# Patient Record
Sex: Male | Born: 1960 | Race: White | Hispanic: No | Smoking: Current some day smoker
Health system: Southern US, Community
[De-identification: ages and names within clinical notes are randomized; demographics above are authoritative.]

## PROBLEM LIST (undated history)

## (undated) DIAGNOSIS — R109 Unspecified abdominal pain: Secondary | ICD-10-CM

## (undated) DIAGNOSIS — M7710 Lateral epicondylitis, unspecified elbow: Secondary | ICD-10-CM

## (undated) DIAGNOSIS — T4145XA Adverse effect of unspecified anesthetic, initial encounter: Secondary | ICD-10-CM

## (undated) DIAGNOSIS — K831 Obstruction of bile duct: Secondary | ICD-10-CM

## (undated) DIAGNOSIS — I219 Acute myocardial infarction, unspecified: Secondary | ICD-10-CM

## (undated) DIAGNOSIS — F419 Anxiety disorder, unspecified: Secondary | ICD-10-CM

## (undated) DIAGNOSIS — C3490 Malignant neoplasm of unspecified part of unspecified bronchus or lung: Secondary | ICD-10-CM

## (undated) DIAGNOSIS — T8859XA Other complications of anesthesia, initial encounter: Secondary | ICD-10-CM

## (undated) DIAGNOSIS — R079 Chest pain, unspecified: Secondary | ICD-10-CM

## (undated) DIAGNOSIS — F17200 Nicotine dependence, unspecified, uncomplicated: Secondary | ICD-10-CM

## (undated) DIAGNOSIS — D539 Nutritional anemia, unspecified: Principal | ICD-10-CM

## (undated) DIAGNOSIS — R0602 Shortness of breath: Secondary | ICD-10-CM

## (undated) DIAGNOSIS — C259 Malignant neoplasm of pancreas, unspecified: Secondary | ICD-10-CM

## (undated) HISTORY — DX: Chest pain, unspecified: R07.9

## (undated) HISTORY — DX: Unspecified abdominal pain: R10.9

## (undated) HISTORY — DX: Nutritional anemia, unspecified: D53.9

## (undated) HISTORY — DX: Obstruction of bile duct: K83.1

## (undated) HISTORY — DX: Malignant neoplasm of pancreas, unspecified: C25.9

## (undated) HISTORY — PX: KNEE ARTHROSCOPY: SUR90

## (undated) HISTORY — DX: Lateral epicondylitis, unspecified elbow: M77.10

## (undated) HISTORY — PX: CHOLECYSTECTOMY: SHX55

## (undated) HISTORY — DX: Nicotine dependence, unspecified, uncomplicated: F17.200

## (undated) HISTORY — DX: Malignant neoplasm of unspecified part of unspecified bronchus or lung: C34.90

## (undated) HISTORY — PX: OTHER SURGICAL HISTORY: SHX169

---

## 2003-07-15 ENCOUNTER — Encounter: Payer: Self-pay | Admitting: *Deleted

## 2003-07-15 ENCOUNTER — Observation Stay (HOSPITAL_COMMUNITY): Admission: EM | Admit: 2003-07-15 | Discharge: 2003-07-17 | Payer: Self-pay | Admitting: Emergency Medicine

## 2003-07-17 ENCOUNTER — Encounter: Payer: Self-pay | Admitting: Cardiology

## 2005-11-04 DIAGNOSIS — I219 Acute myocardial infarction, unspecified: Secondary | ICD-10-CM

## 2005-11-04 HISTORY — DX: Acute myocardial infarction, unspecified: I21.9

## 2005-11-04 HISTORY — PX: WHIPPLE PROCEDURE: SHX2667

## 2006-08-05 ENCOUNTER — Ambulatory Visit: Admission: RE | Admit: 2006-08-05 | Discharge: 2006-10-02 | Payer: Self-pay | Admitting: *Deleted

## 2006-09-18 ENCOUNTER — Ambulatory Visit: Payer: Self-pay | Admitting: Cardiology

## 2006-09-23 ENCOUNTER — Ambulatory Visit: Payer: Self-pay | Admitting: Cardiology

## 2006-10-21 ENCOUNTER — Ambulatory Visit: Payer: Self-pay | Admitting: Cardiology

## 2007-04-07 ENCOUNTER — Ambulatory Visit (HOSPITAL_COMMUNITY): Admission: RE | Admit: 2007-04-07 | Discharge: 2007-04-07 | Payer: Self-pay | Admitting: Internal Medicine

## 2007-09-18 ENCOUNTER — Ambulatory Visit (HOSPITAL_COMMUNITY): Admission: RE | Admit: 2007-09-18 | Discharge: 2007-09-18 | Payer: Self-pay | Admitting: Internal Medicine

## 2011-03-18 ENCOUNTER — Encounter: Payer: Self-pay | Admitting: Cardiology

## 2011-03-22 NOTE — Discharge Summary (Signed)
NAME:  Patrick Lee, Patrick Lee                          ACCOUNT NO.:  [id number]   MEDICAL RECORD NO.:  [id number]                   PATIENT TYPE:  INP   LOCATION:  4728                                 FACILITY:  [facility]   PHYSICIAN:  Learta Codding, M.D.                 DATE OF BIRTH:  November 06, 1960   DATE OF ADMISSION:  07/15/2003  DATE OF DISCHARGE:  07/17/2003                           DISCHARGE SUMMARY - REFERRING   HOSPITAL COURSE:  The patient is a 50 year old male patient with a known  history of coronary artery disease who complains of substernal chest pain  and pressure when just sitting.  He also noted accompanying dyspnea.  He  took a Advertising account executive and the pain stopped.  The pain lasted approximately 10  minutes.  Afterwards, he has noted chest aching overnight.  He was seen at  [facility] and was administered sublingual nitroglycerin, and  this relieved the pain.  He now complains of weakness, but does complain of  slight aching with a 2/10 chest pain.  He currently denies any shortness of  breath, PND, orthopnea, or palpitations.   ALLERGIES:  No known drug allergies.   MEDICATIONS:  None.   PAST MEDICAL HISTORY:  Illicit substance use with lateral epicondylitis.   SOCIAL HISTORY:  He lives in __________ with his wife.  He works as a  Pensions consultant.  He is married with four children.  He smokes one pack for day  for the past 20 years.  No regular exercise.  Rare alcohol use.  Regular  diet.  He does smoke marijuana, and his last use was two to three days ago.   FAMILY HISTORY:  Mom at age 43, alive and well.  Father at age 24 disabled  secondary to his shoulder.  He did have a grandfather with coronary artery  disease.  He has two brothers and one sister alive and well.   The patient was transferred to [facility] for further cardiac  workup including a rest-stress Cardiolite which revealed no evidence of  fixed or reversible defect.  EF was 56%.   LABORATORY DATA:  Labs done during the patient's hospital stay include white  count 7.3, hemoglobin 15.5, hematocrit 45.4, platelets 219.  D-Dimer less  than 0.22.  Sodium 140, potassium 4.0, BUN 14, creatinine 1.0.  Total  cholesterol 207.  Triglycerides  163.  HDL 37, LDL 137.   Chest x-ray revealed no active disease.   The patient was discharged to home on July 17, 2003, in stable  condition.   DISCHARGE INSTRUCTIONS:  1. He was advised to stop smoking.  2. He may utilize Tylenol one to two tablets every 4-6 hours as needed for     pain.  3. Activity as tolerated.  4. A low-fat diet. He does remain on a low-fat diet, and have his     cholesterol rechecked and followed by  Dr. Christell Constant.  5.     He is to call for any questions or concerns.  6. Make a followup appointment with Dr. Christell Constant.  7. I discussed smoking cessation with him.      Guy Franco, P.A. [facility]                      Learta Codding, M.D.    LB/MEDQ  D:  08/03/2003  T:  08/03/2003  Job:  [id number]   cc:   Ernestina Penna, M.D.  [address] [place]  [place] [address]  Fax: [phone]   Learta Codding, M.D.  [address]  [address]  [place]  [place] [address]

## 2011-03-22 NOTE — H&P (Signed)
NAME:  [name], [name]                          ACCOUNT NO.:  [id number]   MEDICAL RECORD NO.:  [id number]                   PATIENT TYPE:  EMS   LOCATION:  MAJO                                 FACILITY:  [facility]   PHYSICIAN:  Learta Codding, M.D.                 DATE OF BIRTH:  1961/03/08   DATE OF ADMISSION:  07/15/2003  DATE OF DISCHARGE:                                HISTORY & PHYSICAL   REFERRING PHYSICIANS:  1. Ernestina Penna, M.D.  2. Dr. Reola Calkins at [facility].   CARDIOLOGIST:  (New) Learta Codding, M.D.   CURRENT COMPLAINT:  Substernal chest pain.   HISTORY OF PRESENT ILLNESS:  [name] is a 50 year old white male with a  history of marijuana use.  The patient has no known coronary artery disease.  He reports new onset of substernal chest pain.  His pain occurred initially  yesterday when he suddenly developed a severe tightness for approximately 10-  15 minutes across the entire precordium.  The patient received __________  with resolution of his pain.  Throughout the remainder of the day, the  patient had no recurrent chest pain.  He did have what he quotes as achiness  overnight.  This morning, he presented himself to [facility] and was given sublingual nitroglycerin.  This apparently resolved  his achiness.  The patient has had no further chest pain throughout.  He  denies shortness of breath, orthopnea, PND, palpitations, or syncope.  He  did say that the pain yesterday almost made him feel like he was going to  pass out because it was so severe.  He also reports some weakness.  His  electrocardiogram was fairly unremarkable on admission.   ALLERGIES:  No known drug allergies.   MEDICATIONS:  None.   PAST MEDICAL HISTORY:  1. History of illicit substance abuse.  2. History of lateral epicondylitis.   SOCIAL HISTORY:  The patient lives in [place], [place], with his  wife.  He is an Personnel officer.  He has  married and has four children.  He uses  marijuana on a daily basis.  He denies cocaine use.  The does not exercise.   FAMILY HISTORY:  Notable for mother alive and well at 28, as well as his  father at age 74.   REVIEW OF SYSTEMS:  No fever or chills.  No orthopnea or PND.  Positive for  chest pain as outlined above.  No frequency or dysuria.  No myalgias or  arthralgias.  No nausea or vomiting.   PHYSICAL EXAMINATION:  VITAL SIGNS:  The blood pressure is 111/70, the heart  rate is 68 beats per minute, the temperature is 97.6 degrees, and saturation  is 100% on room air.  GENERAL APPEARANCE:  A white male in no apparent distress.  HEENT:  PERRLA.  EOMI.  NECK:  Supple.  Normal carotid upstroke.  No carotid bruits.  LUNGS:  Diminished breath sounds.  HEART:  Regular rate and rhythm.  Normal sinus rhythm.  No rubs or gallops.  ABDOMEN:  Soft and nontender.  No rebound or guarding.  Good bowel sounds.  GENITOURINARY:  Deferred.  RECTAL:  Deferred.  EXTREMITIES:  No cyanosis, clubbing, or edema.  NEUROLOGIC:  The patient is alert and oriented and grossly nonfocal.   LABORATORY DATA:  Chest x-ray pending.  EKG:  Sinus rhythm, heart rate 64  beats per minute, nonspecific changes.  Labs pending.   IMPRESSION:  1. Rule out acute coronary syndrome.  The patient does have risk factors for     coronary artery disease, however, there are some typical and atypical     features to his chest pain syndrome.  The patient will be started on     aspirin and heparin.  However, if his enzymes remain negative, I would be     first inclined to proceed with exercise Cardiolite study.  If this study     is abnormal, the patient should proceed with a cardiac catheterization.  2. Substance abuse as outlined above.   DISPOSITION:  The patient will be admitted and ruled out for myocardial  infarction.  Consider cardiac catheterization if recurrent pain or positive  enzymes.                                                 Learta Codding, M.D.    GED/MEDQ  D:  07/15/2003  T:  07/15/2003  Job:  [id number]

## 2011-03-22 NOTE — Assessment & Plan Note (Signed)
[facility]                          [facility] NOTE   Patrick Lee, Patrick Lee                       MRN:          [id number]  DATE:10/21/2006                            DOB:          03/11/1961    HISTORY OF PRESENT ILLNESS:  The patient is a 50 year old male with a  history of pancreatic cancer, status post Whipple procedure.  The  patient was seen in the office November 18, 2005 with a specific  questions regarding concerns of a perioperative myocardial infarction.  The patient in the interim has had no substernal chest pain.  He also is  status post radiation therapy.  My recommendation was to obtain an  echocardiograph study but if this demonstrated normal LV function, not  to proceed with stress testing in the absence of symptoms.   The patient's echocardiographic study demonstrated indeed a normal LV  function.  There was some evidence of hypokinesis of the inferior wall  and anterior septum.  The patient however has remained asymptomatic and  from a cardiac prospective has been doing well.   MEDICATIONS:  1. Prevacid 30 mg daily.  2. Aspirin 325 daily.  3. Toprol 50 b.i.d.  4. Previous chemotherapy.   PHYSICAL EXAMINATION:  VITAL SIGNS:  Blood pressure is 98/66.  Heart  rate is 74.  Weight is 175 pounds.  NECK:  no JVD, nl carotid upstroke  LUNGS:  Clear.  HEART:  Is regular rhythm.  ABDOMEN:  Soft.  EXTREMITIES:  No edema.   PROBLEM:  1. Pancreatic cancer status post Whipple procedure.  2. Questionable perioperative myocardial infarction but normal left      ventricular function on follow up echo.  3. Asymptomatic from prospective of angina.  4. Cardiac risk factors.   PLAN:  1. The patient has remained asymptomatic from a cardiovascular      prospective.  He has no LV function on echocardiography.  2. At this point, in the absence of symptoms, I do not think the      patient needs to proceed with      ischemia testing.   He can follow up on a routine basis.  I will      notify the patient by e-mail and he can ask further questions on      line.     Learta Codding, MD,FACC  Electronically Signed    GED/MedQ  DD: 10/21/2006  DT: 10/21/2006  Job #: [id number]

## 2011-03-22 NOTE — Assessment & Plan Note (Signed)
[facility]                            [facility] C[facility] OFFICE NOTE   ENDI, Patrick Lee                       MRN:          [id number]  DATE:09/18/2006                            DOB:          July 30, 1961    REFERRING PHYSICIAN:  Ernestina Penna, M.D.   REASON FOR CONSULTATION:  Evaluation of a 50 year old male with pancreatic  cancer, complicated by perioperative myocardial infarction.   HISTORY OF PRESENT ILLNESS:  The patient is a 50 year old male status post  pancreatic cancer, requiring Whipple procedure at [facility] several weeks ago.  The patient reportedly was told  that he had a small perioperative myocardial infarction.  No further risk  stratification or stress testing was performed due to the patient's acute  illness.  However, he was told that, from a cardiac perspective, his  prognosis was good.  He was referred to his primary care doctors, who have  now referred the patient to our office to further evaluate the patient for  his recent perioperative myocardial infarction.   The patient states that things have been rough.  He is currently  undergoing chemotherapy and is finishing up radiation therapy for his  pancreatic cancer treatment.  He reports no substernal chest pain, however,  or shortness of breath at rest.  He feels at times nausea.  Has had no frank  vomiting.  Has no fever or chills.  Has no palpitations or syncope.  There  is no prior history of exertional angina.   ALLERGIES:  No known drug allergies.   MEDICATIONS:  1. Prevacid 30 mg a day.  2. Aspirin 325 daily.  3. Metoprolol 50 b.i.d.  4. Pancrease tablets 2 tablets t.i.d.  5. Dexamethasone 4 mg 1 tablet p.o. b.i.d. on day 23 post chemo.  6. Chemotherapy once a week and radiation therapy.   FAMILY HISTORY/SOCIAL HISTORY:  Reviewed.  The patient lives in [place].  He  still smokes on occasion.   REVIEW OF SYSTEMS:  As per  HPI.  No fever or chills.  No palpitations.  No  cough, orthopnea, PND.  Positive for heartburn symptoms.  No dysuria or  frequency.  No pruritus.  No problems for weakness and no melena or  hematochezia.  Remainder of review of systems negative.   PHYSICAL EXAMINATION:  VITAL SIGNS:  Blood pressure 102/68, heart rate 72  beats per minute.  NECK:  Normal carotid upstroke and no carotid bruits.  LUNGS:  Clear bilaterally.  HEART:  Regular rate and rhythm with normal S1, S2, and no murmurs, rubs, or  gallops.  ABDOMEN:  Soft and nontender with no rebound or guarding.  Good bowel  sounds.  EXTREMITIES:  No cyanosis, clubbing, or edema.  NEURO:  The patient is alert and oriented, and grossly nonfocal.   PROBLEM LIST:  1. Pancreatic cancer status post Whipple procedure.  2. Possible perioperative myocardial infarction.  3. No symptoms of angina.  4. Cardiac risk factors.   PLAN:  1. The patient will be referred for an echocardiographic study to assess  LV function.  2. In the absence of substernal chest pain or symptomatic cardiovascular      disease, and if the echocardiogram demonstrates normal LV function, I      do not think further stress testing is indicated at this point in time,      in light of the patient's poor prognosis secondary to his underlying      malignancy.  3. I discussed with the patient he needs to continue aspirin and      metoprolol.  4. I will see the patient back in 1 month to discuss test results.     Learta Codding, MD,FACC  Electronically Signed    GED/MedQ  DD: 09/18/2006  DT: 09/18/2006  Job #: [id number]   cc:   Ernestina Penna, M.D.  Lindaann Pascal, [place]

## 2011-03-26 ENCOUNTER — Encounter: Payer: Self-pay | Admitting: Cardiology

## 2011-03-26 ENCOUNTER — Encounter: Payer: Self-pay | Admitting: *Deleted

## 2011-03-26 ENCOUNTER — Ambulatory Visit (INDEPENDENT_AMBULATORY_CARE_PROVIDER_SITE_OTHER): Payer: Medicare Other | Admitting: Cardiology

## 2011-03-26 DIAGNOSIS — R0600 Dyspnea, unspecified: Secondary | ICD-10-CM

## 2011-03-26 DIAGNOSIS — R079 Chest pain, unspecified: Secondary | ICD-10-CM | POA: Insufficient documentation

## 2011-03-26 DIAGNOSIS — R0989 Other specified symptoms and signs involving the circulatory and respiratory systems: Secondary | ICD-10-CM

## 2011-03-26 DIAGNOSIS — R0602 Shortness of breath: Secondary | ICD-10-CM

## 2011-03-26 DIAGNOSIS — F17200 Nicotine dependence, unspecified, uncomplicated: Secondary | ICD-10-CM | POA: Insufficient documentation

## 2011-03-26 NOTE — Assessment & Plan Note (Signed)
I suspect this may be related to tobacco. However, I will exclude obstructive coronary disease with perfusion imaging. He would not be a little walk on a treadmill so he will need pharmacologic testing.  I will also check a BNP level.

## 2011-03-26 NOTE — Assessment & Plan Note (Signed)
This will be evaluated as above. 

## 2011-03-26 NOTE — Progress Notes (Signed)
HPI The patient presents for evaluation of shortness of breath and chest discomfort. He was seen here several years ago after possibly having a non-Q wave myocardial infarction following Whipple surgery. I do see an echocardiogram which demonstrated a preserved ejection fraction with some possible inferior wall and anterior septal hypokinesis. He was to have a stress test but because of all he was going through apparently he didn't have this.  Since his surgery he continued symptoms shortness of breath with minimal activity. He thinks that this has been slightly worse over time. He can walk level ground at about 50 yards to climb half a flight of stairs and then he has to stop to catch his breath. He does have some tightness in his chest when this happens. He will rest and this will go away. He doesn't describe neck or arm discomfort. He doesn't describe PND or orthopnea. He has had no palpitations, presyncope or syncope. He has significant fatigue limiting his activities.  No Known Allergies  Current Outpatient Prescriptions  Medication Sig Dispense Refill  . aspirin 325 MG tablet Take 325 mg by mouth daily.        Marland Kitchen escitalopram (LEXAPRO) 10 MG tablet Take 10 mg by mouth daily.        Marland Kitchen HYDROcodone-acetaminophen (VICODIN) 5-500 MG per tablet Take 1 tablet by mouth 2 (two) times daily.        . nitroGLYCERIN (NITROSTAT) 0.4 MG SL tablet Place 0.4 mg under the tongue every 5 (five) minutes as needed.          Past Medical History  Diagnosis Date  . Lateral epicondylitis   . Chest pain, unspecified   . Abdominal  pain, other specified site   . Obstruction of bile duct   . Tobacco use disorder   . Pancreatic cancer      status post Whipple procedure.    Past Surgical History  Procedure Date  . Whipple procedure   . Percutaneous extraction of gallstones     Family History  Problem Relation Age of Onset  . Stroke Father 10    Deceased    History   Social History  . Marital  Status: Married    Spouse Name: N/A    Number of Children: 4  . Years of Education: N/A   Occupational History  . UNEMPLOYED    Social History Main Topics  . Smoking status: Current Everyday Smoker -- 1.0 packs/day for 28 years    Types: Cigarettes  . Smokeless tobacco: Never Used  . Alcohol Use: Yes      Rare alcohol use  . Drug Use: Not on file     History of illicit substance abuse  . Sexually Active: Not on file   Other Topics Concern  . Not on file   Social History Narrative  . No narrative on file    ROS:  Positive for a 30 pound weight loss, tinnitus, hearing loss, depression, anxiety, weakness, dizziness, stomach aches, joint pains. Otherwise as stated in the history of present illness negative for all other systems.  PHYSICAL EXAM BP 126/83  Pulse 58  Ht 6' (1.829 m)  Wt 151 lb (68.493 kg)  BMI 20.48 kg/m2  SpO2 100% GENERAL:  Very thin HEENT:  Pupils equal round and reactive, fundi not visualized, oral mucosa unremarkable, dentures NECK:  No jugular venous distention, waveform within normal limits, carotid upstroke brisk and symmetric, no bruits, no thyromegaly LYMPHATICS:  No cervical, inguinal adenopathy LUNGS:  Clear to  auscultation bilaterally, decreased breath sounds BACK:  No CVA tenderness CHEST:  Unremarkable HEART:  PMI not displaced or sustained,S1 and S2 within normal limits, no S3, no S4, no clicks, no rubs, no murmurs ABD:  Flat, positive bowel sounds normal in frequency in pitch, no bruits, no rebound, no guarding, no midline pulsatile mass, no hepatomegaly, no splenomegaly, surgical scars. EXT:  2 plus pulses throughout, no edema, no cyanosis no clubbing SKIN:  No rashes no nodules NEURO:  Cranial nerves II through XII grossly intact, motor grossly intact throughout PSYCH:  Cognitively intact, oriented to person place and time   EKG:  Sinus rhythm, rate 59, axis within normal limits, intervals within normal limits, no acute ST-T wave  changes.  ASSESSMENT AND PLAN

## 2011-03-26 NOTE — Patient Instructions (Addendum)
   Lexiscan stress test  If the results of your test are normal or stable, you will receive a letter.  If they are abnormal, the nurse will contact you by phone. Your physician recommends that you also have lab for BNP.  Can do this on same day as test above. Follow up:  Wednesday, June 13 at 3:00 with Dr. Antoine Poche in [place] office.

## 2011-03-26 NOTE — Assessment & Plan Note (Signed)
We discussed this at length. He does have a history of depression so I will defer adding Chantix. However, I asked him to discuss this with his primary providers.

## 2011-03-27 ENCOUNTER — Other Ambulatory Visit: Payer: Self-pay | Admitting: *Deleted

## 2011-03-27 DIAGNOSIS — R0602 Shortness of breath: Secondary | ICD-10-CM

## 2011-04-10 DIAGNOSIS — R072 Precordial pain: Secondary | ICD-10-CM

## 2011-04-17 ENCOUNTER — Ambulatory Visit (INDEPENDENT_AMBULATORY_CARE_PROVIDER_SITE_OTHER): Payer: Medicare Other | Admitting: Cardiology

## 2011-04-17 ENCOUNTER — Encounter: Payer: Self-pay | Admitting: Cardiology

## 2011-04-17 DIAGNOSIS — F17200 Nicotine dependence, unspecified, uncomplicated: Secondary | ICD-10-CM

## 2011-04-17 DIAGNOSIS — R0600 Dyspnea, unspecified: Secondary | ICD-10-CM

## 2011-04-17 DIAGNOSIS — R0609 Other forms of dyspnea: Secondary | ICD-10-CM

## 2011-04-17 DIAGNOSIS — R079 Chest pain, unspecified: Secondary | ICD-10-CM

## 2011-04-17 DIAGNOSIS — I429 Cardiomyopathy, unspecified: Secondary | ICD-10-CM | POA: Insufficient documentation

## 2011-04-17 DIAGNOSIS — R0602 Shortness of breath: Secondary | ICD-10-CM

## 2011-04-17 DIAGNOSIS — I428 Other cardiomyopathies: Secondary | ICD-10-CM

## 2011-04-17 DIAGNOSIS — Z01810 Encounter for preprocedural cardiovascular examination: Secondary | ICD-10-CM

## 2011-04-17 NOTE — Assessment & Plan Note (Signed)
No further ischemia workup is needed. He needs primary risk reduction.

## 2011-04-17 NOTE — Assessment & Plan Note (Signed)
The patient will be at acceptable cardiovascular risk for the planned surgery.  I will caution his surgeons to consider that he probably has lung disease with his ongoing tobacco abuse.

## 2011-04-17 NOTE — Assessment & Plan Note (Signed)
He does understand the need to keep working on smoking cessation and I have counseled him on this.

## 2011-04-17 NOTE — Assessment & Plan Note (Signed)
His ejection fraction is said to be slightly low by nuclear study. A BNP level is pending. I will check an echocardiogram to further evaluate.

## 2011-04-17 NOTE — Progress Notes (Signed)
HPI The patient presents for evaluation of shortness of breath and chest discomfort.   He has been short of breath walking 50 feet on level ground.  I sent him for a stress perfusion study at the last visit and a BNP level. He just had a BNP drawn today. He did have a slightly low ejection fraction of 46% but no evidence of ischemia or infarct.  Since that time he has had no new cardiovascular symptoms. He is trying to cut back on his cigarettes. He will occasionally have some low blood pressures and lightheadedness. He's had no new PND or orthopnea. He said no new chest pressure, neck or arm discomfort. He is to have spine surgery.  No Known Allergies  Current Outpatient Prescriptions  Medication Sig Dispense Refill  . aspirin 325 MG tablet Take 325 mg by mouth daily.        Marland Kitchen escitalopram (LEXAPRO) 10 MG tablet Take 10 mg by mouth daily.        . nitroGLYCERIN (NITROSTAT) 0.4 MG SL tablet Place 0.4 mg under the tongue every 5 (five) minutes as needed.        Marland Kitchen DISCONTD: HYDROcodone-acetaminophen (VICODIN) 5-500 MG per tablet Take 1 tablet by mouth 2 (two) times daily.          Past Medical History  Diagnosis Date  . Lateral epicondylitis   . Chest pain, unspecified   . Abdominal  pain, other specified site   . Obstruction of bile duct   . Tobacco use disorder   . Pancreatic cancer      status post Whipple procedure.    Past Surgical History  Procedure Date  . Whipple procedure   . Percutaneous extraction of gallstones     Family History  Problem Relation Age of Onset  . Stroke Father 17    Deceased    History   Social History  . Marital Status: Married    Spouse Name: N/A    Number of Children: 4  . Years of Education: N/A   Occupational History  . UNEMPLOYED    Social History Main Topics  . Smoking status: Current Everyday Smoker -- 1.0 packs/day for 28 years    Types: Cigarettes  . Smokeless tobacco: Never Used  . Alcohol Use: Yes      Rare alcohol use  . Drug  Use: Not on file     History of illicit substance abuse  . Sexually Active: Not on file   Other Topics Concern  . Not on file   Social History Narrative  . No narrative on file    ROS:  Positive for a 30 pound weight loss, tinnitus, hearing loss, depression, anxiety, weakness, dizziness, stomach aches, joint pains. Otherwise as stated in the history of present illness negative for all other systems.  Unchanged from previous note.  PHYSICAL EXAM BP 102/70  Pulse 78  Resp 16  Ht 6' (1.829 m)  Wt 153 lb (69.4 kg)  BMI 20.75 kg/m2 GENERAL:  Very thin HEENT:  Pupils equal round and reactive, fundi not visualized, oral mucosa unremarkable, dentures NECK:  No jugular venous distention, waveform within normal limits, carotid upstroke brisk and symmetric, no bruits, no thyromegaly LYMPHATICS:  No cervical, inguinal adenopathy LUNGS:  Clear to auscultation bilaterally, decreased breath sounds BACK:  No CVA tenderness CHEST:  Unremarkable HEART:  PMI not displaced or sustained,S1 and S2 within normal limits, no S3, no S4, no clicks, no rubs, no murmurs ABD:  Flat, positive bowel  sounds normal in frequency in pitch, no bruits, no rebound, no guarding, no midline pulsatile mass, no hepatomegaly, no splenomegaly, surgical scars. EXT:  2 plus pulses throughout, no edema, no cyanosis no clubbing SKIN:  No rashes no nodules NEURO:  Cranial nerves II through XII grossly intact, motor grossly intact throughout PSYCH:  Cognitively intact, oriented to person place and time   EKG:  Sinus rhythm, rate 59, axis within normal limits, intervals within normal limits, no acute ST-T wave changes.  ASSESSMENT AND PLAN

## 2011-04-17 NOTE — Patient Instructions (Addendum)
You are being scheduled for a 2 D Echo which is an ultrasound to look at your heart function and valves.  Your appointment is scheduled for Friday June 15 at 11am.  Please arrive at 10:30 am at the outpatient admitting area at [facility].  You will be called with the results. Please continue your current medications. Follow up will be based on the results of the testing.

## 2011-04-17 NOTE — Assessment & Plan Note (Signed)
I suspect this is related to tobacco but I will followup BNP and echo. If these are unremarkable I will encourage followup with a pulmonologist.

## 2011-04-18 ENCOUNTER — Other Ambulatory Visit: Payer: Self-pay | Admitting: *Deleted

## 2011-04-18 ENCOUNTER — Other Ambulatory Visit (HOSPITAL_COMMUNITY): Payer: Medicare Other | Admitting: Radiology

## 2011-04-18 DIAGNOSIS — R0602 Shortness of breath: Secondary | ICD-10-CM

## 2011-04-19 DIAGNOSIS — R0602 Shortness of breath: Secondary | ICD-10-CM

## 2011-04-22 ENCOUNTER — Encounter: Payer: Self-pay | Admitting: Cardiology

## 2011-04-30 ENCOUNTER — Telehealth: Payer: Self-pay | Admitting: *Deleted

## 2011-04-30 DIAGNOSIS — R079 Chest pain, unspecified: Secondary | ICD-10-CM

## 2011-04-30 DIAGNOSIS — R0602 Shortness of breath: Secondary | ICD-10-CM

## 2011-04-30 NOTE — Telephone Encounter (Signed)
Pt notified of results of echo. He states he is still having SOB and chest pain with exertion. He wants to know if he needs to see lung MD as d/w Dr. Antoine Poche and if he needs to f/u with Dr. Antoine Poche.

## 2011-04-30 NOTE — Telephone Encounter (Signed)
Message copied by Arlyss Gandy on Tue Apr 30, 2011  3:09 PM ------      Message from: Arlyss Gandy      Created: Tue Apr 30, 2011  3:07 PM       Pt notified of results and verbalized understanding.

## 2011-05-01 NOTE — Telephone Encounter (Signed)
Pt agreeable to see Dr. Orson Aloe.  Spoke with Dr. Donnamarie Poag staff. They do not have his Aug schedule yet. They will contact pt to set up appt.

## 2011-05-01 NOTE — Telephone Encounter (Signed)
OK to set up an appt with pulmonary.  I see no overt cardiovascular cause for dyspnea.

## 2012-01-14 ENCOUNTER — Other Ambulatory Visit (HOSPITAL_COMMUNITY): Payer: Self-pay | Admitting: Optometry

## 2012-01-14 DIAGNOSIS — J984 Other disorders of lung: Secondary | ICD-10-CM

## 2012-01-23 ENCOUNTER — Ambulatory Visit (HOSPITAL_COMMUNITY)
Admission: RE | Admit: 2012-01-23 | Discharge: 2012-01-23 | Disposition: A | Discharge status: Home / Self Care | Payer: Medicare Other | Source: Ambulatory Visit | Attending: Optometry | Admitting: Optometry

## 2012-01-23 ENCOUNTER — Encounter (HOSPITAL_COMMUNITY): Payer: Self-pay

## 2012-01-23 DIAGNOSIS — R222 Localized swelling, mass and lump, trunk: Secondary | ICD-10-CM | POA: Insufficient documentation

## 2012-01-23 DIAGNOSIS — J984 Other disorders of lung: Secondary | ICD-10-CM | POA: Insufficient documentation

## 2012-01-23 LAB — GLUCOSE, CAPILLARY: Glucose-Capillary: 104 mg/dL — ABNORMAL HIGH (ref 70–99)

## 2012-01-23 MED ORDER — FLUDEOXYGLUCOSE F - 18 (FDG) INJECTION
19.5000 | Freq: Once | INTRAVENOUS | Status: AC | PRN
  Administered 2012-01-23: 19.5 via INTRAVENOUS

## 2012-03-25 ENCOUNTER — Institutional Professional Consult (permissible substitution) (INDEPENDENT_AMBULATORY_CARE_PROVIDER_SITE_OTHER): Payer: Medicare Other | Admitting: Surgery

## 2012-03-25 ENCOUNTER — Encounter: Payer: Self-pay | Admitting: Surgery

## 2012-03-25 VITALS — BP 113/76 | HR 90 | Temp 98.0°F | Resp 16 | Ht 72.0 in | Wt 151.0 lb

## 2012-03-25 DIAGNOSIS — D381 Neoplasm of uncertain behavior of trachea, bronchus and lung: Secondary | ICD-10-CM

## 2012-03-25 NOTE — Progress Notes (Signed)
                  [address]            [place] [address]          [phone]       PCP is MOORE, DONALD, MD, Referring Provider: William Henderson, MD    Chief Complaint   Patient presents with   .  Lung Lesion       referred by Dr. William Henderson...CT [facility] 03/23/12...Pet 01/23/12        HPI:   The patient is a 51-year-old who smoked until just recently who has a history of pancreatic cancer status post Whipple procedure at [facility] about 7 years ago. He said that he had positive lymph nodes and was treated with chemotherapy postoperatively. He has continued to do fairly well without evidence of recurrent cancer although he has had some bile duct problems. He said that he had a CT scan the chest last summer at [facility] that showed a cavitary lesion in the right lower lobe measuring 1.2 x 0.8 cm. He said he underwent a needle biopsy which was negative. He had a followup CT scan in October 2012 which continued to show this lesion which was new from previous scans in 2008. In December 2012 he underwent bronchoscopy with bronchial washings which showed acute inflammatory cells but no tumor identified. He underwent a PET scan on 01/23/2012 which showed mild FDG uptake within the cavitary superior segment lesion in the right lower lobe with a maximum SUV of 2.2 compared to the background of 1.4. There were no other abnormal areas of FDG uptake. A followup CT scan the chest was done on 03/23/2012 which continued to show the spiculated cavitary lesion in the right lower lobe which had not increased in size compared to the CT scan on 12/25/2011. However, it had increased in size compared to the CT scan from 08/22/2011. The lesion is now 1.5 x 1.2 cm.    Past Medical History   Diagnosis  Date   .  Lateral epicondylitis     .  Chest pain, unspecified     .  Abdominal  pain, other specified site     .  Obstruction of bile duct     .  Current  smoker     .  Pancreatic cancer          status post Whipple procedure.         Past Surgical History   Procedure  Date   .  Whipple procedure     .  Percutaneous extraction of gallstones           Family History   Problem  Relation  Age of Onset   .  Stroke  Father  62       Deceased        Social History History   Substance Use Topics   .  Smoking status:  Current Everyday Smoker -- 1.0 packs/day for 28 years       Types:  Cigarettes   .  Smokeless tobacco:  Never Used   .  Alcohol Use:  Yes          Rare alcohol use         Current Outpatient Prescriptions   Medication  Sig  Dispense  Refill   .  aspirin 325 MG tablet  Take 325 mg by mouth daily.           .    escitalopram (LEXAPRO) 10 MG tablet  Take 10 mg by mouth daily.           .  nitroGLYCERIN (NITROSTAT) 0.4 MG SL tablet  Place 0.4 mg under the tongue every 5 (five) minutes as needed.                No Known Allergies   Review of Systems  Constitutional: Negative for fever, chills, diaphoresis, activity change, appetite change, fatigue and unexpected weight change.  HENT: Negative.   Eyes: Negative.   Respiratory: Positive for chest tightness and shortness of breath. Negative for cough and wheezing.   Cardiovascular: Negative for chest pain, palpitations and leg swelling.  Gastrointestinal: Positive for abdominal pain and diarrhea. Negative for nausea, vomiting, constipation, blood in stool and anal bleeding.  Genitourinary: Negative.   Musculoskeletal: Positive for arthralgias.  Skin: Negative.   Neurological: Negative.   Hematological: Negative.   Psychiatric/Behavioral: The patient is nervous/anxious.       BP 113/76  Pulse 90  Temp(Src) 98 F (36.7 C) (Oral)  Resp 16  Ht 6' (1.829 m)  Wt 151 lb (68.493 kg)  BMI 20.48 kg/m2  SpO2 98% Physical Exam  Constitutional: He is oriented to person, place, and time. He appears well-developed and well-nourished. No distress.  HENT:     Head: Normocephalic and atraumatic.   Mouth/Throat: Oropharynx is clear and moist.  Eyes: Conjunctivae and EOM are normal. No scleral icterus.  Neck: Normal range of motion. Neck supple. No JVD present. No tracheal deviation present. No thyromegaly present.  Cardiovascular: Normal rate, regular rhythm, normal heart sounds and intact distal pulses.  Exam reveals no gallop and no friction rub.    No murmur heard. Pulmonary/Chest: Effort normal and breath sounds normal. No respiratory distress. He has no wheezes. He has no rales. He exhibits no tenderness.  Abdominal: Soft. Bowel sounds are normal. He exhibits no distension and no mass. There is no tenderness.       Well-healed laparotomy scar.  Musculoskeletal: Normal range of motion. He exhibits no edema and no tenderness.  Lymphadenopathy:    He has no cervical adenopathy.  Neurological: He is alert and oriented to person, place, and time. He has normal strength. No cranial nerve deficit or sensory deficit.  Skin: Skin is warm and dry.  Psychiatric: He has a normal mood and affect.            *RADIOLOGY REPORT*   Clinical Data:  Initial treatment strategy for right lower lobe pulmonary nodule.  Remote history of Whipple procedure for pancreatic cancer.   NUCLEAR MEDICINE PET CT INITIAL (PI) SKULL BASE TO THIGH   Technique:  19.5 mCi F-18 FDG was injected intravenously via the right antecubital IV site.  Full-ring PET imaging was performed from the skull base through the mid-thighs 60  minutes after injection.  CT data was obtained and used for attenuation correction and anatomic localization only.  (This was not acquired as a diagnostic CT examination.)   Fasting Blood Glucose:  104   Patient Weight:  152 pounds.   Comparison:  PET CT 09/18/2007, chest CT 12/25/2011   Findings: Left greater than right F D G uptake within the paraspinal muscles at the level of C3 is most likely due to motion or volume averaging with  adjacent vascular uptake.  No measurable corresponding mass is seen on CT images.  There is minimal FDG uptake within the cavitary superior segment right lower lobe pulmonary nodule, S U V maximum   2.2 (background F D G uptake measures S U V maximum 1.4).   No abnormal F D G uptake is noted elsewhere in the chest, abdomen, or pelvis.  Physiologic uptake is noted within the base of brain, myocardium, bowel, and nondilated renal collecting systems.   Specifically, there is no abnormal F D G uptake within the pancreatic bed, with evidence of prior Whipple procedure.   Review of CT images obtained for attenuation correction purposes redemonstrates the previously seen superior segment right lower lobe cavitary nodule.  No new abnormality is noted in the chest, abdomen, or pelvis.   IMPRESSION: Minimal FDG uptake within the right lower lobe cavitary pulmonary nodule.  This may be seen with benign etiologies such as infection (including fungal), pulmonary emboli, or autoimmune  diseases, however low grade malignancy/adenocarcinoma could have a similar appearance.  Continued close follow-up is recommended.  Chest CT could be performed in 3-6 months.   Original Report Authenticated By: GRETCHEN E. GREEN, M.D.     Impression:   He has a 1.5 x 1.2 cm spiculated cavitary nodule in the superior segment of the right lower lobe that is suspicious for lung cancer. He has mild hypermetabolic activity on PET scan in March 2013. It has increased in size from October 2012 to March 2013. It has not increased in size from March until now but it has only been 2 months. I am concerned this is a slow growing adenocarcinoma in this gentleman with a heavy smoking history. I think it would be best to proceed with thoracotomy for wedge resection of this superior segmental lesion. If frozen section shows cancer then I would plan to proceed with right lower lobectomy and mediastinal lymph node dissection. I  discussed this recommendation with the patient and his wife including the alternative of continuing to follow this with periodic CT scanning. I think the lesion is too small for a reliable CT-guided needle biopsy. His pulmonary function testing from September 2012 show mild airway obstruction with an FEV1 of 3.13 and a diffusion capacity of 60% predicted. I discussed the operative procedure with the patient and his wife including benefits and risks including but not limited to bleeding, infection, prolonged airleak, bronchial stump complications, respiratory insufficiency, and the possibility that this may not be a cancer. He understands and agrees to proceed.   Plan:   We will schedule flexible fiberoptic bronchoscopy, right thoracotomy for wedge resection of the right lower lobe lesion and possible right lower lobectomy on Wednesday, April 08, 2012.      

## 2012-03-26 ENCOUNTER — Other Ambulatory Visit: Payer: Self-pay

## 2012-03-26 DIAGNOSIS — D381 Neoplasm of uncertain behavior of trachea, bronchus and lung: Secondary | ICD-10-CM

## 2012-03-27 ENCOUNTER — Encounter (HOSPITAL_COMMUNITY): Payer: Self-pay | Admitting: Pharmacy Technician

## 2012-03-31 ENCOUNTER — Encounter (HOSPITAL_COMMUNITY): Payer: Self-pay

## 2012-03-31 ENCOUNTER — Inpatient Hospital Stay (HOSPITAL_COMMUNITY): Admission: RE | Admit: 2012-03-31 | Discharge: 2012-03-31 | Payer: Medicare Other | Source: Ambulatory Visit

## 2012-03-31 HISTORY — DX: Anxiety disorder, unspecified: F41.9

## 2012-03-31 HISTORY — DX: Adverse effect of unspecified anesthetic, initial encounter: T41.45XA

## 2012-03-31 HISTORY — DX: Acute myocardial infarction, unspecified: I21.9

## 2012-03-31 HISTORY — DX: Other complications of anesthesia, initial encounter: T88.59XA

## 2012-03-31 HISTORY — DX: Shortness of breath: R06.02

## 2012-03-31 NOTE — Pre-Procedure Instructions (Signed)
20 DONTAI PEMBER  03/31/2012   Your procedure is scheduled on:  04/08/2012  Report to [facility] at 6:30 AM.  Call this number if you have problems the morning of surgery: [phone]   Remember:   Do not eat food:After Midnight.  May have clear liquids: up to 4 Hours before arrival.  Clear liquids include soda, tea, black coffee, apple or grape juice, broth.  Take these medicines the morning of surgery with A SIP OF WATER: clonazepam as needed   Do not wear jewelry, make-up or nail polish.  Do not wear lotions, powders, or perfumes. You may wear deodorant.  Do not shave 48 hours prior to surgery. Men may shave face and neck.  Do not bring valuables to the hospital.  Contacts, dentures or bridgework may not be worn into surgery.  Leave suitcase in the car. After surgery it may be brought to your room.  For patients admitted to the hospital, checkout time is 11:00 AM the day of discharge.   Patients discharged the day of surgery will not be allowed to drive home.  Name and phone number of your driver: with family  Special Instructions: CHG Shower Use Special Wash: 1/2 bottle night before surgery and 1/2 bottle morning of surgery.   Please read over the following fact sheets that you were given: Pain Booklet, Coughing and Deep Breathing, Blood Transfusion Information, MRSA Information and Surgical Site Infection Prevention

## 2012-03-31 NOTE — Progress Notes (Signed)
Spoke with Jaynie Collins, PAC, regarding pt. Report of MI during Whipple Procedure at [facility] in 2007.   Requested by fax fr [facility] the D/C summary of that hosp. & anesth. record.

## 2012-04-02 NOTE — Consult Note (Addendum)
Anesthesia Chart Review:  Patient is a 51 year old male scheduled for bronchoscopy, right thoracotomy, RLL wedge resection versus lobectomy on 04/08/12 by Dr. Laneta Simmers.  PCP is Dr. Rudi Heap.  Pulmonologist is Dr. Cherie Ouch in [place].    History includes pancreatic cancer s/p Whipple procedure at [facility] on 06/25/06 with positive lymph nodes and was treated with chemotherapy.  He reported being told that he had a small perioperative MI.  [facility] did not send the discharge summary, but Anesthesia records indicate he had tachycardia and T wave inversion in lead II and V5 that resolved after treatment with esmolol and phenylephrine.)  After that procedure he was referred to Cardiologist Dr. Peyton Bottoms in November 2007.  Echo was done then and showed normal LV function and some evidence of hypokinesis of the inferior wall and anterior septum.  No further testing was recommended.  Since then he was re-evaluated by Cardiologist Dr. Rollene Rotunda in May and June of 2012 for chest discomfort and dyspnea.  He was referred for a nuclear stress test that was done on 04/10/11 [facility]) and showed EF 46%, but no evidence of ischemia or infarct.  An echo was repeated on 04/19/11 [facility]) and showed LV chamber size is normal, no LVH, global hypokinesis of the LV with minor regional variation, LV systolic function at the lower limits of normal, EF 50-55%, normal LV diastolic filling, mild to moderate MR, no evidence of AR, mean gradient of the AV is 3 mmHG, normal RV systolic function, mild TR.  After these tests were completed Dr. Antoine Poche did not recommend any further ischemic work-up and cleared him for spine surgery (see 04/17/11 notes in Epic), which I don't believe he ended up having.  However, he did ultimately see Pulmonologist Dr. Orson Aloe for further evaluation of his dyspnea and was found to have a RLL lung lesion with bronchoscopy showing acute inflammatory cells but no tumor.  However,  there was abnormal uptake on PET scan and follow-up CT scan showed an increase in size, and he was referred to Dr. Laneta Simmers for further evaluation and definitive diagnosis.    His PAT visit is on 04/03/12.  I will be out of town that day.  His PAT RN can alert one of our Anesthesiologists of any immediate concerns during his visit, otherwise, I'll follow-up on his results on 04/06/12.    Shonna Chock, PA-C 04/02/12      Addendum: 04/06/12 1600  04/03/12 labs noted.  EKG from 04/03/12 showed NSR, possible LAE.  It was not felt significantly changed from his EKG on 07/15/03.  He is for a CXR on the day of surgery.

## 2012-04-03 ENCOUNTER — Encounter (HOSPITAL_COMMUNITY)
Admission: RE | Admit: 2012-04-03 | Discharge: 2012-04-03 | Disposition: A | Discharge status: Home / Self Care | Payer: Medicare Other | Source: Ambulatory Visit | Attending: Surgery | Admitting: Surgery

## 2012-04-03 VITALS — BP 116/71 | HR 81 | Temp 97.6°F | Resp 18 | Ht 72.0 in | Wt 158.1 lb

## 2012-04-03 DIAGNOSIS — D381 Neoplasm of uncertain behavior of trachea, bronchus and lung: Secondary | ICD-10-CM

## 2012-04-03 LAB — URINALYSIS, ROUTINE W REFLEX MICROSCOPIC
Bilirubin Urine: NEGATIVE
Hgb urine dipstick: NEGATIVE
Ketones, ur: NEGATIVE mg/dL
Nitrite: NEGATIVE
pH: 5 (ref 5.0–8.0)

## 2012-04-03 LAB — COMPREHENSIVE METABOLIC PANEL
Albumin: 3.2 g/dL — ABNORMAL LOW (ref 3.5–5.2)
BUN: 7 mg/dL (ref 6–23)
Creatinine, Ser: 0.85 mg/dL (ref 0.50–1.35)
GFR calc Af Amer: >90 mL/min (ref >90)
Total Bilirubin: 0.2 mg/dL — ABNORMAL LOW (ref 0.3–1.2)
Total Protein: 5.5 g/dL — ABNORMAL LOW (ref 6.0–8.3)

## 2012-04-03 LAB — CBC
HCT: 37.3 % — ABNORMAL LOW (ref 39.0–52.0)
MCHC: 34.3 g/dL (ref 30.0–36.0)
MCV: 99.5 fL (ref 78.0–100.0)
RDW: 12.8 % (ref 11.5–15.5)

## 2012-04-03 LAB — PROTIME-INR
INR: 1.1 (ref 0.00–1.49)
Prothrombin Time: 14.4 seconds (ref 11.6–15.2)

## 2012-04-03 LAB — BLOOD GAS, ARTERIAL
Acid-base deficit: 1 mmol/L (ref 0.0–2.0)
O2 Saturation: 98 %
Patient temperature: 98.6
TCO2: 24 mmol/L (ref 0–100)

## 2012-04-03 LAB — TYPE AND SCREEN

## 2012-04-03 LAB — ABO/RH: ABO/RH(D): A POS

## 2012-04-03 NOTE — Progress Notes (Signed)
@   lab appointment pt denies any new medical concerns and denies any chest discomfort or shortness of breath

## 2012-04-06 ENCOUNTER — Inpatient Hospital Stay (HOSPITAL_COMMUNITY): Admission: RE | Admit: 2012-04-06 | Payer: Medicare Other | Source: Ambulatory Visit

## 2012-04-06 ENCOUNTER — Inpatient Hospital Stay (HOSPITAL_COMMUNITY): Admission: RE | Admit: 2012-04-06 | Discharge: 2012-04-06 | Payer: Medicare Other | Source: Ambulatory Visit

## 2012-04-07 MED ORDER — DEXTROSE 5 % IV SOLN
1.5000 g | INTRAVENOUS | Status: AC
  Administered 2012-04-08: 1.5 g via INTRAVENOUS
  Filled 2012-04-07: qty 1.5

## 2012-04-07 NOTE — H&P (Signed)
[address]            [name] [address]          [phone]       PCP is Rudi Heap, MD, Referring Provider: Cherie Ouch, MD    Chief Complaint   Patient presents with   .  Lung Lesion       referred by Dr. Cherie Ouch...CT [facility] 03/23/12.Marland KitchenMarland KitchenPet 01/23/12        HPI:   The patient is a 51 year old who smoked until just recently who has a history of pancreatic cancer status post Whipple procedure at [facility] about 7 years ago. He said that he had positive lymph nodes and was treated with chemotherapy postoperatively. He has continued to do fairly well without evidence of recurrent cancer although he has had some bile duct problems. He said that he had a CT scan the chest last summer at [facility] that showed a cavitary lesion in the right lower lobe measuring 1.2 x 0.8 cm. He said he underwent a needle biopsy which was negative. He had a followup CT scan in October 2012 which continued to show this lesion which was new from previous scans in 2008. In December 2012 he underwent bronchoscopy with bronchial washings which showed acute inflammatory cells but no tumor identified. He underwent a PET scan on 01/23/2012 which showed mild FDG uptake within the cavitary superior segment lesion in the right lower lobe with a maximum SUV of 2.2 compared to the background of 1.4. There were no other abnormal areas of FDG uptake. A followup CT scan the chest was done on 03/23/2012 which continued to show the spiculated cavitary lesion in the right lower lobe which had not increased in size compared to the CT scan on 12/25/2011. However, it had increased in size compared to the CT scan from 08/22/2011. The lesion is now 1.5 x 1.2 cm.    Past Medical History   Diagnosis  Date   .  Lateral epicondylitis     .  Chest pain, unspecified     .  Abdominal  pain, other specified site     .  Obstruction of bile duct     .  Current  smoker     .  Pancreatic cancer          status post Whipple procedure.         Past Surgical History   Procedure  Date   .  Whipple procedure     .  Percutaneous extraction of gallstones           Family History   Problem  Relation  Age of Onset   .  Stroke  Father  49       Deceased        Social History History   Substance Use Topics   .  Smoking status:  Current Everyday Smoker -- 1.0 packs/day for 28 years       Types:  Cigarettes   .  Smokeless tobacco:  Never Used   .  Alcohol Use:  Yes          Rare alcohol use         Current Outpatient Prescriptions   Medication  Sig  Dispense  Refill   .  aspirin 325 MG tablet  Take 325 mg by mouth daily.           Marland Kitchen  escitalopram (LEXAPRO) 10 MG tablet  Take 10 mg by mouth daily.           .  nitroGLYCERIN (NITROSTAT) 0.4 MG SL tablet  Place 0.4 mg under the tongue every 5 (five) minutes as needed.                No Known Allergies   Review of Systems  Constitutional: Negative for fever, chills, diaphoresis, activity change, appetite change, fatigue and unexpected weight change.  HENT: Negative.   Eyes: Negative.   Respiratory: Positive for chest tightness and shortness of breath. Negative for cough and wheezing.   Cardiovascular: Negative for chest pain, palpitations and leg swelling.  Gastrointestinal: Positive for abdominal pain and diarrhea. Negative for nausea, vomiting, constipation, blood in stool and anal bleeding.  Genitourinary: Negative.   Musculoskeletal: Positive for arthralgias.  Skin: Negative.   Neurological: Negative.   Hematological: Negative.   Psychiatric/Behavioral: The patient is nervous/anxious.       BP 113/76  Pulse 90  Temp(Src) 98 F (36.7 C) (Oral)  Resp 16  Ht 6' (1.829 m)  Wt 151 lb (68.493 kg)  BMI 20.48 kg/m2  SpO2 98% Physical Exam  Constitutional: He is oriented to person, place, and time. He appears well-developed and well-nourished. No distress.  HENT:     Head: Normocephalic and atraumatic.   Mouth/Throat: Oropharynx is clear and moist.  Eyes: Conjunctivae and EOM are normal. No scleral icterus.  Neck: Normal range of motion. Neck supple. No JVD present. No tracheal deviation present. No thyromegaly present.  Cardiovascular: Normal rate, regular rhythm, normal heart sounds and intact distal pulses.  Exam reveals no gallop and no friction rub.    No murmur heard. Pulmonary/Chest: Effort normal and breath sounds normal. No respiratory distress. He has no wheezes. He has no rales. He exhibits no tenderness.  Abdominal: Soft. Bowel sounds are normal. He exhibits no distension and no mass. There is no tenderness.       Well-healed laparotomy scar.  Musculoskeletal: Normal range of motion. He exhibits no edema and no tenderness.  Lymphadenopathy:    He has no cervical adenopathy.  Neurological: He is alert and oriented to person, place, and time. He has normal strength. No cranial nerve deficit or sensory deficit.  Skin: Skin is warm and dry.  Psychiatric: He has a normal mood and affect.            *RADIOLOGY REPORT*   Clinical Data:  Initial treatment strategy for right lower lobe pulmonary nodule.  Remote history of Whipple procedure for pancreatic cancer.   NUCLEAR MEDICINE PET CT INITIAL (PI) SKULL BASE TO THIGH   Technique:  19.5 mCi F-18 FDG was injected intravenously via the right antecubital IV site.  Full-ring PET imaging was performed from the skull base through the mid-thighs 60  minutes after injection.  CT data was obtained and used for attenuation correction and anatomic localization only.  (This was not acquired as a diagnostic CT examination.)   Fasting Blood Glucose:  104   Patient Weight:  152 pounds.   Comparison:  PET CT 09/18/2007, chest CT 12/25/2011   Findings: Left greater than right F D G uptake within the paraspinal muscles at the level of C3 is most likely due to motion or volume averaging with  adjacent vascular uptake.  No measurable corresponding mass is seen on CT images.  There is minimal FDG uptake within the cavitary superior segment right lower lobe pulmonary nodule, S U V maximum  2.2 (background F D G uptake measures S U V maximum 1.4).   No abnormal F D G uptake is noted elsewhere in the chest, abdomen, or pelvis.  Physiologic uptake is noted within the base of brain, myocardium, bowel, and nondilated renal collecting systems.   Specifically, there is no abnormal F D G uptake within the pancreatic bed, with evidence of prior Whipple procedure.   Review of CT images obtained for attenuation correction purposes redemonstrates the previously seen superior segment right lower lobe cavitary nodule.  No new abnormality is noted in the chest, abdomen, or pelvis.   IMPRESSION: Minimal FDG uptake within the right lower lobe cavitary pulmonary nodule.  This may be seen with benign etiologies such as infection (including fungal), pulmonary emboli, or autoimmune  diseases, however low grade malignancy/adenocarcinoma could have a similar appearance.  Continued close follow-up is recommended.  Chest CT could be performed in 3-6 months.   Original Report Authenticated By: Harrel Lemon, M.D.     Impression:   He has a 1.5 x 1.2 cm spiculated cavitary nodule in the superior segment of the right lower lobe that is suspicious for lung cancer. He has mild hypermetabolic activity on PET scan in March 2013. It has increased in size from October 2012 to March 2013. It has not increased in size from March until now but it has only been 2 months. I am concerned this is a slow growing adenocarcinoma in this gentleman with a heavy smoking history. I think it would be best to proceed with thoracotomy for wedge resection of this superior segmental lesion. If frozen section shows cancer then I would plan to proceed with right lower lobectomy and mediastinal lymph node dissection. I  discussed this recommendation with the patient and his wife including the alternative of continuing to follow this with periodic CT scanning. I think the lesion is too small for a reliable CT-guided needle biopsy. His pulmonary function testing from September 2012 show mild airway obstruction with an FEV1 of 3.13 and a diffusion capacity of 60% predicted. I discussed the operative procedure with the patient and his wife including benefits and risks including but not limited to bleeding, infection, prolonged airleak, bronchial stump complications, respiratory insufficiency, and the possibility that this may not be a cancer. He understands and agrees to proceed.   Plan:   We will schedule flexible fiberoptic bronchoscopy, right thoracotomy for wedge resection of the right lower lobe lesion and possible right lower lobectomy on Wednesday, April 08, 2012.

## 2012-04-08 ENCOUNTER — Inpatient Hospital Stay (HOSPITAL_COMMUNITY)
Admission: RE | Admit: 2012-04-08 | Discharge: 2012-04-13 | DRG: 164 | Disposition: A | Discharge status: Home / Self Care | Payer: Medicare Other | Source: Ambulatory Visit | Attending: Surgery | Admitting: Surgery

## 2012-04-08 ENCOUNTER — Encounter (HOSPITAL_COMMUNITY): Payer: Self-pay | Admitting: Vascular Surgery

## 2012-04-08 ENCOUNTER — Encounter (HOSPITAL_COMMUNITY): Payer: Self-pay | Admitting: *Deleted

## 2012-04-08 ENCOUNTER — Ambulatory Visit (HOSPITAL_COMMUNITY): Payer: Medicare Other

## 2012-04-08 ENCOUNTER — Inpatient Hospital Stay (HOSPITAL_COMMUNITY): Payer: Medicare Other

## 2012-04-08 ENCOUNTER — Ambulatory Visit (HOSPITAL_COMMUNITY): Payer: Medicare Other | Admitting: Vascular Surgery

## 2012-04-08 ENCOUNTER — Encounter (HOSPITAL_COMMUNITY)
Admission: RE | Disposition: A | Discharge status: Home / Self Care | Payer: Self-pay | Source: Ambulatory Visit | Attending: Surgery

## 2012-04-08 DIAGNOSIS — Z7982 Long term (current) use of aspirin: Secondary | ICD-10-CM

## 2012-04-08 DIAGNOSIS — J90 Pleural effusion, not elsewhere classified: Secondary | ICD-10-CM | POA: Diagnosis not present

## 2012-04-08 DIAGNOSIS — C3430 Malignant neoplasm of lower lobe, unspecified bronchus or lung: Principal | ICD-10-CM | POA: Diagnosis present

## 2012-04-08 DIAGNOSIS — Z01812 Encounter for preprocedural laboratory examination: Secondary | ICD-10-CM

## 2012-04-08 DIAGNOSIS — Z823 Family history of stroke: Secondary | ICD-10-CM

## 2012-04-08 DIAGNOSIS — Z87891 Personal history of nicotine dependence: Secondary | ICD-10-CM

## 2012-04-08 DIAGNOSIS — J9819 Other pulmonary collapse: Secondary | ICD-10-CM | POA: Diagnosis not present

## 2012-04-08 DIAGNOSIS — Z79899 Other long term (current) drug therapy: Secondary | ICD-10-CM

## 2012-04-08 DIAGNOSIS — Z9221 Personal history of antineoplastic chemotherapy: Secondary | ICD-10-CM

## 2012-04-08 DIAGNOSIS — Z8509 Personal history of malignant neoplasm of other digestive organs: Secondary | ICD-10-CM

## 2012-04-08 DIAGNOSIS — D381 Neoplasm of uncertain behavior of trachea, bronchus and lung: Secondary | ICD-10-CM

## 2012-04-08 HISTORY — PX: VIDEO BRONCHOSCOPY: SHX5072

## 2012-04-08 LAB — POCT I-STAT 7, (LYTES, BLD GAS, ICA,H+H)
Acid-base deficit: 1 mmol/L (ref 0.0–2.0)
Bicarbonate: 23.3 mEq/L (ref 20.0–24.0)
HCT: 42 % (ref 39.0–52.0)
O2 Saturation: 99 %
Sodium: 139 mEq/L (ref 135–145)
pO2, Arterial: 125 mmHg — ABNORMAL HIGH (ref 80.0–100.0)

## 2012-04-08 SURGERY — LOBECTOMY, LUNG, OPEN
Anesthesia: General | Site: Chest | Laterality: Right | Wound class: Clean Contaminated

## 2012-04-08 MED ORDER — NEOSTIGMINE METHYLSULFATE 1 MG/ML IJ SOLN
INTRAMUSCULAR | Status: DC | PRN
  Administered 2012-04-08 (×2): 2.5 mg via INTRAVENOUS

## 2012-04-08 MED ORDER — SENNOSIDES-DOCUSATE SODIUM 8.6-50 MG PO TABS
1.0000 | ORAL_TABLET | Freq: Every evening | ORAL | Status: DC | PRN
  Filled 2012-04-08: qty 1

## 2012-04-08 MED ORDER — SUFENTANIL CITRATE 50 MCG/ML IV SOLN
INTRAVENOUS | Status: DC | PRN
  Administered 2012-04-08: 10 ug via INTRAVENOUS
  Administered 2012-04-08: 15 ug via INTRAVENOUS
  Administered 2012-04-08: 5 ug via INTRAVENOUS
  Administered 2012-04-08: 15 ug via INTRAVENOUS
  Administered 2012-04-08: 5 ug via INTRAVENOUS

## 2012-04-08 MED ORDER — HYDROMORPHONE HCL PF 1 MG/ML IJ SOLN
0.2500 mg | INTRAMUSCULAR | Status: DC | PRN
  Administered 2012-04-08 (×3): 0.5 mg via INTRAVENOUS

## 2012-04-08 MED ORDER — BUPIVACAINE 0.5 % ON-Q PUMP SINGLE CATH 400 ML
INJECTION | Status: DC | PRN
  Administered 2012-04-08: 400 mL

## 2012-04-08 MED ORDER — 0.9 % SODIUM CHLORIDE (POUR BTL) OPTIME
TOPICAL | Status: DC | PRN
  Administered 2012-04-08: 1000 mL

## 2012-04-08 MED ORDER — ROCURONIUM BROMIDE 100 MG/10ML IV SOLN
INTRAVENOUS | Status: DC | PRN
  Administered 2012-04-08: 70 mg via INTRAVENOUS
  Administered 2012-04-08 (×4): 10 mg via INTRAVENOUS

## 2012-04-08 MED ORDER — SODIUM CHLORIDE 0.9 % IJ SOLN: 9.0000 mL | INTRAMUSCULAR | Status: DC | PRN

## 2012-04-08 MED ORDER — FENTANYL 10 MCG/ML IV SOLN
INTRAVENOUS | Status: DC
  Administered 2012-04-08: 300 ug via INTRAVENOUS
  Administered 2012-04-08: 165 ug via INTRAVENOUS
  Administered 2012-04-08: 16:00:00 via INTRAVENOUS
  Administered 2012-04-09: 60 ug via INTRAVENOUS
  Administered 2012-04-09: 20:00:00 via INTRAVENOUS
  Administered 2012-04-09: 135 ug via INTRAVENOUS
  Administered 2012-04-09: 30 ug via INTRAVENOUS
  Administered 2012-04-09: 225 ug via INTRAVENOUS
  Administered 2012-04-09: 60 ug via INTRAVENOUS
  Administered 2012-04-10: 315 ug via INTRAVENOUS
  Administered 2012-04-10: 09:00:00 via INTRAVENOUS
  Administered 2012-04-10 (×2): 105 ug via INTRAVENOUS
  Administered 2012-04-10: 135 ug via INTRAVENOUS
  Administered 2012-04-10: 225 ug via INTRAVENOUS
  Administered 2012-04-11: 75 ug via INTRAVENOUS
  Administered 2012-04-11: 105 ug via INTRAVENOUS
  Administered 2012-04-11: 120 ug via INTRAVENOUS
  Administered 2012-04-11: 60 ug via INTRAVENOUS
  Administered 2012-04-11: 19:00:00 via INTRAVENOUS
  Administered 2012-04-11 (×2): 120 ug via INTRAVENOUS
  Administered 2012-04-12: 60 ug via INTRAVENOUS
  Administered 2012-04-12: 75 ug via INTRAVENOUS
  Administered 2012-04-12: 12:00:00 via INTRAVENOUS
  Administered 2012-04-12: 135 ug via INTRAVENOUS
  Administered 2012-04-12: 165 ug via INTRAVENOUS
  Administered 2012-04-13: 40 ug via INTRAVENOUS
  Administered 2012-04-13: 75 ug via INTRAVENOUS
  Administered 2012-04-13: 105 ug via INTRAVENOUS
  Filled 2012-04-08 (×7): qty 50

## 2012-04-08 MED ORDER — PROPOFOL 10 MG/ML IV EMUL
INTRAVENOUS | Status: DC | PRN
  Administered 2012-04-08: 200 mg via INTRAVENOUS

## 2012-04-08 MED ORDER — HYDROMORPHONE 0.3 MG/ML IV SOLN
INTRAVENOUS | Status: AC
  Filled 2012-04-08: qty 25

## 2012-04-08 MED ORDER — BUPIVACAINE ON-Q PAIN PUMP (FOR ORDER SET NO CHG)
INJECTION | Status: DC
  Filled 2012-04-08: qty 1

## 2012-04-08 MED ORDER — DEXTROSE 5 % IV SOLN
1.5000 g | Freq: Two times a day (BID) | INTRAVENOUS | Status: AC
  Administered 2012-04-09 (×2): 1.5 g via INTRAVENOUS
  Filled 2012-04-08 (×2): qty 1.5

## 2012-04-08 MED ORDER — ONDANSETRON HCL 4 MG/2ML IJ SOLN
INTRAMUSCULAR | Status: DC | PRN
  Administered 2012-04-08: 4 mg via INTRAVENOUS

## 2012-04-08 MED ORDER — NALOXONE HCL 0.4 MG/ML IJ SOLN: 0.4000 mg | INTRAMUSCULAR | Status: DC | PRN

## 2012-04-08 MED ORDER — GLYCOPYRROLATE 0.2 MG/ML IJ SOLN
INTRAMUSCULAR | Status: DC | PRN
  Administered 2012-04-08: 0.3 mg via INTRAVENOUS
  Administered 2012-04-08: 0.4 mg via INTRAVENOUS

## 2012-04-08 MED ORDER — ONDANSETRON HCL 4 MG/2ML IJ SOLN: 4.0000 mg | Freq: Four times a day (QID) | INTRAMUSCULAR | Status: DC | PRN

## 2012-04-08 MED ORDER — TRAMADOL HCL 50 MG PO TABS
50.0000 mg | ORAL_TABLET | Freq: Four times a day (QID) | ORAL | Status: DC | PRN
  Administered 2012-04-08: 50 mg via ORAL
  Administered 2012-04-09: 100 mg via ORAL
  Filled 2012-04-08: qty 1
  Filled 2012-04-08: qty 2

## 2012-04-08 MED ORDER — ASPIRIN 325 MG PO TABS
325.0000 mg | ORAL_TABLET | Freq: Every day | ORAL | Status: DC
  Administered 2012-04-08 – 2012-04-13 (×6): 325 mg via ORAL
  Filled 2012-04-08 (×6): qty 1

## 2012-04-08 MED ORDER — ACETAMINOPHEN 10 MG/ML IV SOLN
1000.0000 mg | Freq: Four times a day (QID) | INTRAVENOUS | Status: AC
  Administered 2012-04-08 – 2012-04-09 (×4): 1000 mg via INTRAVENOUS
  Filled 2012-04-08 (×5): qty 100

## 2012-04-08 MED ORDER — ONDANSETRON HCL 4 MG/2ML IJ SOLN
4.0000 mg | Freq: Four times a day (QID) | INTRAMUSCULAR | Status: DC | PRN
  Administered 2012-04-11: 4 mg via INTRAVENOUS
  Filled 2012-04-08: qty 2

## 2012-04-08 MED ORDER — DIPHENHYDRAMINE HCL 50 MG/ML IJ SOLN: 12.5000 mg | Freq: Four times a day (QID) | INTRAMUSCULAR | Status: DC | PRN

## 2012-04-08 MED ORDER — MIDAZOLAM HCL 5 MG/5ML IJ SOLN
INTRAMUSCULAR | Status: DC | PRN
  Administered 2012-04-08: 2 mg via INTRAVENOUS

## 2012-04-08 MED ORDER — HYDROMORPHONE HCL PF 1 MG/ML IJ SOLN: 0.2500 mg | INTRAMUSCULAR | Status: DC | PRN

## 2012-04-08 MED ORDER — HYDROMORPHONE HCL PF 1 MG/ML IJ SOLN
INTRAMUSCULAR | Status: AC
  Administered 2012-04-08: 0.5 mg
  Filled 2012-04-08: qty 1

## 2012-04-08 MED ORDER — LACTATED RINGERS IV SOLN
INTRAVENOUS | Status: DC | PRN
  Administered 2012-04-08: 12:00:00 via INTRAVENOUS

## 2012-04-08 MED ORDER — FENTANYL CITRATE 0.05 MG/ML IJ SOLN
INTRAMUSCULAR | Status: DC | PRN
  Administered 2012-04-08: 25 ug via INTRAVENOUS
  Administered 2012-04-08: 50 ug via INTRAVENOUS

## 2012-04-08 MED ORDER — KCL IN DEXTROSE-NACL 20-5-0.45 MEQ/L-%-% IV SOLN
INTRAVENOUS | Status: DC
  Administered 2012-04-08 – 2012-04-12 (×4): via INTRAVENOUS
  Filled 2012-04-08 (×11): qty 1000

## 2012-04-08 MED ORDER — HEMOSTATIC AGENTS (NO CHARGE) OPTIME
TOPICAL | Status: DC | PRN
  Administered 2012-04-08: 1 via TOPICAL

## 2012-04-08 MED ORDER — POTASSIUM CHLORIDE 10 MEQ/50ML IV SOLN: 10.0000 meq | Freq: Every day | INTRAVENOUS | Status: DC | PRN

## 2012-04-08 MED ORDER — LACTATED RINGERS IV SOLN
INTRAVENOUS | Status: DC | PRN
  Administered 2012-04-08: 11:00:00 via INTRAVENOUS

## 2012-04-08 MED ORDER — ALBUTEROL SULFATE HFA 108 (90 BASE) MCG/ACT IN AERS
4.0000 | INHALATION_SPRAY | Freq: Four times a day (QID) | RESPIRATORY_TRACT | Status: AC
  Administered 2012-04-08 – 2012-04-10 (×6): 4 via RESPIRATORY_TRACT
  Filled 2012-04-08: qty 6.7

## 2012-04-08 MED ORDER — BISACODYL 5 MG PO TBEC
10.0000 mg | DELAYED_RELEASE_TABLET | Freq: Every day | ORAL | Status: DC
  Administered 2012-04-08 – 2012-04-10 (×3): 10 mg via ORAL
  Filled 2012-04-08 (×3): qty 2

## 2012-04-08 MED ORDER — CLONAZEPAM 0.5 MG PO TABS: 1.0000 mg | ORAL_TABLET | Freq: Two times a day (BID) | ORAL | Status: DC | PRN

## 2012-04-08 MED ORDER — DIPHENHYDRAMINE HCL 12.5 MG/5ML PO ELIX
12.5000 mg | ORAL_SOLUTION | Freq: Four times a day (QID) | ORAL | Status: DC | PRN
  Filled 2012-04-08: qty 5

## 2012-04-08 MED ORDER — PHENYLEPHRINE HCL 10 MG/ML IJ SOLN
10.0000 mg | INTRAVENOUS | Status: DC | PRN
  Administered 2012-04-08: 100 ug/min via INTRAVENOUS

## 2012-04-08 MED ORDER — HYDROMORPHONE HCL PF 1 MG/ML IJ SOLN
INTRAMUSCULAR | Status: AC
  Filled 2012-04-08: qty 1

## 2012-04-08 MED ORDER — MIRTAZAPINE 45 MG PO TABS
45.0000 mg | ORAL_TABLET | Freq: Every day | ORAL | Status: DC
  Administered 2012-04-08 – 2012-04-12 (×4): 45 mg via ORAL
  Filled 2012-04-08 (×6): qty 1

## 2012-04-08 MED ORDER — OXYCODONE-ACETAMINOPHEN 5-325 MG PO TABS
1.0000 | ORAL_TABLET | ORAL | Status: DC | PRN
  Administered 2012-04-10 – 2012-04-12 (×13): 2 via ORAL
  Administered 2012-04-13: 1 via ORAL
  Administered 2012-04-13: 2 via ORAL
  Filled 2012-04-08 (×16): qty 2

## 2012-04-08 MED ORDER — KCL IN DEXTROSE-NACL 20-5-0.45 MEQ/L-%-% IV SOLN
INTRAVENOUS | Status: AC
  Filled 2012-04-08: qty 1000

## 2012-04-08 MED ORDER — OXYCODONE HCL 5 MG PO TABS
5.0000 mg | ORAL_TABLET | ORAL | Status: AC | PRN
  Administered 2012-04-09 (×2): 10 mg via ORAL
  Filled 2012-04-08 (×2): qty 2

## 2012-04-08 MED ORDER — BUPIVACAINE 0.5 % ON-Q PUMP SINGLE CATH 400 ML
400.0000 mL | INJECTION | Status: DC
  Filled 2012-04-08: qty 400

## 2012-04-08 MED ORDER — DROPERIDOL 2.5 MG/ML IJ SOLN: 0.6250 mg | INTRAMUSCULAR | Status: DC | PRN

## 2012-04-08 SURGICAL SUPPLY — 96 items
ADH SKN CLS APL DERMABOND .7 (GAUZE/BANDAGES/DRESSINGS) ×2
BALL CTTN LRG ABS STRL LF (GAUZE/BANDAGES/DRESSINGS)
BLADE SURG 11 STRL SS (BLADE) IMPLANT
BRUSH CYTOL CELLEBRITY 1.5X140 (MISCELLANEOUS) IMPLANT
CANISTER SUCTION 2500CC (MISCELLANEOUS) ×8 IMPLANT
CATH KIT ON Q 5IN SLV (PAIN MANAGEMENT) ×1 IMPLANT
CATH THORACIC 28FR (CATHETERS) ×1 IMPLANT
CATH THORACIC 36FR (CATHETERS) IMPLANT
CATH THORACIC 36FR RT ANG (CATHETERS) IMPLANT
CLIP TI MEDIUM 24 (CLIP) ×3 IMPLANT
CLOTH BEACON ORANGE TIMEOUT ST (SAFETY) ×6 IMPLANT
CONN ST 1/4X3/8  BEN (MISCELLANEOUS) ×1
CONN ST 1/4X3/8 BEN (MISCELLANEOUS) IMPLANT
CONT SPEC 4OZ CLIKSEAL STRL BL (MISCELLANEOUS) ×12 IMPLANT
COTTONBALL LRG STERILE PKG (GAUZE/BANDAGES/DRESSINGS) IMPLANT
COVER SURGICAL LIGHT HANDLE (MISCELLANEOUS) ×12 IMPLANT
COVER TABLE BACK 60X90 (DRAPES) ×3 IMPLANT
DERMABOND ADVANCED (GAUZE/BANDAGES/DRESSINGS) ×1
DERMABOND ADVANCED .7 DNX12 (GAUZE/BANDAGES/DRESSINGS) IMPLANT
DRAIN CHANNEL 32F RND 10.7 FF (WOUND CARE) ×1 IMPLANT
DRAPE CAMERA CLOSED 9X96 (DRAPES) IMPLANT
DRAPE LAPAROSCOPIC ABDOMINAL (DRAPES) ×3 IMPLANT
DRSG TEGADERM 4X4.75 (GAUZE/BANDAGES/DRESSINGS) ×1 IMPLANT
ELECT REM PT RETURN 9FT ADLT (ELECTROSURGICAL) ×3
ELECTRODE REM PT RTRN 9FT ADLT (ELECTROSURGICAL) ×2 IMPLANT
FORCEPS BIOP RJ4 1.8 (CUTTING FORCEPS) IMPLANT
GLOVE BIO SURGEON STRL SZ 6 (GLOVE) ×1 IMPLANT
GLOVE BIOGEL PI IND STRL 6.5 (GLOVE) IMPLANT
GLOVE BIOGEL PI IND STRL 7.0 (GLOVE) IMPLANT
GLOVE BIOGEL PI INDICATOR 6.5 (GLOVE) ×1
GLOVE BIOGEL PI INDICATOR 7.0 (GLOVE) ×2
GLOVE EUDERMIC 7 POWDERFREE (GLOVE) ×9 IMPLANT
GLOVE SS BIOGEL STRL SZ 6 (GLOVE) IMPLANT
GLOVE SUPERSENSE BIOGEL SZ 6 (GLOVE) ×1
GOWN PREVENTION PLUS XLARGE (GOWN DISPOSABLE) ×3 IMPLANT
GOWN STRL NON-REIN LRG LVL3 (GOWN DISPOSABLE) ×7 IMPLANT
HANDLE STAPLE ENDO GIA SHORT (STAPLE) ×1
KIT BASIN OR (CUSTOM PROCEDURE TRAY) ×3 IMPLANT
KIT ROOM TURNOVER OR (KITS) ×6 IMPLANT
MARKER SKIN DUAL TIP RULER LAB (MISCELLANEOUS) ×3 IMPLANT
NDL BIOPSY TRANSBRONCH 21G (NEEDLE) IMPLANT
NEEDLE 22X1 1/2 (OR ONLY) (NEEDLE) IMPLANT
NEEDLE BIOPSY TRANSBRONCH 21G (NEEDLE) IMPLANT
NS IRRIG 1000ML POUR BTL (IV SOLUTION) ×9 IMPLANT
OIL SILICONE PENTAX (PARTS (SERVICE/REPAIRS)) ×3 IMPLANT
PACK CHEST (CUSTOM PROCEDURE TRAY) ×3 IMPLANT
PAD ARMBOARD 7.5X6 YLW CONV (MISCELLANEOUS) ×12 IMPLANT
RELOAD EGIA 45 MED/THCK PURPLE (STAPLE) ×4 IMPLANT
RELOAD EGIA 60 MED/THCK PURPLE (STAPLE) ×9 IMPLANT
RELOAD EGIA TRIS TAN 45 CVD (STAPLE) ×3 IMPLANT
RELOAD STAPLE 45 TAN MED CVD (STAPLE) IMPLANT
RELOAD STAPLE 60 MED/THCK ART (STAPLE) IMPLANT
SEALANT PROGEL (MISCELLANEOUS) ×1 IMPLANT
SEALANT SURG COSEAL 4ML (VASCULAR PRODUCTS) IMPLANT
SEALANT SURG COSEAL 8ML (VASCULAR PRODUCTS) IMPLANT
SOLUTION ANTI FOG 6CC (MISCELLANEOUS) ×2 IMPLANT
SPECIMEN JAR MEDIUM (MISCELLANEOUS) ×2 IMPLANT
SPONGE GAUZE 4X4 12PLY (GAUZE/BANDAGES/DRESSINGS) ×6 IMPLANT
STAPLER ENDO GIA 12 SHRT THIN (STAPLE) IMPLANT
STAPLER ENDO GIA 12MM SHORT (STAPLE) ×2 IMPLANT
STAPLER ENDO TA30 3.5 (STAPLE) ×1 IMPLANT
STAPLER TA30 4.8 NON-ABS (STAPLE) ×1 IMPLANT
SUT PROLENE 3 0 SH DA (SUTURE) IMPLANT
SUT PROLENE 4 0 RB 1 (SUTURE)
SUT PROLENE 4-0 RB1 .5 CRCL 36 (SUTURE) IMPLANT
SUT SILK  1 MH (SUTURE) ×2
SUT SILK 1 MH (SUTURE) ×4 IMPLANT
SUT SILK 2 0 SH (SUTURE) ×2 IMPLANT
SUT SILK 2 0SH CR/8 30 (SUTURE) IMPLANT
SUT SILK 3 0 SH CR/8 (SUTURE) ×1 IMPLANT
SUT VIC AB 1 CTX 36 (SUTURE) ×6
SUT VIC AB 1 CTX36XBRD ANBCTR (SUTURE) ×4 IMPLANT
SUT VIC AB 2 TP1 27 (SUTURE) ×4 IMPLANT
SUT VIC AB 2-0 CT1 27 (SUTURE)
SUT VIC AB 2-0 CT1 TAPERPNT 27 (SUTURE) IMPLANT
SUT VIC AB 2-0 CTX 36 (SUTURE) ×6 IMPLANT
SUT VIC AB 3-0 SH 27 (SUTURE)
SUT VIC AB 3-0 SH 27X BRD (SUTURE) IMPLANT
SUT VIC AB 3-0 X1 27 (SUTURE) ×6 IMPLANT
SYR 20ML ECCENTRIC (SYRINGE) ×3 IMPLANT
SYR 5ML LUER SLIP (SYRINGE) ×3 IMPLANT
SYR CONTROL 10ML LL (SYRINGE) IMPLANT
SYSTEM SAHARA CHEST DRAIN ATS (WOUND CARE) ×2 IMPLANT
TAPE CLOTH SURG 4X10 WHT LF (GAUZE/BANDAGES/DRESSINGS) ×2 IMPLANT
TAPE UMBILICAL 1/8 X36 TWILL (MISCELLANEOUS) ×1 IMPLANT
TIP APPLICATOR SPRAY EXTEND 16 (VASCULAR PRODUCTS) IMPLANT
TOWEL OR 17X24 6PK STRL BLUE (TOWEL DISPOSABLE) ×7 IMPLANT
TOWEL OR 17X26 10 PK STRL BLUE (TOWEL DISPOSABLE) ×3 IMPLANT
TRAP SPECIMEN MUCOUS 40CC (MISCELLANEOUS) ×3 IMPLANT
TRAY FOLEY CATH 14FRSI W/METER (CATHETERS) ×3 IMPLANT
TUBE CONNECTING 12X1/4 (SUCTIONS) ×3 IMPLANT
TUNNELER SHEATH ON-Q 11GX8 (MISCELLANEOUS) ×1 IMPLANT
VALVE BIOPSY  SINGLE USE (MISCELLANEOUS)
VALVE BIOPSY SINGLE USE (MISCELLANEOUS) IMPLANT
VALVE SUCTION BRONCHIO DISP (MISCELLANEOUS) IMPLANT
WATER STERILE IRR 1000ML POUR (IV SOLUTION) ×9 IMPLANT

## 2012-04-08 NOTE — Progress Notes (Signed)
Patient ID: Patrick Lee, male   DOB: Feb 29, 1961, 51 y.o.   MRN: [id number]   Filed Vitals:   04/08/12 1815 04/08/12 1830 04/08/12 1845 04/08/12 1900  BP: 140/75   115/70  Pulse: 110 110 108 103  Temp:      TempSrc:      Resp: 18 14 12 10   SpO2: 98% 97% 99% 97%   Awake and alert, sore Urine output good. CT output low Postop CXR ok Doing well postop

## 2012-04-08 NOTE — Anesthesia Postprocedure Evaluation (Signed)
Anesthesia Post Note  Patient: Patrick Lee  Procedure(s) Performed: Procedure(s) (LRB): THORACOTOMY/LOBECTOMY (Right) VIDEO BRONCHOSCOPY (N/A)  Anesthesia type: general  Patient location: PACU  Post pain: Pain level controlled  Post assessment: Patient's Cardiovascular Status Stable  Last Vitals:  Filed Vitals:   04/08/12 1601  BP: 125/72  Pulse:   Temp:   Resp: 16    Post vital signs: Reviewed and stable  Level of consciousness: sedated  Complications: No apparent anesthesia complications

## 2012-04-08 NOTE — Brief Op Note (Addendum)
04/08/2012  3:13 PM  PATIENT:  Patrick Lee  51 y.o. male  PRE-OPERATIVE DIAGNOSIS: right lower lobe lung mass  POST-OPERATIVE DIAGNOSIS:  Non-small cell carcinoma  PROCEDURE:  Procedure(s): Flexible bronchoscopy Right muscle-sparing thoracotomy Wedge resection of lesion in superior segment of right lower lobe Right lower lobectomy and mediastinal lymph node dissection  SURGEON:  Surgeon(s): Alleen Borne, MD  ASSISTANT: Coral Ceo, PA-C  ANESTHESIA:   general  SPECIMEN:  1.  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  2. Right lower lobe lung  DISPOSITION OF SPECIMEN:  Pathology  DRAINS: 28 Fr CT, 32 Fr Blake drain  PATIENT CONDITION:  PACU - hemodynamically stable.

## 2012-04-08 NOTE — Preoperative (Signed)
Beta Blockers   Reason not to administer Beta Blockers:Not Applicable 

## 2012-04-08 NOTE — Transfer of Care (Signed)
Immediate Anesthesia Transfer of Care Note  Patient: Patrick Lee  Procedure(s) Performed: Procedure(s) (LRB): THORACOTOMY/LOBECTOMY (Right) VIDEO BRONCHOSCOPY (N/A)  Patient Location: PACU  Anesthesia Type: General  Level of Consciousness: awake, alert  and oriented  Airway & Oxygen Therapy: Patient Spontanous Breathing and Patient connected to face mask oxygen  Post-op Assessment: Report given to PACU RN and Patient moving all extremities X 4  Post vital signs: Reviewed and stable  Complications: No apparent anesthesia complications

## 2012-04-08 NOTE — Op Note (Signed)
NAMEDERRAL, COLUCCI NO.:  [id number]  MEDICAL RECORD NO.:  [id number]  LOCATION:  2315                         FACILITY:  [facility]  PHYSICIAN:  Evelene Croon, M.D.     DATE OF BIRTH:  February 23, 1961  DATE OF PROCEDURE:  04/08/2012 DATE OF DISCHARGE:                              OPERATIVE REPORT   PREOPERATIVE DIAGNOSIS:  Cavitary lung lesion in the superior segment of the right lower lobe.  POSTOPERATIVE DIAGNOSIS:  Cavitary lung lesion in the superior segment of the right lower lobe.  PROCEDURES: 1. Flexible fiberoptic bronchoscopy. 2. Right muscle-sparing thoracotomy with wedge resection of superior     segment lung lesion, right lower lobectomy.  ATTENDING SURGEON:  Evelene Croon, MD  ASSISTANT:  Coral Ceo, PA-C  ANESTHESIA:  General endotracheal.  CLINICAL HISTORY:  This patient is a 51 year old who smoked until just recently, who also has a history of pancreatic cancer, status post Whipple procedure at [facility] about 7 years ago.  He reportedly had positive lymph nodes and was treated with chemotherapy postoperatively and is continued to do well without recurrent cancer. He had a CT scan of the chest done last summer at [facility], it showed a cavitary lesion in the right lower lobe measuring 1.2 x 0.8 cm.  He said that he underwent a needle biopsy which was negative.  He had a followup CT scan in October 2012, which continued to show this lesion. It has not been seen on previous scans in 2008.  In December 2012, he underwent bronchoscopy with bronchial washings, which showed acute inflammatory cells, but no tumor identified.  He underwent a PET scan on January 23, 2012, which showed mild FDG uptake within the cavitary superior segment lesion in the right lower lobe with a maximum SUV of 2.2, compared to a background of 1.4.  There were no other areas of abnormal FDG uptake.  A followup CT scan done on Mar 23, 2012 continued to  showed this spiculated cavitary lesion in the right lower lobe, which had not increased in size compared to the CT scan of December 25, 2011, had increased in size compared to the CT scan back in August 2012.  The lesion now was measuring 1.5 x 1.2 cm.  After review of the scans and examination of the patient, I felt the best option would be to perform a wedge resection of the lesion in the superior segment and if it was a cancer to proceed with right lower lobectomy.  We obtained preoperative pulmonary functions, which showed an FEV 1 of 3.13 with a diffusion capacity of about 60% of predicted.  I felt that he would tolerate right lower lobectomy without difficulty.  I discussed the options of proceeding ahead with surgical resection versus performing further biopsy procedures and he was in agreement to proceed with surgical removal.  I have discussed the operative plan with he and his family. We discussed alternatives, benefits, and risks including, but not limited to bleeding, infection, bronchial stump complications, prolonged air leak, pleural space problems, respiratory failure, and the possibly that this may be a benign lesion.  He understood all of these and agreed to  proceed.  OPERATIVE PROCEDURE:  The patient was seen in the preoperative holding area and after reviewing his CT scan, the consent was signed and the right side of the chest was signed by me.  He was given preoperative intravenous antibiotics.  He was taken back to the operating room and placed on the table in supine position.  After induction of general endotracheal anesthesia using a single-lumen tube, a Foley catheter was placed in bladder using sterile technique.  Lower extremity sequential compression devices were used.  Then, flexible fiberoptic bronchoscopy was performed.  The distal trachea was normal.  The carina was sharp. The left and right bronchial trees had normal segmental anatomy and the bronchi were  free of lesions out to the subsegmental level.  There was no sign of extrinsic compression and no secretions present.  The bronchoscope was then withdrawn from the patient.  Then, the single- lumen tube was converted to a double-lumen tube by Anesthesiology.  The patient was positioned in the left lateral decubitus position with right side up.  The right side of the chest was prepped with Betadine soap and solution, draped in a usual sterile manner.  Then, a time-out was taken. Proper patient, proper operation, proper operative side were confirmed by the nursing and anesthesia staff.  Then, the right chest was entered through a short lateral muscle-sparing thoracotomy incision.  The pleural space was entered through the fifth intercostal space. Examination of the pleural space showed no abnormality on the parietal pleura.  Examination of the lung showed no abnormality in the upper or middle lobe.  There was a partial fissure between the upper and middle lobe, but the majority of the minor fissure was joined.  Likewise, the fissure between the middle and lower lobe was only partly formed. Examination of the right lower lobe showed the lesion in the superior segment.  There was slight umbilication on the surface of long and this lesion was palpable.  Then, a wedge resection was performed using surgical staplers and the lesion was sent to pathology for frozen section.  The pathology was called and said this was non-small cell carcinoma favoring adenocarcinoma.  Therefore, I decided to proceed with right lower lobectomy, which I felt would give him the best chance of a surgical cure and the least chance of recurrence.  Then, the major fissure was opened and the interlobar portion of the pulmonary artery was identified.  There were two branches to the superior segment.  The middle lobe branches were identified and preserved.  The interlobar pulmonary artery was encircled just below the takeoff  of the middle lobe branches and was then divided at this location using a vascular stapler. The inferior pulmonary ligament was divided.  Dissection was continued up to the inferior pulmonary vein, which was encircled with a tape and then divided using a vascular stapler.  The upper lobe pulmonary vein and middle lobe branch were identified and preserved.  Then, the fissure between the upper and lower, and middle and lower lobe were completed using staplers.  Then, the bronchus intermedius was identified.  Just below the takeoff of the middle lobe bronchus, the bronchus intermedius was encircled with a tape and then with a TA-30 linear stapler with 4.8- mm staples.  The stapler was closed to occlude the bronchus.  Then, the right lung was reinflated and there was easy inflation in the middle and upper lobe.  The stapler was fired and the bronchus was divided beyond the stapler.  The right  lower lobe was then passed off the table along with its lymph nodes.  The bronchial stump was then tested under saline solution at 30 cm of pressure and there were no air leaks.  The staple lines on the lung were coated with ProGel to seal any air leaks.  Then, mediastinal lymph node dissection was performed.  I did not see any lymph nodes in the right peritracheal or subcarinal locations.  The only lymph nodes that I did see were around the bronchus intermedius and were taken with the specimen.  There was complete hemostasis.  Then, an On-Q catheter was brought through a separate stab incision and positioned in a subpleural location posterior to the thoracotomy incision.  This was flushed with 5 mL of 0.5% percent Marcaine and connected to a paint ball filled with 0.5% Marcaine.  Then, two chest tubes were placed with a 28-French straight tube positioned anteriorly and a 32-French Blake drain positioned posteriorly.  The ribs were then reapproximated with #2 Vicryl pericostal sutures.  Before tying  the sutures, the right lung was reinflated and completely inflated.  The pericostal sutures were tied.  The muscles were returned to the normal anatomic position.  Subcutaneous tissue was closed with continuous 2-0 Vicryl and the skin with a 3-0 Vicryl subcuticular skin closure.  The sponge, needle, and instrument counts were correct according to the scrub nurse. Dry sterile dressing was applied over the incision and around the chest tubes, which were hooked to Pleur-Evac suction.  The patient was then turned in the supine position, extubated, and transferred to the postanesthesia care unit in satisfactory and stable condition.     Evelene Croon, M.D.     BB/MEDQ  D:  04/08/2012  T:  04/08/2012  Job:  [id number]

## 2012-04-08 NOTE — Anesthesia Preprocedure Evaluation (Addendum)
Anesthesia Evaluation    Airway       Dental   Pulmonary          Cardiovascular + Past MI  EF 60% no ischemia 6/12 myoview   Neuro/Psych Anxiety    GI/Hepatic Neg liver ROS, HX of Whipple in 2007   Endo/Other  negative endocrine ROS  Renal/GU negative Renal ROS     Musculoskeletal   Abdominal   Peds  Hematology   Anesthesia Other Findings   Reproductive/Obstetrics                          Anesthesia Physical Anesthesia Plan  ASA: III  Anesthesia Plan: General   Post-op Pain Management:    Induction: Intravenous  Airway Management Planned: Double Lumen EBT  Additional Equipment: CVP and Arterial line  Intra-op Plan:   Post-operative Plan: Post-operative intubation/ventilation  Informed Consent: I have reviewed the patients History and Physical, chart, labs and discussed the procedure including the risks, benefits and alternatives for the proposed anesthesia with the patient or authorized representative who has indicated his/her understanding and acceptance.   Dental advisory given  Plan Discussed with: CRNA, Anesthesiologist and Surgeon  Anesthesia Plan Comments:         Anesthesia Quick Evaluation

## 2012-04-08 NOTE — OR Nursing (Signed)
Procedure 2 Right thoracotomy started at 1307.

## 2012-04-09 ENCOUNTER — Inpatient Hospital Stay (HOSPITAL_COMMUNITY): Payer: Medicare Other

## 2012-04-09 LAB — CBC
HCT: 39.7 % (ref 39.0–52.0)
Hemoglobin: 13.8 g/dL (ref 13.0–17.0)
MCHC: 34.8 g/dL (ref 30.0–36.0)
MCV: 98.3 fL (ref 78.0–100.0)
RDW: 12.6 % (ref 11.5–15.5)

## 2012-04-09 LAB — BASIC METABOLIC PANEL
BUN: 9 mg/dL (ref 6–23)
Chloride: 107 mEq/L (ref 96–112)
Creatinine, Ser: 0.71 mg/dL (ref 0.50–1.35)
GFR calc Af Amer: >90 mL/min (ref >90)
GFR calc non Af Amer: >90 mL/min (ref >90)
Glucose, Bld: 131 mg/dL — ABNORMAL HIGH (ref 70–99)

## 2012-04-09 LAB — POCT I-STAT 3, ART BLOOD GAS (G3+)
Bicarbonate: 25.2 mEq/L — ABNORMAL HIGH (ref 20.0–24.0)
O2 Saturation: 98 %
TCO2: 27 mmol/L (ref 0–100)
pCO2 arterial: 44.1 mmHg (ref 35.0–45.0)
pH, Arterial: 7.36 (ref 7.350–7.450)

## 2012-04-09 MED ORDER — METOPROLOL TARTRATE 25 MG PO TABS
25.0000 mg | ORAL_TABLET | Freq: Two times a day (BID) | ORAL | Status: DC
  Filled 2012-04-09: qty 1

## 2012-04-09 MED ORDER — METOPROLOL TARTRATE 25 MG PO TABS
25.0000 mg | ORAL_TABLET | Freq: Two times a day (BID) | ORAL | Status: DC
  Administered 2012-04-09 – 2012-04-12 (×6): 25 mg via ORAL
  Filled 2012-04-09 (×9): qty 1

## 2012-04-09 NOTE — Plan of Care (Signed)
Problem: Consults Goal: Thoracotomy Patient Education (See Patient Education module for education specifics.) Outcome: Progressing Patient received education on diet and activity as it relates to POD #1 Thoracotomy follow-up/care. Goal: Skin Care Protocol Initiated - if indicated If consults are not indicated, leave blank or document N/A Outcome: Progressing Peri care, turning and ambulation schedule explained to patient

## 2012-04-09 NOTE — Progress Notes (Signed)
[address]            [name] [address]          [phone]     1 Day Post-Op Procedure(s) (LRB): THORACOTOMY/LOBECTOMY (Right) VIDEO BRONCHOSCOPY (N/A)  Subjective: OOB in chair.  Sore, but otherwise stable.  Objective: Vital signs in last 24 hours: Patient Vitals for the past 24 hrs:  BP Temp Temp src Pulse Resp SpO2 Weight  04/09/12 0734 - 97.8 F (36.6 C) Oral - - - -  04/09/12 0700 128/77 mmHg - - 66  10  100 % -  04/09/12 0600 115/76 mmHg - - 67  10  100 % 153 lb 10.6 oz (69.7 kg)  04/09/12 0500 130/74 mmHg - - 62  17  100 % -  04/09/12 0400 115/77 mmHg 97.9 F (36.6 C) Oral 61  20  100 % -  04/09/12 0300 109/62 mmHg - - 84  14  100 % -  04/09/12 0215 - - - - - 100 % -  04/09/12 0200 97/60 mmHg - - 67  14  99 % -  04/09/12 0100 95/62 mmHg - - 76  17  99 % -  04/09/12 0000 97/62 mmHg 98.5 F (36.9 C) Oral 81  28  98 % -  04/08/12 2300 97/65 mmHg - - 95  12  100 % -  04/08/12 2200 115/72 mmHg - - 104  12  98 % -  04/08/12 2100 111/70 mmHg - - 103  11  98 % -  04/08/12 2000 112/66 mmHg 98.1 F (36.7 C) Oral 115  13  97 % -  04/08/12 1943 - - - - - 97 % -  04/08/12 1900 115/70 mmHg - - 103  10  97 % -  04/08/12 1845 - - - 108  12  99 % -  04/08/12 1830 - - - 110  14  97 % -  04/08/12 1815 140/75 mmHg - - 110  18  98 % -  04/08/12 1800 121/71 mmHg - - 106  14  99 % -  04/08/12 1745 - - - 107  14  100 % -  04/08/12 1730 - - - 103  15  100 % -  04/08/12 1715 - - - 101  14  100 % -  04/08/12 1645 130/76 mmHg 96.8 F (36 C) - 96  12  100 % -  04/08/12 1630 121/76 mmHg - - - - - -  04/08/12 1615 115/90 mmHg - - - - - -  04/08/12 1601 125/72 mmHg - - - 16  100 % -  04/08/12 1600 127/72 mmHg - - - - - -  04/08/12 1545 127/76 mmHg 96.8 F (36 C) - - - - -  04/08/12 0751 116/77 mmHg 98 F (36.7 C) Oral 54  18  100 % -   Current Weight  04/09/12 153 lb 10.6 oz (69.7 kg)     Intake/Output from previous day: 06/05 0701 -  06/06 0700 In: 4442.5 [P.O.:1440; I.V.:2652.5; IV Piggyback:350] Out: 3610 [Urine:3110; Blood:150; Chest Tube:350]    PHYSICAL EXAM:  Heart: RRR Lungs: slightly decreased BS in R base Wound: dressed and dry Chest tube: no air leak   Lab Results: CBC: Basename 04/09/12 0420 04/08/12 1401  WBC 11.5* --  HGB 13.8 14.3  HCT 39.7 42.0  PLT 176 --  BMET:  Basename 04/09/12 0420 04/08/12 1401  NA 140 139  K 4.0 4.0  CL 107 --  CO2 25 --  GLUCOSE 131* --  BUN 9 --  CREATININE 0.71 --  CALCIUM 8.2* --    CXR: IMPRESSION:  1. Stable right-sided chest tubes without pneumothorax.  2. Minimal right basilar atelectasis.   Assessment/Plan: S/P Procedure(s) (LRB): THORACOTOMY/LOBECTOMY (Right) VIDEO BRONCHOSCOPY (N/A) Stable, post-op day 1.  Will d/c art line, Foley, mobilize.  Continue pulm toilet. Continue chest tubes. May be able to decrease to water seal. ?Tx 3300.   LOS: 1 day    Jahking Lesser H 04/09/2012

## 2012-04-09 NOTE — Progress Notes (Signed)
Chaplain responded to a referral from Spiritual Care Department Director Theda Belfast. Chaplain visited with patient and his fiancee, 40. Patient stated that he is coping okay. Chaplain provided presence, listened, and prayed with patient and family. Patient requested a follow up visit. Chaplain plans to follow up with patient.

## 2012-04-09 NOTE — Progress Notes (Signed)
Utilization review completed.  

## 2012-04-09 NOTE — Progress Notes (Addendum)
Dr. Laneta Simmers called to notify of patients sustained tachycardia in the 110's-120's for the past 45 minutes. Pt's BP 150's, temp 98.0. New orders received from [facility], MD. MD notified that ON-Q pump was accidentally removed by patient.

## 2012-04-10 ENCOUNTER — Encounter (HOSPITAL_COMMUNITY): Payer: Self-pay | Admitting: Surgery

## 2012-04-10 ENCOUNTER — Inpatient Hospital Stay (HOSPITAL_COMMUNITY): Payer: Medicare Other

## 2012-04-10 LAB — COMPREHENSIVE METABOLIC PANEL
AST: 30 U/L (ref 0–37)
BUN: 7 mg/dL (ref 6–23)
CO2: 29 mEq/L (ref 19–32)
Calcium: 8.7 mg/dL (ref 8.4–10.5)
Chloride: 101 mEq/L (ref 96–112)
Creatinine, Ser: 0.92 mg/dL (ref 0.50–1.35)
GFR calc Af Amer: >90 mL/min (ref >90)
GFR calc non Af Amer: >90 mL/min (ref >90)
Glucose, Bld: 142 mg/dL — ABNORMAL HIGH (ref 70–99)
Total Bilirubin: 0.4 mg/dL (ref 0.3–1.2)

## 2012-04-10 LAB — CBC
HCT: 41 % (ref 39.0–52.0)
Hemoglobin: 13.7 g/dL (ref 13.0–17.0)
MCH: 33.8 pg (ref 26.0–34.0)
MCV: 101.2 fL — ABNORMAL HIGH (ref 78.0–100.0)
RBC: 4.05 MIL/uL — ABNORMAL LOW (ref 4.22–5.81)
WBC: 11 10*3/uL — ABNORMAL HIGH (ref 4.0–10.5)

## 2012-04-10 MED ORDER — OXYCODONE-ACETAMINOPHEN 5-325 MG PO TABS: 1.0000 | ORAL_TABLET | ORAL | Status: DC | PRN

## 2012-04-10 MED ORDER — METOPROLOL TARTRATE 25 MG PO TABS: 25.0000 mg | ORAL_TABLET | Freq: Two times a day (BID) | ORAL | Status: DC

## 2012-04-10 NOTE — Discharge Instructions (Signed)
ACTIVITY:  1.Increase activity slowly. 2.Walk daily and increase frequency and duration as tolerates. 3.May walk up steps. 4.No lifting more than ten pounds for two weeks. 5.No driving for two weeks. 6.Avoid straining. 7.STOP any activity that causes chest pain, shortness of breath, dizziness,sweating, or  excessive weakness. 8.Continue with breathing exercises daily.  DIET:low salt  WOUND:  1.May shower. 2.Clean wounds with mild soap and water.  Call the office at [phone] if any  problems arise.  Thoracotomy Care After Refer to this sheet in the next few weeks. These instructions provide you with information on caring for yourself after your procedure. Your caregiver may also give you more specific instructions. Your treatment has been planned according to current medical practices, but problems sometimes occur. Call your caregiver if you have any problems or questions after your procedure. HOME CARE INSTRUCTIONS  Remove the bandage (dressing) over your chest tube site as directed by your caregiver.   It is normal to be sore for a couple weeks following surgery. See your caregiver if this seems to be getting worse rather than better.   Only take over-the-counter or prescription medicines for pain, discomfort, or fever as directed by your caregiver. It is very important to take pain medicine when you need it so that you will cough and breathe deeply enough to clear mucus (phlegm) and expand your lungs. This helps prevent a lung infection (pneumonia).   If it hurts to cough, hold a pillow against your chest when you cough. This may help with the discomfort. In spite of the discomfort, cough frequently.   Taking deep breaths keeps lungs inflated and protects against pneumonia. Most patients will go home with a device called an incentive spirometer that encourages deep breathing.   You may resume a normal diet and activities as directed.   Use showers for bathing until you see  your caregiver, or as instructed.   Change dressings if necessary or as directed.   Avoid lifting or driving until you are instructed otherwise.   Make an appointment to see your caregiver for stitch (suture) or staple removal when instructed.   Do not travel by airplane for 2 weeks after the chest tube is removed.  SEEK MEDICAL CARE IF:  You are bleeding from your wounds.   Your heartbeat seems irregular.   You have redness, swelling, or increasing pain in the wounds.   There is pus coming from your wounds.   There is a bad smell coming from the wound or dressing.  SEEK IMMEDIATE MEDICAL CARE IF:  You have a fever.   You develop a rash.   You have difficulty breathing.   You develop any reaction or side effects to medicines given.   You develop lightheadedness or feel faint.   You develop shortness of breath or chest pain.  MAKE SURE YOU:  Understand these instructions.   Will watch your condition.   Will get help right away if you are not doing well or get worse.  Document Released: 04/05/2011 Document Revised: 10/10/2011 Document Reviewed: 04/05/2011 Exit[employer] Patient Information 2012 [employer]. 

## 2012-04-10 NOTE — Progress Notes (Addendum)
[address]            [place] [address]          [phone]     2 Days Post-Op Procedure(s) (LRB): THORACOTOMY/LOBECTOMY (Right) VIDEO BRONCHOSCOPY (N/A)  Subjective: Tachy overnight, Lopressor started.  C/O soreness this am, otherwise feels well.  Walked in halls yesterday.   Objective: Vital signs in last 24 hours: Patient Vitals for the past 24 hrs:  BP Temp Temp src Pulse Resp SpO2 Height Weight  04/10/12 0824 - 99.7 F (37.6 C) Oral - - 95 % - -  04/10/12 0407 - 100.3 F (37.9 C) Oral - - - - -  04/09/12 2353 - 100.1 F (37.8 C) Oral - - - - -  04/09/12 2032 - - - - - 98 % - -  04/09/12 2000 140/71 mmHg 98.2 F (36.8 C) Oral 106  14  96 % - -  04/09/12 1830 - - - 110  15  97 % - -  04/09/12 1808 - - - 156  19  94 % - -  04/09/12 1807 - - - 150  17  95 % - -  04/09/12 1806 - - - 142  19  95 % - -  04/09/12 1800 - - - 135  21  97 % - -  04/09/12 1600 - - - 120  14  97 % - -  04/09/12 1545 157/83 mmHg 98 F (36.7 C) Oral 121  16  96 % - -  04/09/12 1530 - - - 119  17  97 % - -  04/09/12 1515 - - - 117  17  97 % - -  04/09/12 1500 - - - 133  18  98 % - -  04/09/12 1445 - - - 122  16  99 % - -  04/09/12 1428 - - - - - 99 % - -  04/09/12 1400 - - - 95  16  98 % - -  04/09/12 1330 - - - 96  14  99 % - -  04/09/12 1300 - - - 102  18  99 % - -  04/09/12 1216 140/84 mmHg - - 116  16  99 % 6' (1.829 m) 157 lb 3 oz (71.3 kg)  04/09/12 1200 - - - - 17  99 % - -  04/09/12 1100 146/92 mmHg - - 101  13  99 % - -  04/09/12 1000 136/87 mmHg - - 106  15  98 % - -  04/09/12 0900 129/67 mmHg - - 74  16  99 % - -  04/09/12 0857 - - - - - 99 % - -   Current Weight  04/09/12 157 lb 3 oz (71.3 kg)     Intake/Output from previous day: 06/06 0701 - 06/07 0700 In: 2164.2 [P.O.:1200; I.V.:864.2; IV Piggyback:100] Out: 2005 [Urine:1925; Chest Tube:80]  CBGs 131-142  PHYSICAL EXAM:  Heart: RRR, sl tachy Lungs: BS slightly decreased in R  base Wound: clean and dry Chest tube: no air leak   Lab Results: CBC: Basename 04/10/12 0350 04/09/12 0420  WBC 11.0* 11.5*  HGB 13.7 13.8  HCT 41.0 39.7  PLT 184 176   BMET:  Basename 04/10/12 0350 04/09/12 0420  NA 138 140  K 4.2 4.0  CL 101 107  CO2 29 25  GLUCOSE 142*  131*  BUN 7 9  CREATININE 0.92 0.71  CALCIUM 8.7 8.2*    ZOX:[id number]:  Two right-sided chest tubes in place with right base atelectasis  without discrete pneumothorax.   Assessment/Plan: S/P Procedure(s) (LRB): THORACOTOMY/LOBECTOMY (Right) VIDEO BRONCHOSCOPY (N/A) Chest tube without air leak, CXR stable.  Hopefully can d/c 1 CT today. CV- sinus tach, started on beta blocker.  HR still elevated with activity, BPs up.  May need to increase Lopressor if persists. Pulm- continue IS, wean O2. LGF overnight, probably due to atelectasis. WBC stable.  Will watch. Ambulate.   LOS: 2 days    COLLINS,GINA H 04/10/2012  Patient seen and examined, agree with above. Anterior CT out, no air leak

## 2012-04-11 ENCOUNTER — Inpatient Hospital Stay (HOSPITAL_COMMUNITY): Payer: Medicare Other

## 2012-04-11 MED ORDER — ALUM & MAG HYDROXIDE-SIMETH 200-200-20 MG/5ML PO SUSP
15.0000 mL | Freq: Four times a day (QID) | ORAL | Status: DC | PRN
  Administered 2012-04-11: 15 mL via ORAL
  Filled 2012-04-11: qty 30

## 2012-04-11 NOTE — Progress Notes (Addendum)
3 Days Post-Op Procedure(s) (LRB): THORACOTOMY/LOBECTOMY (Right) VIDEO BRONCHOSCOPY (N/A)  Subjective: Patient with indigestion and incisional pain.  Objective: Vital signs in last 24 hours: Patient Vitals for the past 24 hrs:  BP Temp Temp src Pulse Resp SpO2  04/11/12 0700 119/80 mmHg 98.4 F (36.9 C) Oral - - -  04/11/12 0348 120/72 mmHg 98.8 F (37.1 C) Oral 103  11  94 %  04/11/12 0000 112/79 mmHg 98.8 F (37.1 C) Oral - - -  04/10/12 2000 123/78 mmHg 98.1 F (36.7 C) Oral - - -  04/10/12 1600 113/80 mmHg - - 97  16  95 %  04/10/12 1539 - 98.7 F (37.1 C) Oral - - -  04/10/12 1207 - 99.4 F (37.4 C) Oral - - -  04/10/12 1200 133/69 mmHg - - - - -    Current Weight  04/09/12 157 lb 3 oz (71.3 kg)      Intake/Output from previous day: 06/07 0701 - 06/08 0700 In: 1580 [P.O.:1580] Out: 1900 [Urine:1700; Chest Tube:200]   Physical Exam:  Cardiovascular: Slightly tachy, no murmurs, gallops, or rubs. Pulmonary: Clear to auscultation on left;slightly diminished on right; no rales, wheezes, or rhonchi. Abdomen: Soft, non tender, bowel sounds present. Extremities: No lower extremity edema. Wounds: Dressing is clean and dry.   Lab Results: CBC: Basename 04/10/12 0350 04/09/12 0420  WBC 11.0* 11.5*  HGB 13.7 13.8  HCT 41.0 39.7  PLT 184 176   BMET:  Basename 04/10/12 0350 04/09/12 0420  NA 138 140  K 4.2 4.0  CL 101 107  CO2 29 25  GLUCOSE 142* 131*  BUN 7 9  CREATININE 0.92 0.71  CALCIUM 8.7 8.2*    PT/INR: No results found for this basename: LABPROT,INR in the last 72 hours ABG:  INR: Will add last result for INR, ABG once components are confirmed Will add last 4 CBG results once components are confirmed  Assessment/Plan:  1. CV - ST.On Lopressor 25 bid. 2.  Pulmonary - CT had 200 cc of output. There is some tidiling in the tube with cough. CXR this am shows minor subcutaneous emphysema on right, and some atelectasis at bases.Continue chest tube to  water seal for now.Encourage incentive spirometer.Check CXR in am. 3.Mylanta for indigestion.    ZIMMERMAN,DONIELLE MPA-C 04/11/2012   patient seen and examined, agree with above

## 2012-04-12 ENCOUNTER — Inpatient Hospital Stay (HOSPITAL_COMMUNITY): Payer: Medicare Other

## 2012-04-12 NOTE — Progress Notes (Addendum)
4 Days Post-Op Procedure(s) (LRB): THORACOTOMY/LOBECTOMY (Right) VIDEO BRONCHOSCOPY (N/A)  Subjective: Mylanta resolved indigestion yesterday. Has some pain at the chest tube site.  Objective: Vital signs in last 24 hours: Patient Vitals for the past 24 hrs:  BP Temp Temp src Pulse Resp SpO2  04/12/12 0732 105/69 mmHg 98.3 F (36.8 C) Oral - - -  04/12/12 0330 101/63 mmHg 98.9 F (37.2 C) Oral 97  9  94 %  04/11/12 2313 - 98.6 F (37 C) Oral - 13  95 %  04/11/12 1900 116/69 mmHg 98.7 F (37.1 C) Oral 106  24  95 %  04/11/12 1834 - 98.2 F (36.8 C) Oral - - -  04/11/12 1610 109/70 mmHg - - - - -  04/11/12 1231 - 98.9 F (37.2 C) Oral - - -  04/11/12 1200 119/80 mmHg - - - - -    Current Weight  04/09/12 157 lb 3 oz (71.3 kg)      Intake/Output from previous day: 06/08 0701 - 06/09 0700 In: 3049.2 [I.V.:3049.2] Out: 530 [Urine:500; Chest Tube:30]   Physical Exam:  Cardiovascular: Slightly tachy, no murmurs, gallops, or rubs. Pulmonary: Clear to auscultation on left;slightly diminished on right; no rales, wheezes, or rhonchi. Abdomen: Soft, non tender, bowel sounds present. Extremities: No lower extremity edema. Wounds: Dressing is clean and dry.   Lab Results: CBC:  Basename 04/10/12 0350  WBC 11.0*  HGB 13.7  HCT 41.0  PLT 184   BMET:   Basename 04/10/12 0350  NA 138  K 4.2  CL 101  CO2 29  GLUCOSE 142*  BUN 7  CREATININE 0.92  CALCIUM 8.7    PT/INR: No results found for this basename: LABPROT,INR in the last 72 hours ABG:  INR: Will add last result for INR, ABG once components are confirmed Will add last 4 CBG results once components are confirmed  Assessment/Plan:  1. CV - Previous ST.On Lopressor 25 bid. 2.  Pulmonary - CT had  40 cc of output last 24 hours. There is some tidiling in the tube with cough. CXR again this am shows minor subcutaneous emphysema on right, and some atelectasis at bases.Continue chest tube to water seal for  now.Hopefully remove ct soon.Encourage incentive spirometer.Check CXR in am.     ZIMMERMAN,DONIELLE MPA-C 04/12/2012  Pateient seen and examined. Agree with above No air leak and minimal drainage- d/c CT

## 2012-04-13 ENCOUNTER — Inpatient Hospital Stay (HOSPITAL_COMMUNITY): Payer: Medicare Other

## 2012-04-13 MED ORDER — METOPROLOL TARTRATE 12.5 MG HALF TABLET
12.5000 mg | ORAL_TABLET | Freq: Two times a day (BID) | ORAL | Status: DC
  Administered 2012-04-13: 12.5 mg via ORAL
  Filled 2012-04-13 (×2): qty 1

## 2012-04-13 MED ORDER — METOPROLOL TARTRATE 25 MG PO TABS: 12.5000 mg | ORAL_TABLET | Freq: Two times a day (BID) | ORAL | Status: DC

## 2012-04-13 MED ORDER — OXYCODONE-ACETAMINOPHEN 5-325 MG PO TABS: 1.0000 | ORAL_TABLET | ORAL | Status: AC | PRN

## 2012-04-13 NOTE — Discharge Summary (Signed)
Physician Discharge Summary  Patient ID: Patrick Lee MRN: [id number] DOB/AGE: [birth year] 51 y.o.  Admit date: 04/08/2012 Discharge date: 04/13/2012  Admission Diagnoses: 1.Cavitary lesion in the superior segment of the right lower lobe 2.History of tobacco abuse 3.History of pancreatic cancer (s/p Whipple)  Discharge Diagnoses:  1.Cavitary lesion in the superior segment of the right lower lobe 2.History of tobacco abuse 3.History of pancreatic cancer (s/p Whipple)  Procedure (s):  1. Flexible fiberoptic bronchoscopy.  2. Right muscle-sparing thoracotomy with wedge resection of superior segment lung lesion, right lower lobectomy by Dr. Laneta Simmers on 04/08/2012.  Pathology: Well differentiated adenocarcinoma of the RLL, margins were negative, and no tumor was seen in the lymph nodes. TNM Code: pT1a, pNO, MX  History of Presenting Illness: This is a 51 year old who smoked until just recently who has a history of pancreatic cancer (status post Whipple procedure at [facility] about 7 years ago). He said that he had positive lymph nodes and was treated with chemotherapy postoperatively. He has continued to do fairly well without evidence of recurrent cancer, although he has had some bile duct problems. He said that he had a CT scan the chest last summer at [facility] that showed a cavitary lesion in the right lower lobe measuring 1.2 x 0.8 cm. He said he underwent a needle biopsy which was negative. He had a followup CT scan in October 2012 which continued to show this lesion (which was new from previous scans in 2008). In December 201,2 he underwent bronchoscopy with bronchial washings which showed acute inflammatory cells, but no tumor identified. He underwent a PET scan on 01/23/2012 which showed mild FDG uptake within the cavitary superior segment lesion in the right lower lobe with a maximum SUV of 2.2 compared to the background of 1.4. There were no other abnormal areas of FDG  uptake. A followup CT scan the chest was done on 03/23/2012 which continued to show the spiculated cavitary lesion in the right lower lobe. It had not increased in size compared to the CT scan on 12/25/2011; however, it had increased in size compared to the CT scan from 08/22/2011. The lesion is now 1.5 x 1.2 cm.  Potential risks, complications, and benefits of lung surgery were discussed with the patient and he agreed to proceed.He was admitted to [facility] on 04/08/2012 in order to undergo a right muscle sparing thoracotomy and RLL.   Brief Hospital Course:  Patient remained afebrile and hemodynamically stable.His aline and foley were removed pod 1. There was no air leak and chest xrays remained stable. His chest tube was placed to water seal. He was felt surgically stable for transfer from the ICU to 3300 for further convalescence on pod 1.He did have sinus tachycardia and was placed on Lopressor.One of his chest tubes was removed on pod 2 and the remaining one was removed on pod 4.He has already been tolerating a diet and has had a bowel movement. His chest xray today shows tiny right apical pneumothorax, small right pleural effusion, right base atelectasis, and a small amount of subcutaneous emphysema along right chest wall.He has been oxygenating well on room air for days. He is felt surgically stable for discharge today.  Latest Vital Signs: Blood pressure 99/67, pulse 81, temperature 98.9 F (37.2 C), temperature source Oral, resp. rate 12, height 6' (1.829 m), weight 157 lb 3 oz (71.3 kg), SpO2 96.00%.  Physical Exam: Cardiovascular: RRR;no murmurs, gallops, or rubs.  Pulmonary: Clear to auscultation on left;slightly  diminished on right; no rales, wheezes, or rhonchi.  Abdomen: Soft, non tender, bowel sounds present.  Extremities: No lower extremity edema.  Wounds: Clean and dry.    Discharge Condition:Stable  Recent laboratory studies:  Lab Results  Component Value Date   WBC 11.0*  04/10/2012   HGB 13.7 04/10/2012   HCT 41.0 04/10/2012   MCV 101.2* 04/10/2012   PLT 184 04/10/2012   Lab Results  Component Value Date   NA 138 04/10/2012   K 4.2 04/10/2012   CL 101 04/10/2012   CO2 29 04/10/2012   CREATININE 0.92 04/10/2012   GLUCOSE 142* 04/10/2012      Diagnostic Studies: Dg Chest 2 View  04/13/2012  *RADIOLOGY REPORT*  Clinical Data: Right-sided chest pain.  Lobectomy.  CHEST - 2 VIEW  Comparison: 04/12/2012.  Findings: Left hemithorax appears unchanged.  Tiny right apical pneumothorax shows no interval change compared to prior exam. Subsegmental atelectasis at the right lung base.  Small right pleural effusion.  Cardiopericardial silhouette is within normal limits.  Subsegmental atelectasis at the right lung base.  Tiny amount of soft tissue emphysema along the right chest wall.  IMPRESSION:  1.  Stable tiny right apical pneumothorax. 2.  Unchanged support apparatus and appearance of the right lung.  Original Report Authenticated By: Andreas Newport, M.D.    Discharge Orders    Future Appointments: Provider: Department: Dept Phone: Center:   04/28/2012 4:30 PM Alleen Borne, MD [facility] [phone] [facility]      Discharge Medications: Medication List  As of 04/13/2012  8:17 AM   TAKE these medications         aspirin 325 MG tablet   Take 325 mg by mouth daily.      clonazePAM 1 MG tablet   Commonly known as: KLONOPIN   Take 1 mg by mouth 2 (two) times daily as needed. As needed for anxiety.      ibuprofen 200 MG tablet   Commonly known as: ADVIL,MOTRIN   Take 400-800 mg by mouth every 6 (six) hours as needed. As needed for pain.      metoprolol tartrate 25 MG tablet   Commonly known as: LOPRESSOR   Take 0.5 tablets (12.5 mg total) by mouth 2 (two) times daily.      mirtazapine 45 MG tablet   Commonly known as: REMERON   Take 45 mg by mouth at bedtime.      nitroGLYCERIN 0.4 MG SL tablet   Commonly known as: NITROSTAT   Place 0.4 mg under the tongue every 5 (five)  minutes as needed.      oxyCODONE-acetaminophen 5-325 MG per tablet   Commonly known as: PERCOCET   Take 1-2 tablets by mouth every 4 (four) hours as needed for pain.            Follow Up Appointments: Follow-up Information    Follow up with Alleen Borne, MD. (PA/LAT CXR to be taken on 04/28/2012 at 3:30 pm;Appointment with Dr. Laneta Simmers is on 04/28/2012 at 4:30 pm)    Contact information:   [address] [place] [address] [phone]          Signed: Doree Fudge MPA-C 04/13/2012, 8:17 AM

## 2012-04-13 NOTE — Progress Notes (Signed)
Utilization review completed.  

## 2012-04-13 NOTE — Care Management Note (Signed)
    Page 1 of 1   04/13/2012     11:16:26 AM   CARE MANAGEMENT NOTE 04/13/2012  Patient:  Patrick Lee, Patrick Lee   Account Number:  [id number]  Date Initiated:  04/09/2012  Documentation initiated by:  Donn Pierini  Subjective/Objective Assessment:   Pt admitted s/p thoracotomy/lobectomy     Action/Plan:   PTA pt lived at home   Anticipated DC Date:  04/13/2012   Anticipated DC Plan:  HOME/SELF CARE      DC Planning Services  CM consult      Choice offered to / List presented to:             Status of service:  Completed, signed off Medicare Important Message given?   (If response is "NO", the following Medicare IM given date fields will be blank) Date Medicare IM given:   Date Additional Medicare IM given:    Discharge Disposition:  HOME/SELF CARE  Per UR Regulation:  Reviewed for med. necessity/level of care/duration of stay  If discussed at Long Length of Stay Meetings, dates discussed:    Comments:  PCP- Christell Constant  [phone]- Donn Pierini RN, BSN [phone] Pt for discharge later today, no d/c needs.  04/09/12- 1530- Donn Pierini RN, BSN [phone] UR completed, pt transferred to SDU from ICU. NCM to follow for potential d/c needs.

## 2012-04-13 NOTE — Progress Notes (Signed)
5 Days Post-Op Procedure(s) (LRB): THORACOTOMY/LOBECTOMY (Right) VIDEO BRONCHOSCOPY (N/A)  Subjective: Patient did not sleep well. Wants to go home.  Objective: Vital signs in last 24 hours: Patient Vitals for the past 24 hrs:  BP Temp Temp src Pulse Resp SpO2  04/13/12 0733 - 98.9 F (37.2 C) Oral - - -  04/13/12 0330 - - - - 12  96 %  04/13/12 0305 99/67 mmHg 98.6 F (37 C) Oral 81  12  96 %  04/13/12 0000 - - - - 14  94 %  04/12/12 2359 99/64 mmHg 98.8 F (37.1 C) Oral 86  14  94 %  04/12/12 2319 - - - - 17  98 %  04/12/12 2000 - - - - 18  99 %  04/12/12 1952 115/73 mmHg 98.5 F (36.9 C) Oral 86  18  99 %  04/12/12 1529 96/61 mmHg 98.1 F (36.7 C) Oral - - -  04/12/12 1151 111/84 mmHg 98.3 F (36.8 C) Oral - - -    Current Weight  04/09/12 157 lb 3 oz (71.3 kg)      Intake/Output from previous day: 06/09 0701 - 06/10 0700 In: 840 [P.O.:240; I.V.:600] Out: 990 [Urine:950; Chest Tube:40]   Physical Exam:  Cardiovascular: RRR;no murmurs, gallops, or rubs. Pulmonary: Clear to auscultation on left;slightly diminished on right; no rales, wheezes, or rhonchi. Abdomen: Soft, non tender, bowel sounds present. Extremities: No lower extremity edema. Wounds:  Clean and dry.   Lab Results: CBC: No results found for this basename: WBC:2,HGB:2,HCT:2,PLT:2 in the last 72 hours BMET:  No results found for this basename: NA:2,K:2,CL:2,CO2:2,GLUCOSE:2,BUN:2,CREATININE:2,CALCIUM:2 in the last 72 hours  PT/INR: No results found for this basename: LABPROT,INR in the last 72 hours ABG:  INR: Will add last result for INR, ABG once components are confirmed Will add last 4 CBG results once components are confirmed  Assessment/Plan:  1. CV - Previous ST.On Lopressor 25 bid.SBP in the 90's so will decrease Lopressor to 12.5 bid. 2.  Pulmonary - CT removed yesterday. CXR this am is stable.It shows minor subcutaneous emphysema on right, and some atelectasis at bases, and probable  trace right apicl ptx.Marland Kitchen 3.Discharge home later today.    Miosha Behe MPA-C 04/13/2012

## 2012-04-20 ENCOUNTER — Other Ambulatory Visit: Payer: Self-pay | Admitting: Surgery

## 2012-04-20 DIAGNOSIS — D381 Neoplasm of uncertain behavior of trachea, bronchus and lung: Secondary | ICD-10-CM

## 2012-04-28 ENCOUNTER — Encounter: Payer: Self-pay | Admitting: Surgery

## 2012-04-28 ENCOUNTER — Ambulatory Visit (INDEPENDENT_AMBULATORY_CARE_PROVIDER_SITE_OTHER): Payer: Self-pay | Admitting: Surgery

## 2012-04-28 ENCOUNTER — Ambulatory Visit
Admission: RE | Admit: 2012-04-28 | Discharge: 2012-04-28 | Disposition: A | Discharge status: Home / Self Care | Payer: Medicare Other | Source: Ambulatory Visit | Attending: Surgery | Admitting: Surgery

## 2012-04-28 VITALS — BP 107/76 | HR 76 | Resp 16 | Ht 72.0 in | Wt 150.0 lb

## 2012-04-28 DIAGNOSIS — C3430 Malignant neoplasm of lower lobe, unspecified bronchus or lung: Secondary | ICD-10-CM

## 2012-04-28 DIAGNOSIS — D381 Neoplasm of uncertain behavior of trachea, bronchus and lung: Secondary | ICD-10-CM

## 2012-04-28 DIAGNOSIS — Z09 Encounter for follow-up examination after completed treatment for conditions other than malignant neoplasm: Secondary | ICD-10-CM

## 2012-04-28 NOTE — Progress Notes (Signed)
[address]            [place] Lee [address]          [phone]     HPI:  Patient returns for routine postoperative follow-up having undergone right thoracotomy and right lower lobectomy for a cavitary lung lesion in the superior segment of the right lower lobe on 04/08/2012. The patient's early postoperative recovery while in the hospital was notable for an uncomplicated postoperative course. Since hospital discharge the patient reports he continues to have some pain and numbness along the right lateral chest wall extending from beneath the right scapula to the right anterior chest wall.   Current Outpatient Prescriptions  Medication Sig Dispense Refill  . aspirin 325 MG tablet Take 325 mg by mouth daily.       . clonazePAM (KLONOPIN) 1 MG tablet Take 1 mg by mouth 2 (two) times daily as needed. As needed for anxiety.      Marland Kitchen ibuprofen (ADVIL,MOTRIN) 200 MG tablet Take 400-800 mg by mouth every 6 (six) hours as needed. As needed for pain.      . metoprolol tartrate (LOPRESSOR) 25 MG tablet Take 0.5 tablets (12.5 mg total) by mouth 2 (two) times daily.  30 tablet  1  . mirtazapine (REMERON) 45 MG tablet Take 45 mg by mouth at bedtime.      . nitroGLYCERIN (NITROSTAT) 0.4 MG SL tablet Place 0.4 mg under the tongue every 5 (five) minutes as needed.          Physical Exam: BP 107/76  Pulse 76  Resp 16  Ht 6' (1.829 m)  Wt 150 lb (68.04 kg)  BMI 20.34 kg/m2  SpO2 98% He looks well. Lung exam is clear. The right thoracotomy incision is healing well.  Diagnostic Tests:  Diagnosis 1. Lung, wedge biopsy/resection, Right lower - WELL DIFFERENTIATED ADENOCARCINOMA, SPANNING 1.6 CM. - DEFINITIVE PLEURAL INVASION NOT IDENTIFIED. - NO LYMPH/VASCULAR INVASION IDENTIFIED. - MARGIN IS NEGATIVE. - SEE COMMENT. 2. Lung, resection (segmental or lobe), Right lower - BENIGN LUNG PARENCHYMA. - ONE BENIGN LYMPH NODE WITH NO TUMOR SEEN (0/1). Microscopic  Comment 1. Immunohistochemical stains are performed on the carcinoma. The carcinoma is strongly positive for cytokeratin 7 with patchy positivity for CDX-2. TTF-1, cytokeratin 20 and Napsin are all negative. A history of pancreatic cancer is noted. Although the current case is being staged as a primary lung adenocarcinoma, based on the immunohistochemical staining pattern and morphology, it is difficult to entirely exclude a metastatic pancreatobiliary carcinoma. Close clinical and radiologic correlation are essential. Dr. Frederica Lee has seen this case in consultation with agreement of the primary diagnosis and above microscopic comment. LUNG Specimen, including laterality: Right lower lung wedge biopsy with right lower lobectomy. Procedure: Right lower lung wedge biopsy with right lower lobectomy. Specimen integrity (intact/disrupted): Intact. Tumor site: Right lower lobe superior segment. Tumor focality: Unifocal. Maximum tumor size (cm): 1.6 cm. Histologic type: Adenocarcinoma. Grade: Well differentiated, low grade. Margins: Negative. Visceral pleura invasion: Definitive pleural invasion not identified. See below comment. Tumor extension: Tumor confined to lung parenchyma. Treatment effect (if treated with neoadjuvant therapy): N/A. Lymph -Vascular invasion: Not identified. 1 of 3 FINAL for Patrick Lee, Patrick Lee ([id number]) Microscopic Comment(continued) Lymph nodes: Number examined - 1 ; Number N1 nodes positive - 0 ; Number N2 nodes positive - 0. TNM code: pT1a, pN0, MX. Ancillary studies: Can be performed  upon request. Non-neoplastic lung: Unremarkable. Comment: An orcein stain is performed on a single block where the tumor closely approaches the pleura. The orcein stain demonstrates that the tumor does not clearly invade into the visceral pleura. (RAH:gt, 04/10/12) Patrick Abts MD Pathologist, Electronic Signature (Case signed 04/10/2012) Intraoperative Diagnosis 1. LUNG, RIGHT LOWER  LOBE SUPERIOR SEGMENT WEDGE RESECTION: - FROZEN SECTION A. NEAREST PARENCHYMAL MARGIN - NEGATIVE. - FROZEN SECTION B. MASS - POSITIVE FOR NON-SMALL CELL CARCINOMA, FAVOR ADENOCARCINOMA. (RAH) Specimen Gross and Clinical Information Specimen(s) Obtained: 1. Lung, wedge biopsy/resection, Right lower 2. Lung, resection (segmental or lobe), Right lower Specimen Clinical Information 1. Lung Mass (tl) Gross 1. Specimen: Wedge resection of superior segment of right lower lobe. Specimen integrity (intact/incised/disrupted): Intact. Size, weight: 9 grams, 7.6 x 3.5 x 1.8 cm. Pleura: Pink to hyperemic, slightly anthracotic, focally umbilicated. There are staples along one edge. Cut Surface: On sectioning through the umbilicated area of pleura, there is a 1.6 x 1.5 x 1.2 cm tan to tan red firm ill defined mass. Margin(s): The mass is 1.1 cm from the nearest stapled margin. Block Summary: Tissue adjacent to the nearest stapled margin is submitted in one block labeled A for frozen section. A section of the mass is submitted in one block labeled B for frozen section. Additional sections of mass and surrounding tissue are submitted in block C, D for routine histology. Total 4 blocks. 2. Specimen: Right lower lobectomy. Specimen integrity (intact/incised/disrupted): There are staples along two area. Size, weight: 208 grams, 15 x 11 x 5 cm. Pleura: Pink red to red purple, moderately anthracotic, smooth. Lesion: None identified. Margin(s): Unremarkable. Hilar vessels: Unremarkable. Nonneoplastic parenchyma: Tan to tan red, spongy with slight anthracosis. Lymph nodes, level 12: At the hilum is a 0.7 cm soft anthracotic nodule. Block Summary: A= shave of bronchial margin. B,C= tissue adjacent to stapled areas. D= random sections. E= anthracotic nodule and hilum. Total 6 blocks. (SW:gt, 04/09/12) 2 of   RADIOLOGY REPORT*   Clinical Data: Post right-sided lung surgery, follow-up appointment   CHEST  - 2 VIEW   Comparison: 04/13/2012; 04/12/2012; 04/09/2012   Findings: Normal cardiac silhouette and mediastinal contours. Interval removal of right subclavian vein approach central venous catheter.  Unchanged small right-sided pleural effusion and right basilar heterogeneous opacities.  No definite pneumothorax.  No new focal airspace opacities.  Unchanged bones.  Post cholecystectomy.   IMPRESSION: Unchanged small right-sided effusion and right basilar opacities, likely atelectasis.  No pneumothorax.   Original Report Authenticated By: Waynard Reeds, M.D.  Impression:  He is recovering well following right lower lobectomy for a T1A,N0 well-differentiated adenocarcinoma presumably  of the lung. Based on the immunohistochemical staining pattern and morphology is not possible to entirely exclude a pancreatobiliary metastatic lesion. I would think it would be very unusual to have a single metastatic lesion to the lung this far out from his pancreatic resection.  Plan:  I wrote him a prescription today for oxycodone IR 5 mg to 10 mg by mouth every 6 hours when necessary for pain #50. I will plan to see him back in 3 months with a chest x-ray. I will plan to repeat his CT scan of the chest at the six-month interval. He has an appointment with his oncologist at [facility] in August.

## 2012-05-07 ENCOUNTER — Emergency Department (HOSPITAL_COMMUNITY): Payer: Medicare Other

## 2012-05-07 ENCOUNTER — Emergency Department (HOSPITAL_COMMUNITY)
Admission: EM | Admit: 2012-05-07 | Discharge: 2012-05-07 | Disposition: A | Discharge status: Home / Self Care | Payer: Medicare Other | Attending: Emergency Medicine | Admitting: Emergency Medicine

## 2012-05-07 ENCOUNTER — Encounter (HOSPITAL_COMMUNITY): Payer: Self-pay | Admitting: *Deleted

## 2012-05-07 DIAGNOSIS — R079 Chest pain, unspecified: Secondary | ICD-10-CM | POA: Insufficient documentation

## 2012-05-07 DIAGNOSIS — F411 Generalized anxiety disorder: Secondary | ICD-10-CM | POA: Insufficient documentation

## 2012-05-07 DIAGNOSIS — R0781 Pleurodynia: Secondary | ICD-10-CM

## 2012-05-07 DIAGNOSIS — Z87891 Personal history of nicotine dependence: Secondary | ICD-10-CM | POA: Insufficient documentation

## 2012-05-07 DIAGNOSIS — I252 Old myocardial infarction: Secondary | ICD-10-CM | POA: Insufficient documentation

## 2012-05-07 DIAGNOSIS — W19XXXA Unspecified fall, initial encounter: Secondary | ICD-10-CM | POA: Insufficient documentation

## 2012-05-07 DIAGNOSIS — Z8509 Personal history of malignant neoplasm of other digestive organs: Secondary | ICD-10-CM | POA: Insufficient documentation

## 2012-05-07 MED ORDER — OXYCODONE-ACETAMINOPHEN 5-325 MG PO TABS: 1.0000 | ORAL_TABLET | Freq: Four times a day (QID) | ORAL | Status: AC | PRN

## 2012-05-07 MED ORDER — HYDROMORPHONE HCL PF 1 MG/ML IJ SOLN
1.0000 mg | Freq: Once | INTRAMUSCULAR | Status: AC
  Administered 2012-05-07: 1 mg via INTRAMUSCULAR
  Filled 2012-05-07: qty 1

## 2012-05-07 NOTE — ED Notes (Signed)
The pt had a portion of his lung removed 4 weeks ago rt sided.  Today he fell striking his rt lower rib pain.  He felt something pop.  Severe pain since then

## 2012-05-07 NOTE — ED Notes (Signed)
Pt states decrease in pain ambulates without distress

## 2012-05-07 NOTE — ED Notes (Signed)
He has lung cancer

## 2012-05-07 NOTE — ED Provider Notes (Signed)
History     CSN: [id number]  Arrival date & time 05/07/12  1918   First MD Initiated Contact with Patient 05/07/12 1926      Chief Complaint  Patient presents with  . Rib Injury    (Consider location/radiation/quality/duration/timing/severity/associated sxs/prior treatment) HPI Comments: Patient reports that just prior to arrival he tripped and fell.  When he fell he twisted his trunk and is having pain of the lower right ribs.  He reports that he felt a pop.  He describes the pain as sharp.  Pain has been constant since that time.  Pain does not radiate.  Pain worse with deep breaths and with palpation.  He did not have any pain prior to the fall.  He denies any shortness of breath, nausea, vomiting, or diaphoresis.  PMH significant for RLL lobectomy done secondary to lung cancer approximately 4 weeks ago.  He also has a history of Pancreatic Cancer and had a Whipple Procedure done 7 years ago.    The history is provided by the patient.    Past Medical History  Diagnosis Date  . Lateral epicondylitis   . Chest pain, unspecified   . Abdominal  pain, other specified site   . Obstruction of bile duct   . Tobacco use disorder   . Pancreatic cancer      status post Whipple procedure.  . Anxiety   . Shortness of breath     uses oxygen at night- 2 liters   . 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 Complication of anesthesia     pt. reports that he had MI while under anesth. for Whipple procedure    Past Surgical History  Procedure Date  . Whipple procedure   . [facility]ercut[facility] gallstones     [facility]  . Knee arthroscopy     R knee- at [facility]  . Cholecystectomy     open  . Video bronchoscopy 04/08/2012    Procedure: VIDEO BRONCHOSCOPY;  Surgeon: Alleen Borne, MD;  Location: [facility] OR;  Service: Thoracic;  Laterality: N/A;    Family History  Problem Relation Age of Onset  . Stroke Father 43    Deceased  . Anesthesia  problems Neg Hx     History  Substance Use Topics  . Smoking status: Former Smoker -- 1.0 packs/day for 28 years    Types: Cigarettes    Quit date: 01/02/2012  . Smokeless tobacco: Never Used  . Alcohol Use: Yes      Rare alcohol use      Review of Systems  Constitutional: Negative for fever, chills and diaphoresis.  HENT: Negative for neck pain and neck stiffness.   Respiratory: Negative for shortness of breath.   Gastrointestinal: Negative for nausea, vomiting and abdominal pain.  Skin: Negative for color change.  Neurological: Negative for dizziness, syncope, light-headedness and numbness.  All other systems reviewed and are negative.    Allergies  Review of patient's allergies indicates no known allergies.  Home Medications   Current Outpatient Rx  Name Route Sig Dispense Refill  . ASPIRIN 325 MG PO TABS Oral Take 325 mg by mouth daily.     Marland Kitchen CLONAZEPAM 1 MG PO TABS Oral Take 1 mg by mouth 2 (two) times daily as needed. As needed for anxiety.    . IBUPROFEN 200 MG PO TABS Oral Take 400-800 mg by mouth every 6 (six) hours as needed. As needed for pain.    Marland Kitchen  MIRTAZAPINE 45 MG PO TABS Oral Take 45 mg by mouth at bedtime.    Marland Kitchen NITROGLYCERIN 0.4 MG SL SUBL Sublingual Place 0.4 mg under the tongue every 5 (five) minutes as needed. For chest pain      BP 128/88  Pulse 83  Temp 98.7 F (37.1 C) (Oral)  Resp 11  SpO2 99%  Physical Exam  Nursing note and vitals reviewed. Constitutional: He appears well-developed and well-nourished.       Uncomfortable appearing  HENT:  Head: Normocephalic and atraumatic.  Mouth/Throat: Oropharynx is clear and moist.  Neck: Normal range of motion. Neck supple.  Cardiovascular: Normal rate, regular rhythm and normal heart sounds.   Pulmonary/Chest: Effort normal and breath sounds normal. No respiratory distress. He has no wheezes. He has no rales. He exhibits tenderness and bony tenderness. He exhibits no mass, no edema, no deformity  and no swelling.    Abdominal: Soft. Bowel sounds are normal. He exhibits no distension. There is no tenderness.  Musculoskeletal: Normal range of motion. He exhibits no edema and no tenderness.  Neurological: He is alert.  Skin: Skin is warm, dry and intact. No bruising and no ecchymosis noted. He is not diaphoretic. No erythema.  Psychiatric: He has a normal mood and affect.    ED Course  Procedures (including critical care time)  Labs Reviewed - No data to display Dg Ribs Unilateral W/chest Right  05/07/2012  *RADIOLOGY REPORT*  Clinical Data: Shortness of breath.  Rib injury, right-sided pain.  RIGHT RIBS AND CHEST - 3+ VIEW  Comparison: 04/28/2012  Findings: Linear densities at the right base, likely scarring or atelectasis.  Heart is normal size.  Left lung is clear.  No visible right rib fracture or pneumothorax.  IMPRESSION: Right base scarring or atelectasis.  No visualized rib fracture or pneumothorax.  Original Report Authenticated By: Cyndie Chime, M.D.     No diagnosis found.   Date: 05/08/2012  Rate: 80  Rhythm: normal sinus rhythm  QRS Axis: normal  Intervals: normal  ST/T Wave abnormalities: normal  Conduction Disutrbances:none  Narrative Interpretation:   Old EKG Reviewed: none available  Patient discussed with Dr. Estell Harpin.    MDM  Patient reports that he began having pain of the lower right ribs after he fell just prior to arrival.  Xray negative.  Patient discharged home with pain medications.  Patient also instructed to use his incentive spirometry.  Patient in agreement with the plan.  Return precautions discussed.        Pascal Lux [place], PA-C 05/08/12 1135

## 2012-05-08 NOTE — ED Provider Notes (Signed)
Medical screening examination/treatment/procedure(s) were performed by non-physician practitioner and as supervising physician I was immediately available for consultation/collaboration.   Mariane Burpee L Vincenzina Jagoda, MD 05/08/12 1518 

## 2012-07-28 ENCOUNTER — Encounter: Payer: Medicare Other | Admitting: Surgery

## 2012-08-10 ENCOUNTER — Other Ambulatory Visit: Payer: Self-pay | Admitting: Surgery

## 2012-08-10 DIAGNOSIS — Z9889 Other specified postprocedural states: Secondary | ICD-10-CM

## 2012-08-11 ENCOUNTER — Encounter: Payer: Medicare Other | Admitting: Surgery

## 2012-08-11 ENCOUNTER — Ambulatory Visit
Admission: RE | Admit: 2012-08-11 | Discharge: 2012-08-11 | Disposition: A | Discharge status: Home / Self Care | Payer: Medicare Other | Source: Ambulatory Visit | Attending: Surgery | Admitting: Surgery

## 2012-08-11 ENCOUNTER — Ambulatory Visit (INDEPENDENT_AMBULATORY_CARE_PROVIDER_SITE_OTHER): Payer: Medicare Other | Admitting: Surgery

## 2012-08-11 ENCOUNTER — Encounter: Payer: Self-pay | Admitting: Surgery

## 2012-08-11 VITALS — BP 112/75 | HR 100 | Resp 18 | Ht 72.0 in | Wt 152.0 lb

## 2012-08-11 DIAGNOSIS — Z902 Acquired absence of lung [part of]: Secondary | ICD-10-CM

## 2012-08-11 DIAGNOSIS — Z9889 Other specified postprocedural states: Secondary | ICD-10-CM

## 2012-08-11 DIAGNOSIS — C3430 Malignant neoplasm of lower lobe, unspecified bronchus or lung: Secondary | ICD-10-CM

## 2012-08-11 DIAGNOSIS — Z85118 Personal history of other malignant neoplasm of bronchus and lung: Secondary | ICD-10-CM | POA: Insufficient documentation

## 2012-08-11 DIAGNOSIS — Z09 Encounter for follow-up examination after completed treatment for conditions other than malignant neoplasm: Secondary | ICD-10-CM

## 2012-08-11 NOTE — Progress Notes (Signed)
                    [address]            [name] [address]          [phone]      HPI:  Patient returns for routine postoperative follow-up having undergone right thoracotomy and right lower lobectomy for a cavitary lung lesion in the superior segment of the right lower lobe on 04/08/2012. The patient's early postoperative recovery while in the hospital was notable for an uncomplicated postoperative course.  Since I last saw him in the office he has been feeling well. He is trying to maintain an active lifestyle. He has minimal chest wall discomfort. He has slight numbness along the incision. He denies any cough or sputum production. He has had no hemoptysis.  Current Outpatient Prescriptions  Medication Sig Dispense Refill  . aspirin 325 MG tablet Take 325 mg by mouth daily.       Marland Kitchen ibuprofen (ADVIL,MOTRIN) 200 MG tablet Take 400-800 mg by mouth every 6 (six) hours as needed. As needed for pain.         Physical Exam: BP 112/75  Pulse 100  Resp 18  Ht 6' (1.829 m)  Wt 152 lb (68.947 kg)  BMI 20.61 kg/m2  SpO2 98% He looks well. Cardiac exam shows a regular rate and rhythm with normal heart sounds. Lung exam is clear. The right chest incision is healing well.  Diagnostic Tests:  Chest x-ray today shows clear lung fields. No pleural effusions. There is no sign of recurrent cancer  Impression:  He is doing well 4 months status post right lower lobectomy for a T1a, N0 adenocarcinoma of the lung.  Plan:  I will plan to see him back in 3 months and we'll do a chest x-ray at that time.

## 2012-11-05 ENCOUNTER — Other Ambulatory Visit: Payer: Self-pay | Admitting: *Deleted

## 2012-11-05 DIAGNOSIS — C3430 Malignant neoplasm of lower lobe, unspecified bronchus or lung: Secondary | ICD-10-CM

## 2012-11-05 DIAGNOSIS — R079 Chest pain, unspecified: Secondary | ICD-10-CM

## 2012-11-10 ENCOUNTER — Encounter: Payer: Medicare Other | Admitting: Thoracic Surgery (Cardiothoracic Vascular Surgery)

## 2012-12-08 ENCOUNTER — Encounter: Payer: Medicare Other | Admitting: Surgery

## 2012-12-11 ENCOUNTER — Encounter: Payer: Medicare Other | Admitting: Surgery

## 2013-01-12 ENCOUNTER — Ambulatory Visit
Admission: RE | Admit: 2013-01-12 | Discharge: 2013-01-12 | Disposition: A | Discharge status: Home / Self Care | Payer: Medicare Other | Source: Ambulatory Visit | Attending: Surgery | Admitting: Surgery

## 2013-01-12 ENCOUNTER — Encounter: Payer: Self-pay | Admitting: Surgery

## 2013-01-12 ENCOUNTER — Ambulatory Visit (INDEPENDENT_AMBULATORY_CARE_PROVIDER_SITE_OTHER): Payer: Medicare Other | Admitting: Surgery

## 2013-01-12 VITALS — BP 124/82 | HR 67 | Resp 18 | Ht 72.0 in | Wt 171.0 lb

## 2013-01-12 NOTE — Progress Notes (Signed)
   [address]       [place] [address]             [phone]      HPI:  Patient returns for routine surveillance follow-up having undergone right thoracotomy and right lower lobectomy for a T1a, N0 well-differentiated adenocarcinoma in the superior segment of the right lower lobe on 04/08/2012. He has a history of Whipple procedure about 6 1/2 years ago for Stage IIB  T3 N1 M0 pancreatic cancer at [facility] by Dr. Deveron Furlong. Since I last saw him  3 months ago he said that he has been feeling well. She denies any headaches or visual changes. He denies shortness of breath. He has had no cough or sputum production. He has been eating well and his weight is stable. He denies any bone or muscle pain.   Current Outpatient Prescriptions  Medication Sig Dispense Refill  . aspirin 325 MG tablet Take 325 mg by mouth daily.       Marland Kitchen ibuprofen (ADVIL,MOTRIN) 200 MG tablet Take 400-800 mg by mouth every 6 (six) hours as needed. As needed for pain.      . meloxicam (MOBIC) 15 MG tablet Take 15 mg by mouth daily.        No current facility-administered medications for this visit.     Physical Exam:  BP 124/82  Pulse 67  Resp 18  Ht 6' (1.829 m)  Wt 171 lb (77.565 kg)  BMI 23.19 kg/m2  SpO2 98% He looks well. There is no cervical or supraclavicular adenopathy. Lung exam is clear. The right thoracotomy scar looks fine. There are no skin lesions. Cardiac exam shows a regular rate and rhythm with normal heart sounds. Abdominal exam shows active bowel sounds. Abdomen is soft and nontender. There are no palpable masses or organomegaly. The prior laparotomy scar looks fine.  Diagnostic Tests:  *RADIOLOGY REPORT*   Clinical Data: Post right lower lobectomy for resection of non- small cell lung carcinoma, follow-up   CHEST - 2 VIEW   Comparison: Chest x-ray of 08/11/2012   Findings: No active infiltrate or effusion is seen.  Changes of right  lower lobectomy are noted.  Mild scarring remains at the right lung base.  The heart is within normal limits in size.  No bony abnormality is seen.   IMPRESSION: Stable chest x-ray.  No active lung disease.     Original Report Authenticated By: Dwyane Dee, M.D.   Impression:  Overall he continues to do well following resection of a stage IA adenocarcinoma of the lung. There is no sign of recurrent cancer on chest x-ray. I would typically do a CT scan of the chest one year postoperatively but he said that he is scheduled to have a CT scan the chest, abdomen and pelvis for followup of his pancreatic cancer in August of 2014. Therefore I will plan to do a chest x-ray when I see him back in June and will followup on his CT scans that are done in August at [facility].  Plan:  He will return to see me in June with a chest x-ray.

## 2013-02-22 ENCOUNTER — Ambulatory Visit (INDEPENDENT_AMBULATORY_CARE_PROVIDER_SITE_OTHER): Payer: Medicare Other | Admitting: Family Medicine

## 2013-02-22 ENCOUNTER — Encounter: Payer: Self-pay | Admitting: Family Medicine

## 2013-02-22 VITALS — BP 111/80 | HR 83 | Temp 97.5°F | Ht 72.0 in | Wt 170.0 lb

## 2013-02-22 DIAGNOSIS — C3430 Malignant neoplasm of lower lobe, unspecified bronchus or lung: Secondary | ICD-10-CM

## 2013-02-22 DIAGNOSIS — Z1211 Encounter for screening for malignant neoplasm of colon: Secondary | ICD-10-CM

## 2013-02-22 DIAGNOSIS — Z0000 Encounter for general adult medical examination without abnormal findings: Secondary | ICD-10-CM

## 2013-02-22 DIAGNOSIS — Z8509 Personal history of malignant neoplasm of other digestive organs: Secondary | ICD-10-CM

## 2013-02-22 DIAGNOSIS — Z8507 Personal history of malignant neoplasm of pancreas: Secondary | ICD-10-CM

## 2013-02-22 LAB — COMPLETE METABOLIC PANEL WITH GFR
ALT: 29 U/L (ref 0–53)
AST: 44 U/L — ABNORMAL HIGH (ref 0–37)
Albumin: 3.8 g/dL (ref 3.5–5.2)
Alkaline Phosphatase: 68 U/L (ref 39–117)
BUN: 7 mg/dL (ref 6–23)
CO2: 29 mEq/L (ref 19–32)
Calcium: 9.3 mg/dL (ref 8.4–10.5)
Chloride: 106 mEq/L (ref 96–112)
Creat: 1.05 mg/dL (ref 0.50–1.35)
GFR, Est African American: >89 mL/min
GFR, Est Non African American: 81 mL/min
Glucose, Bld: 91 mg/dL (ref 70–99)
Potassium: 5.2 mEq/L (ref 3.5–5.3)
Sodium: 141 mEq/L (ref 135–145)
Total Bilirubin: 0.4 mg/dL (ref 0.3–1.2)
Total Protein: 6.4 g/dL (ref 6.0–8.3)

## 2013-02-22 LAB — POCT CBC
Granulocyte percent: 61 %G (ref 37–80)
HCT, POC: 42 % — AB (ref 43.5–53.7)
Hemoglobin: 15 g/dL (ref 14.1–18.1)
Lymph, poc: 1.6 (ref 0.6–3.4)
MCH, POC: 35.6 pg — AB (ref 27–31.2)
MCHC: 35.6 g/dL — AB (ref 31.8–35.4)
MCV: 100 fL — AB (ref 80–97)
MPV: 6.7 fL (ref 0–99.8)
POC Granulocyte: 3.2 (ref 2–6.9)
POC LYMPH PERCENT: 29.9 %L (ref 10–50)
Platelet Count, POC: 213 10*3/uL (ref 142–424)
RBC: 4.2 M/uL — AB (ref 4.69–6.13)
RDW, POC: 12.6 %
WBC: 5.2 10*3/uL (ref 4.6–10.2)

## 2013-02-22 LAB — HEPATITIS B SURFACE ANTIGEN: Hepatitis B Surface Ag: NEGATIVE

## 2013-02-22 LAB — HEPATITIS C ANTIBODY: HCV Ab: NEGATIVE

## 2013-02-22 LAB — HIV ANTIBODY (ROUTINE TESTING W REFLEX): HIV: NONREACTIVE

## 2013-02-22 NOTE — Patient Instructions (Addendum)
Immunizations: Tetanus-Diphtheria Booster due:now Pertusis Booster due:now Flu Shot Due: in fALL Pneumonia Vaccine: 2010 Herpes Zoster/Shingles Vaccine due:at 19 to 53 years old HPV due:n/a  Healthy Life Habits: Exercise Goal: 5-6 days/week; start gradually(ie 30 minutes/3days per week) Nutrition: Balanced healthy meals including Vegetables and Fruits. Consider  Reading the following books:1) Eat to Live by Dr Ottis Stain; 2) Prevent and Reverse Heart Disease by Dr Suzzette Righter.  Vitamins:multivitamin Aspirin: 81 mg Stop Tobacco Use: avoid  smoking Seat Belt Use:++++ Sunscreen Use:++++ Osteoporosis Prevention: 1) Exercise 2) Calcium/Vitamin D requirements:he [employer] of the [employer] recommends:    Calcium:  800 mg/day for children 61-16 years of age          53 mg/day for children 71-63 years of age          53 mg/day for adults 61-10 years of age          53 mg/day for everyone more than 53 years of age     Vitamin D: 800 IU per day or as prescribed if you are deficient.  Recommended Screening Tests: Colon Cancer Screening:due for colonoscopy will refer Blood work: Cholesterol Screening:  +++      HIV:   +++      Hepatitis C(people born 1945-1965):+++ Mammogram:n/a DEXA/Bone Density:n/a GYN Exam:n/a Monthly Self Breast Exam:n/a Monthly Self Testicular Exam:++++ Eye Exam:++++every 1 to 2 years Dental Health:++++cleaning every 6months  Others: Living Will/Healthcare Power of Attorney:++++  RTC in 4 weeks for skin Biopsy.

## 2013-02-22 NOTE — Progress Notes (Signed)
Patient ID: Patrick Lee, male   DOB: 12-31-1960, 52 y.o.   MRN: [id number] SUBJECTIVE: HPI: Annual physical. H/O pancreatic cancer. H/o Lung Cancer  PMH/PSH: reviewed/updated in Epic  SH/FH: reviewed/updated in Epic  Allergies: reviewed/updated in Epic  Medications: reviewed/updated in Epic  Immunizations: reviewed/updated in Epic  ROS: As above in the HPI. All other systems are stable or negative.  OBJECTIVE: APPEARANCE:  Patient in no acute distress.The patient appeared well nourished and normally developed. Acyanotic. Waist: VITAL SIGNS:  SKIN: warm and  Dry without overt rashes, tattoos and scars. Has a red scaly lesion on the proximal lateral right thigh approximately 4 mm diameter.  HEAD and Neck: without JVD, Head and scalp: normal Eyes:No scleral icterus. Fundi normal, eye movements normal. Ears: Auricle normal, canal normal, Tympanic membranes normal, insufflation normal. Nose: normal Throat: normal Neck & thyroid: normal  CHEST & LUNGS: Chest wall:surgical scar from thoracotomy on the right Lungs: Clear, coarse breath sounds.  CVS: Reveals the PMI to be normally located. Regular rhythm, First and Second Heart sounds are normal,  absence of murmurs, rubs or gallops. Peripheral vasculature: Radial pulses: normal Dorsal pedis pulses: normal Posterior pulses: normal  ABDOMEN:  Appearance: central surgical scar. Benign,, no organomegaly, no masses, no Abdominal Aortic enlargement. No Guarding , no rebound. No Bruits. Bowel sounds: normal  RECTAL: N/A GU: N/A  EXTREMETIES: nonedematous. Both Femoral and Pedal pulses are normal.  MUSCULOSKELETAL:  Spine: normal Joints: intact  NEUROLOGIC: oriented to time,place and person; nonfocal. Strength is normal Sensory is normal Reflexes are normal Cranial Nerves are normal.  ASSESSMENT: Annual physical exam - Plan: NMR Lipoprofile with Lipids, Hepatitis C antibody, HIV antibody, Hepatitis B surface  antigen, PSA, Ambulatory referral to Gastroenterology  Malignant neoplasm of lower lobe, bronchus, or lung, unspecified laterality  History of pancreatic cancer - Plan: POCT CBC, COMPLETE METABOLIC PANEL WITH GFR  Screening for colon cancer - Plan: Ambulatory referral to Gastroenterology    PLAN: Health Maintenance:  Immunizations: Tetanus-Diphtheria Booster due:now patient will check with health department due to cost. Pertusis Booster due:now Flu Shot Due: in fALL Pneumonia Vaccine: 2010 Herpes Zoster/Shingles Vaccine due:at 75 to 53 years old HPV due:  Healthy Life Habits: Exercise Goal: 5-6 days/week; start gradually(ie 30 minutes/3days per week) Nutrition: Balanced healthy meals including Vegetables and Fruits. Consider  Reading the following books:1) Eat to Live by Dr Louie Casa) Prevent and Reverse Heart Disease by Dr Suzzette Righter. Vitamins: Aspirin: Stop Tobacco Use: avoid  smoking Seat Belt Use:++++ Sunscreen Use:++++ Osteoporosis Prevention: 1) Exercise 2) Calcium/Vitamin D requirements:he [employer] of the [employer] recommends:    Calcium:  800 mg/day for children 59-49 years of age          53 mg/day for children 82-53 years of age          53 mg/day for adults 20-100 years of age          53 mg/day for everyone more than 53 years of age     Vitamin D: 800 IU per day or as prescribed if you are deficient.  Recommended Screening Tests: Colon Cancer Screening:due for colonoscopy Blood work: Cholesterol Screening:  +++      HIV:   +++      Hepatitis C(people born 1945-1965):+++ Mammogram:n/a DEXA/Bone Density:n/a GYN Exam:n/a Monthly Self Breast Exam:n/a Monthly Self Testicular Exam:++++ Eye Exam:++++ Dental Health:++++  Others: Living Will/Healthcare Power of Attorney:++++  Orders Placed This Encounter  Procedures  . COMPLETE  METABOLIC PANEL WITH GFR  . NMR Lipoprofile with Lipids  . Hepatitis C antibody  .  HIV antibody  . Hepatitis B surface antigen  . PSA  . Ambulatory referral to Gastroenterology    Referral Priority:  Routine    Referral Type:  Consultation    Referral Reason:  Specialty Services Required    Requested Specialty:  Gastroenterology    Number of Visits Requested:  1  . POCT CBC   Results for orders placed in visit on 02/22/13 (from the past 24 hour(s))  POCT CBC     Status: Abnormal   Collection Time    02/22/13  4:35 PM      Result Value Range   WBC 5.2  4.6 - 10.2 K/uL   Lymph, poc 1.6  0.6 - 3.4   POC LYMPH PERCENT 29.9  10 - 50 %L   POC Granulocyte 3.2  2 - 6.9   Granulocyte percent 61.0  37 - 80 %G   RBC 4.2 (*) 4.69 - 6.13 M/uL   Hemoglobin 15.0  14.1 - 18.1 g/dL   HCT, POC 16.1 (*) 09.6 - 53.7 %   MCV 100.0 (*) 80 - 97 fL   MCH, POC 35.6 (*) 27 - 31.2 pg   MCHC 35.6 (*) 31.8 - 35.4 g/dL   RDW, POC 04.5     Platelet Count, POC 213.0  142 - 424 K/uL   MPV 6.7  0 - 99.8 fL   No orders of the defined types were placed in this encounter.   RTc in 4 weeks for skin biopsy.  Kaena Santori P. [place] Charon, M.D.

## 2013-02-23 LAB — NMR LIPOPROFILE WITH LIPIDS
Cholesterol, Total: 151 mg/dL (ref <200)
HDL Particle Number: 36.6 umol/L (ref >=30.5)
HDL Size: 9.5 nm (ref >=9.2)
HDL-C: 55 mg/dL (ref >=40)
LDL (calc): 57 mg/dL (ref <100)
LDL Particle Number: 914 nmol/L (ref <1000)
LDL Size: 20.5 nm — ABNORMAL LOW (ref >20.5)
LP-IR Score: 75 — ABNORMAL HIGH (ref <=45)
Large HDL-P: 9.3 umol/L (ref >=4.8)
Large VLDL-P: 12 nmol/L — ABNORMAL HIGH (ref <=2.7)
Small LDL Particle Number: 488 nmol/L (ref <=527)
Triglycerides: 193 mg/dL — ABNORMAL HIGH (ref <150)
VLDL Size: 72.3 nm — ABNORMAL HIGH (ref <=46.6)

## 2013-02-23 LAB — PSA: PSA: 2.16 ng/mL (ref <=4.00)

## 2013-02-25 NOTE — Progress Notes (Signed)
Quick Note:  Lab result at goal. Except for the triglycerides a little high. Should take fish oil 2000 mg twice daily. No change in Other Medications for now. The liver transaminase slightly elevated. Will recheck at next visit. Most likely secondary to meloxicam No Change in plans and follow up. ______

## 2013-03-04 HISTORY — PX: COLONOSCOPY: SHX174

## 2013-03-25 ENCOUNTER — Encounter: Payer: Self-pay | Admitting: Family Medicine

## 2013-03-25 ENCOUNTER — Ambulatory Visit (INDEPENDENT_AMBULATORY_CARE_PROVIDER_SITE_OTHER): Payer: Medicare Other | Admitting: Family Medicine

## 2013-03-25 VITALS — BP 112/68 | HR 99 | Temp 97.2°F | Ht 72.0 in | Wt 169.0 lb

## 2013-03-25 DIAGNOSIS — R7402 Elevation of levels of lactic acid dehydrogenase (LDH): Secondary | ICD-10-CM

## 2013-03-25 DIAGNOSIS — R748 Abnormal levels of other serum enzymes: Secondary | ICD-10-CM

## 2013-03-25 DIAGNOSIS — D485 Neoplasm of uncertain behavior of skin: Secondary | ICD-10-CM

## 2013-03-25 LAB — HEPATIC FUNCTION PANEL
ALT: 29 U/L (ref 0–53)
AST: 37 U/L (ref 0–37)
Albumin: 3.5 g/dL (ref 3.5–5.2)
Alkaline Phosphatase: 70 U/L (ref 39–117)
Bilirubin, Direct: 0.1 mg/dL (ref 0.0–0.3)
Indirect Bilirubin: 0.3 mg/dL (ref 0.0–0.9)
Total Bilirubin: 0.4 mg/dL (ref 0.3–1.2)
Total Protein: 5.9 g/dL — ABNORMAL LOW (ref 6.0–8.3)

## 2013-03-25 NOTE — Progress Notes (Signed)
Patient ID: Patrick Lee, male   DOB: [birth year], 52 y.o.   MRN: [id number] SUBJECTIVE: HPI: 1. here to review labs 2.here to remove mole on right leg  PMH/PSH: reviewed/updated in Epic  SH/FH: reviewed/updated in Epic  Allergies: reviewed/updated in Epic  Medications: reviewed/updated in Epic  Immunizations: reviewed/updated in Epic  ROS: As above in the HPI. All other systems are stable or negative.  OBJECTIVE: APPEARANCE:  Patient in no acute distress.The patient appeared well nourished and normally developed. Acyanotic. Waist: VITAL SIGNS:BP 112/68  Pulse 99  Temp(Src) 97.2 F (36.2 C) (Oral)  Ht 6' (1.829 m)  Wt 169 lb (76.658 kg)  BMI 22.92 kg/m2   SKIN: warm and  Dry without overt rashes, tattoos and scars  HEAD and Neck: without JVD, Head and scalp: normal Eyes:No scleral icterus. Fundi normal, eye movements normal. Ears: Auricle normal, canal normal, Tympanic membranes normal, insufflation normal. Nose: normal Throat: normal Neck & thyroid: normal  CHEST & LUNGS: Chest wall: normal Lungs: Clear  CVS: Reveals the PMI to be normally located. Regular rhythm, First and Second Heart sounds are normal,  absence of murmurs, rubs or gallops. Peripheral vasculature: Radial pulses: normal Dorsal pedis pulses: normal Posterior pulses: normal  ABDOMEN:  Appearance: normal Benign,, no organomegaly, no masses, no Abdominal Aortic enlargement. No Guarding , no rebound. No Bruits. Bowel sounds: normal  RECTAL: N/A GU: N/A  EXTREMETIES: nonedematous. Both Femoral and Pedal pulses are normal.  MUSCULOSKELETAL:  Spine: normal Joints: intact  NEUROLOGIC: oriented to time,place and person; nonfocal. Strength is normal Sensory is normal Reflexes are normal Cranial Nerves are normal.  Results for orders placed in visit on 03/25/13  HEPATIC FUNCTION PANEL      Result Value Range   Total Bilirubin 0.4  0.3 - 1.2 mg/dL   Bilirubin, Direct 0.1  0.0 - 0.3  mg/dL   Indirect Bilirubin 0.3  0.0 - 0.9 mg/dL   Alkaline Phosphatase 70  39 - 117 U/L   AST 37  0 - 37 U/L   ALT 29  0 - 53 U/L   Total Protein 5.9 (*) 6.0 - 8.3 g/dL   Albumin 3.5  3.5 - 5.2 g/dL    ASSESSMENT: Abnormal transaminases - Plan: Hepatic function panel  PLAN: Orders Placed This Encounter  Procedures  . Hepatic function panel   No orders of the defined types were placed in this encounter.   Results for orders placed in visit on 03/25/13  HEPATIC FUNCTION PANEL      Result Value Range   Total Bilirubin 0.4  0.3 - 1.2 mg/dL   Bilirubin, Direct 0.1  0.0 - 0.3 mg/dL   Indirect Bilirubin 0.3  0.0 - 0.9 mg/dL   Alkaline Phosphatase 70  39 - 117 U/L   AST 37  0 - 37 U/L   ALT 29  0 - 53 U/L   Total Protein 5.9 (*) 6.0 - 8.3 g/dL   Albumin 3.5  3.5 - 5.2 g/dL   PRocedure:Shave Biopsy Procedure Note  Pre-operative Diagnosis: 2 mm scaly lesion right leg  Post-operative Diagnosis: 2 mm scaly lesion right leg   Indications: R/O skin cancer  Anesthesia: 2% lidocaine. 1 cc used for local anesthesia    Procedure Details  History of allergy to iodine: no  Patient informed of the risks (including bleeding and infection) and benefits of the  procedure and  informed consent obtained.  The lesion and surrounding area were given a sterile prep using betadine and draped  in the usual sterile fashion. A scalpel was used to shave an area of skin approximately 3 cm by 3 cm.  Hemostasis achieved with pressure and monsels solution. Antibiotic ointment and a sterile dressing applied.  The specimen was sent for pathologic examination. The patient tolerated the procedure well.  Condition: Stable  Complications: .no  Plan: 1. Instructed to keep the wound dry and covered for 24-48h and clean thereafter. 2. Warning signs of infection were reviewed.   3. Recommended that the patient use tylenol prn. 4.keep clean and dry.  Labs reviewed with patient.  Adaja Wander P. [place] Charon,  M.D. .

## 2013-03-25 NOTE — Patient Instructions (Signed)
Hypertriglyceridemia  Diet for High blood levels of Triglycerides Most fats in food are triglycerides. Triglycerides in your blood are stored as fat in your body. High levels of triglycerides in your blood may put you at a greater risk for heart disease and stroke.  Normal triglyceride levels are less than 150 mg/dL. Borderline high levels are 150-199 mg/dl. High levels are 200 - 499 mg/dL, and very high triglyceride levels are greater than 500 mg/dL. The decision to treat high triglycerides is generally based on the level. For people with borderline or high triglyceride levels, treatment includes weight loss and exercise. Drugs are recommended for people with very high triglyceride levels. Many people who need treatment for high triglyceride levels have metabolic syndrome. This syndrome is a collection of disorders that often include: insulin resistance, high blood pressure, blood clotting problems, high cholesterol and triglycerides. TESTING PROCEDURE FOR TRIGLYCERIDES  You should not eat 4 hours before getting your triglycerides measured. The normal range of triglycerides is between 10 and 250 milligrams per deciliter (mg/dl). Some people may have extreme levels (1000 or above), but your triglyceride level may be too high if it is above 150 mg/dl, depending on what other risk factors you have for heart disease.  People with high blood triglycerides may also have high blood cholesterol levels. If you have high blood cholesterol as well as high blood triglycerides, your risk for heart disease is probably greater than if you only had high triglycerides. High blood cholesterol is one of the main risk factors for heart disease. CHANGING YOUR DIET  Your weight can affect your blood triglyceride level. If you are more than 20% above your ideal body weight, you may be able to lower your blood triglycerides by losing weight. Eating less and exercising regularly is the best way to combat this. Fat provides more  calories than any other food. The best way to lose weight is to eat less fat. Only 30% of your total calories should come from fat. Less than 7% of your diet should come from saturated fat. A diet low in fat and saturated fat is the same as a diet to decrease blood cholesterol. By eating a diet lower in fat, you may lose weight, lower your blood cholesterol, and lower your blood triglyceride level.  Eating a diet low in fat, especially saturated fat, may also help you lower your blood triglyceride level. Ask your dietitian to help you figure how much fat you can eat based on the number of calories your caregiver has prescribed for you.  Exercise, in addition to helping with weight loss may also help lower triglyceride levels.   Alcohol can increase blood triglycerides. You may need to stop drinking alcoholic beverages.  Too much carbohydrate in your diet may also increase your blood triglycerides. Some complex carbohydrates are necessary in your diet. These may include bread, rice, potatoes, other starchy vegetables and cereals.  Reduce "simple" carbohydrates. These may include pure sugars, candy, honey, and jelly without losing other nutrients. If you have the kind of high blood triglycerides that is affected by the amount of carbohydrates in your diet, you will need to eat less sugar and less high-sugar foods. Your caregiver can help you with this.  Adding 2-4 grams of fish oil (EPA+ DHA) may also help lower triglycerides. Speak with your caregiver before adding any supplements to your regimen. Following the Diet  Maintain your ideal weight. Your caregivers can help you with a diet. Generally, eating less food and getting more   exercise will help you lose weight. Joining a weight control group may also help. Ask your caregivers for a good weight control group in your area.  Eat low-fat foods instead of high-fat foods. This can help you lose weight too.  These foods are lower in fat. Eat MORE of these:    Dried beans, peas, and lentils.  Egg whites.  Low-fat cottage cheese.  Fish.  Lean cuts of meat, such as round, sirloin, rump, and flank (cut extra fat off meat you fix).  Whole grain breads, cereals and pasta.  Skim and nonfat dry milk.  Low-fat yogurt.  Poultry without the skin.  Cheese made with skim or part-skim milk, such as mozzarella, parmesan, farmers', ricotta, or pot cheese. These are higher fat foods. Eat LESS of these:   Whole milk and foods made from whole milk, such as American, blue, cheddar, monterey jack, and swiss cheese  High-fat meats, such as luncheon meats, sausages, knockwurst, bratwurst, hot dogs, ribs, corned beef, ground pork, and regular ground beef.  Fried foods. Limit saturated fats in your diet. Substituting unsaturated fat for saturated fat may decrease your blood triglyceride level. You will need to read package labels to know which products contain saturated fats.  These foods are high in saturated fat. Eat LESS of these:   Fried pork skins.  Whole milk.  Skin and fat from poultry.  Palm oil.  Butter.  Shortening.  Cream cheese.  Bacon.  Margarines and baked goods made from listed oils.  Vegetable shortenings.  Chitterlings.  Fat from meats.  Coconut oil.  Palm kernel oil.  Lard.  Cream.  Sour cream.  Fatback.  Coffee whiteners and non-dairy creamers made with these oils.  Cheese made from whole milk. Use unsaturated fats (both polyunsaturated and monounsaturated) moderately. Remember, even though unsaturated fats are better than saturated fats; you still want a diet low in total fat.  These foods are high in unsaturated fat:   Canola oil.  Sunflower oil.  Mayonnaise.  Almonds.  Peanuts.  Pine nuts.  Margarines made with these oils.  Safflower oil.  Olive oil.  Avocados.  Cashews.  Peanut butter.  Sunflower seeds.  Soybean oil.  Peanut  oil.  Olives.  Pecans.  Walnuts.  Pumpkin seeds. Avoid sugar and other high-sugar foods. This will decrease carbohydrates without decreasing other nutrients. Sugar in your food goes rapidly to your blood. When there is excess sugar in your blood, your liver may use it to make more triglycerides. Sugar also contains calories without other important nutrients.  Eat LESS of these:   Sugar, brown sugar, powdered sugar, jam, jelly, preserves, honey, syrup, molasses, pies, candy, cakes, cookies, frosting, pastries, colas, soft drinks, punches, fruit drinks, and regular gelatin.  Avoid alcohol. Alcohol, even more than sugar, may increase blood triglycerides. In addition, alcohol is high in calories and low in nutrients. Ask for sparkling water, or a diet soft drink instead of an alcoholic beverage. Suggestions for planning and preparing meals   Bake, broil, grill or roast meats instead of frying.  Remove fat from meats and skin from poultry before cooking.  Add spices, herbs, lemon juice or vinegar to vegetables instead of salt, rich sauces or gravies.  Use a non-stick skillet without fat or use no-stick sprays.  Cool and refrigerate stews and broth. Then remove the hardened fat floating on the surface before serving.  Refrigerate meat drippings and skim off fat to make low-fat gravies.  Serve more fish.  Use less butter,   margarine and other high-fat spreads on bread or vegetables.  Use skim or reconstituted non-fat dry milk for cooking.  Cook with low-fat cheeses.  Substitute low-fat yogurt or cottage cheese for all or part of the sour cream in recipes for sauces, dips or congealed salads.  Use half yogurt/half mayonnaise in salad recipes.  Substitute evaporated skim milk for cream. Evaporated skim milk or reconstituted non-fat dry milk can be whipped and substituted for whipped cream in certain recipes.  Choose fresh fruits for dessert instead of high-fat foods such as pies or  cakes. Fruits are naturally low in fat. When Dining Out   Order low-fat appetizers such as fruit or vegetable juice, pasta with vegetables or tomato sauce.  Select clear, rather than cream soups.  Ask that dressings and gravies be served on the side. Then use less of them.  Order foods that are baked, broiled, poached, steamed, stir-fried, or roasted.  Ask for margarine instead of butter, and use only a small amount.  Drink sparkling water, unsweetened tea or coffee, or diet soft drinks instead of alcohol or other sweet beverages. QUESTIONS AND ANSWERS ABOUT OTHER FATS IN THE BLOOD: SATURATED FAT, TRANS FAT, AND CHOLESTEROL What is trans fat? Trans fat is a type of fat that is formed when vegetable oil is hardened through a process called hydrogenation. This process helps makes foods more solid, gives them shape, and prolongs their shelf life. Trans fats are also called hydrogenated or partially hydrogenated oils.  What do saturated fat, trans fat, and cholesterol in foods have to do with heart disease? Saturated fat, trans fat, and cholesterol in the diet all raise the level of LDL "bad" cholesterol in the blood. The higher the LDL cholesterol, the greater the risk for coronary heart disease (CHD). Saturated fat and trans fat raise LDL similarly.  What foods contain saturated fat, trans fat, and cholesterol? High amounts of saturated fat are found in animal products, such as fatty cuts of meat, chicken skin, and full-fat dairy products like butter, whole milk, cream, and cheese, and in tropical vegetable oils such as palm, palm kernel, and coconut oil. Trans fat is found in some of the same foods as saturated fat, such as vegetable shortening, some margarines (especially hard or stick margarine), crackers, cookies, baked goods, fried foods, salad dressings, and other processed foods made with partially hydrogenated vegetable oils. Small amounts of trans fat also occur naturally in some animal  products, such as milk products, beef, and lamb. Foods high in cholesterol include liver, other organ meats, egg yolks, shrimp, and full-fat dairy products. How can I use the new food label to make heart-healthy food choices? Check the Nutrition Facts panel of the food label. Choose foods lower in saturated fat, trans fat, and cholesterol. For saturated fat and cholesterol, you can also use the Percent Daily Value (%DV): 5% DV or less is low, and 20% DV or more is high. (There is no %DV for trans fat.) Use the Nutrition Facts panel to choose foods low in saturated fat and cholesterol, and if the trans fat is not listed, read the ingredients and limit products that list shortening or hydrogenated or partially hydrogenated vegetable oil, which tend to be high in trans fat. POINTS TO REMEMBER:   Discuss your risk for heart disease with your caregivers, and take steps to reduce risk factors.  Change your diet. Choose foods that are low in saturated fat, trans fat, and cholesterol.  Add exercise to your daily routine if   it is not already being done. Participate in physical activity of moderate intensity, like brisk walking, for at least 30 minutes on most, and preferably all days of the week. No time? Break the 30 minutes into three, 10-minute segments during the day.  Stop smoking. If you do smoke, contact your caregiver to discuss ways in which they can help you quit.  Do not use street drugs.  Maintain a normal weight.  Maintain a healthy blood pressure.  Keep up with your blood work for checking the fats in your blood as directed by your caregiver. Document Released: 08/08/2004 Document Revised: 04/21/2012 Document Reviewed: 03/06/2009 [employer] Patient Information 2014 [employer].  

## 2013-03-27 NOTE — Progress Notes (Signed)
Quick Note:  Call patient. Labs normal. No change in plan. ______ 

## 2013-04-19 ENCOUNTER — Ambulatory Visit (INDEPENDENT_AMBULATORY_CARE_PROVIDER_SITE_OTHER): Payer: Medicare Other | Admitting: Family Medicine

## 2013-04-19 ENCOUNTER — Encounter: Payer: Self-pay | Admitting: Family Medicine

## 2013-04-19 VITALS — BP 97/65 | HR 75 | Temp 97.6°F | Wt 166.2 lb

## 2013-04-19 DIAGNOSIS — Z8509 Personal history of malignant neoplasm of other digestive organs: Secondary | ICD-10-CM

## 2013-04-19 DIAGNOSIS — Z87891 Personal history of nicotine dependence: Secondary | ICD-10-CM | POA: Insufficient documentation

## 2013-04-19 DIAGNOSIS — C3430 Malignant neoplasm of lower lobe, unspecified bronchus or lung: Secondary | ICD-10-CM

## 2013-04-19 DIAGNOSIS — R748 Abnormal levels of other serum enzymes: Secondary | ICD-10-CM | POA: Insufficient documentation

## 2013-04-19 DIAGNOSIS — Z8507 Personal history of malignant neoplasm of pancreas: Secondary | ICD-10-CM

## 2013-04-19 DIAGNOSIS — R7402 Elevation of levels of lactic acid dehydrogenase (LDH): Secondary | ICD-10-CM

## 2013-04-19 NOTE — Progress Notes (Signed)
Patient ID: Patrick Lee, male   DOB: [birth year], 53 y.o.   MRN: [id number] SUBJECTIVE: CC: Chief Complaint  Patient presents with  . Follow-up    folow up medical problems    HPI:  healthy doing well, exercising, not smoking, doing well. Has follow up with his hematologist. So far doing great.  PMH/PSH: reviewed/updated in Epic  SH/FH: reviewed/updated in Epic  Allergies: reviewed/updated in Epic  Medications: reviewed/updated in Epic  Immunizations: reviewed/updated in Epic  ROS: As above in the HPI. All other systems are stable or negative.  OBJECTIVE: APPEARANCE:  Patient in no acute distress.The patient appeared well nourished and normally developed. Acyanotic. Waist: VITAL SIGNS:BP 97/65  Pulse 75  Temp(Src) 97.6 F (36.4 C) (Oral)  Wt 166 lb 3.2 oz (75.388 kg)  BMI 22.54 kg/m2 WM  SKIN: warm and  Dry without overt rashes, tattoos and scars  HEAD and Neck: without JVD, Head and scalp: normal Eyes:No scleral icterus. Fundi normal, eye movements normal. Ears: Auricle normal, canal normal, Tympanic membranes normal, insufflation normal. Nose: normal Throat: normal Neck & thyroid: normal  CHEST & LUNGS: Chest wall: normal Lungs: Clear  CVS: Reveals the PMI to be normally located. Regular rhythm, First and Second Heart sounds are normal,  absence of murmurs, rubs or gallops. Peripheral vasculature: Radial pulses: normal Dorsal pedis pulses: normal Posterior pulses: normal  ABDOMEN:  Appearance: normal Benign, no organomegaly, no masses, no Abdominal Aortic enlargement. No Guarding , no rebound. No Bruits. Bowel sounds: normal  RECTAL: N/A GU: N/A  EXTREMETIES: nonedematous. Both Femoral and Pedal pulses are normal.  MUSCULOSKELETAL:  Spine: normal Joints: intact  NEUROLOGIC: oriented to time,place and person; nonfocal. Strength is normal Sensory is normal Reflexes are normal Cranial Nerves are normal.  ASSESSMENT: Abnormal  transaminases - batter  Malignant neoplasm of lower lobe, bronchus, or lung, unspecified laterality - doing well  Former smoker  H/O pancreatic cancer   PLAN:      Dr Woodroe Mode Recommendations  Diet and Exercise discussed with patient.  For nutrition information, I recommend books:  1).Eat to Live by Dr Monico Hoar. 2).Prevent and Reverse Heart Disease by Dr Suzzette Righter. 3) Dr Katherina Right Book:  Program to Reverse Diabetes  Exercise recommendations are:  If unable to walk, then the patient can exercise in a chair 3 times a day. By flapping arms like a bird gently and raising legs outwards to the front.  If ambulatory, the patient can go for walks for 30 minutes 3 times a week. Then increase the intensity and duration as tolerated.  Goal is to try to attain exercise frequency to 5 times a week.  If applicable: Best to perform resistance exercises (machines or weights) 2 days a week and cardio type exercises 3 days per week.  Healthy life style.  Praised patient on his  Successes so far. No ETOH, no tobacco.  Return in about 4 months (around 08/19/2013) for Recheck medical problems.  Carlin Attridge P. Modesto Charon, M.D.

## 2013-04-19 NOTE — Patient Instructions (Signed)
      Dr Rae Plotner's Recommendations  Diet and Exercise discussed with patient.  For nutrition information, I recommend books:  1).Eat to Live by Dr Joel Fuhrman. 2).Prevent and Reverse Heart Disease by Dr Caldwell Esselstyn. 3) Dr Neal Barnard's Book:  Program to Reverse Diabetes  Exercise recommendations are:  If unable to walk, then the patient can exercise in a chair 3 times a day. By flapping arms like a bird gently and raising legs outwards to the front.  If ambulatory, the patient can go for walks for 30 minutes 3 times a week. Then increase the intensity and duration as tolerated.  Goal is to try to attain exercise frequency to 5 times a week.  If applicable: Best to perform resistance exercises (machines or weights) 2 days a week and cardio type exercises 3 days per week.  

## 2013-04-22 ENCOUNTER — Other Ambulatory Visit: Payer: Self-pay | Admitting: *Deleted

## 2013-04-27 ENCOUNTER — Ambulatory Visit: Payer: Medicare Other | Admitting: Surgery

## 2013-04-28 ENCOUNTER — Ambulatory Visit: Payer: Medicare Other | Admitting: Surgery

## 2013-06-16 ENCOUNTER — Ambulatory Visit
Admission: RE | Admit: 2013-06-16 | Discharge: 2013-06-16 | Disposition: A | Discharge status: Home / Self Care | Payer: Medicare Other | Source: Ambulatory Visit | Attending: Cardiothoracic Surgery | Admitting: Cardiothoracic Surgery

## 2013-06-16 ENCOUNTER — Ambulatory Visit: Payer: Medicare Other | Admitting: Surgery

## 2013-06-21 ENCOUNTER — Other Ambulatory Visit: Payer: Self-pay | Admitting: *Deleted

## 2013-06-23 ENCOUNTER — Encounter: Payer: Self-pay | Admitting: Surgery

## 2013-06-23 ENCOUNTER — Ambulatory Visit (INDEPENDENT_AMBULATORY_CARE_PROVIDER_SITE_OTHER): Payer: Medicare Other | Admitting: Surgery

## 2013-06-23 DIAGNOSIS — Z09 Encounter for follow-up examination after completed treatment for conditions other than malignant neoplasm: Secondary | ICD-10-CM

## 2013-06-23 DIAGNOSIS — C3430 Malignant neoplasm of lower lobe, unspecified bronchus or lung: Secondary | ICD-10-CM

## 2013-06-23 NOTE — Progress Notes (Signed)
      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 He has a history of Whipple procedure about 6 1/2 years ago for Stage IIB T3 N1 M0 pancreatic cancer at [facility] by Dr. Deveron Furlong. Since I last saw him on 01/12/2013 he has been doing well. He denies any cough or sputum production. He has had no hemoptysis. He is eating well and gaining weight. He denies any headaches or visual changes. He has had no aches or pains. He recently underwent CT scan of the chest abdomen and pelvis at [facility] on 06/15/2013. The chest CT showed some linear scarring in the right lung related to his prior surgery. There were no suspicious pulmonary lesions. There is a small amount of loculated fluid along the medial aspect of the right lung base that had decreased from the prior exam. 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 There was no evidence of recurrent pancreatic cancer.   No current outpatient prescriptions on file.   No current facility-administered medications for this visit.     Physical Exam: BP 109/77  Pulse 68  Resp 20  Ht 6' (1.829 m)  Wt 166 lb (75.297 kg)  BMI 22.51 kg/m2  SpO2 97% He looks well. There is no cervical or supraclavicular adenopathy. Lung exam is clear. The right thoracotomy scar looks fine. There are no skin lesions. Cardiac exam shows a regular rate and rhythm with normal heart sounds. Abdominal exam shows active bowel sounds. There are no palpable masses or organomegaly.  Diagnostic Tests:  *RADIOLOGY REPORT*  Clinical Data: Post  wedge resection and superior segment of the  right lower lobe for cavitary lesion consistent with neoplasm  CHEST - 2 VIEW  Comparison: Chest x-ray of 01/12/2013  Findings: No active infiltrate or effusion is seen. Postoperative  change is stable at the right lung base. Mediastinal contours are  stable. The heart is within normal limits in size. No bony  abnormality is seen.  IMPRESSION:  Stable postoperative change of the right lung base. No active lung  disease  Original Report Authenticated By: Dwyane Dee, M.D.    Impression:  He is a little over one year following right lower lobectomy for stage IA adenocarcinoma of the lung. There is no evidence of recurrence on recent CT scan. I recommended that we continue surveillance with a chest x-ray in 6 months. He said that he is planning on following up with Dr. Flonnie Hailstone at [facility] yearly and will have a CT scan done at that time.  Plan:  He will return to see me in 6 months with a chest x-ray.

## 2013-08-19 ENCOUNTER — Ambulatory Visit: Payer: Medicare Other | Admitting: Family Medicine

## 2013-12-23 ENCOUNTER — Other Ambulatory Visit: Payer: Self-pay | Admitting: *Deleted

## 2013-12-23 DIAGNOSIS — C3430 Malignant neoplasm of lower lobe, unspecified bronchus or lung: Secondary | ICD-10-CM

## 2013-12-29 ENCOUNTER — Ambulatory Visit: Payer: Medicare Other | Admitting: Surgery

## 2014-01-05 ENCOUNTER — Ambulatory Visit: Payer: Medicare Other | Admitting: Surgery

## 2014-08-01 ENCOUNTER — Emergency Department (HOSPITAL_COMMUNITY): Payer: Medicare Other

## 2014-08-01 ENCOUNTER — Emergency Department (HOSPITAL_COMMUNITY)
Admission: EM | Admit: 2014-08-01 | Discharge: 2014-08-02 | Disposition: A | Discharge status: Home / Self Care | Payer: Medicare Other | Attending: Emergency Medicine | Admitting: Emergency Medicine

## 2014-08-01 ENCOUNTER — Encounter (HOSPITAL_COMMUNITY): Payer: Self-pay | Admitting: Emergency Medicine

## 2014-08-01 DIAGNOSIS — R1013 Epigastric pain: Secondary | ICD-10-CM | POA: Diagnosis present

## 2014-08-01 DIAGNOSIS — Z8719 Personal history of other diseases of the digestive system: Secondary | ICD-10-CM | POA: Insufficient documentation

## 2014-08-01 DIAGNOSIS — R112 Nausea with vomiting, unspecified: Secondary | ICD-10-CM | POA: Insufficient documentation

## 2014-08-01 DIAGNOSIS — Z8739 Personal history of other diseases of the musculoskeletal system and connective tissue: Secondary | ICD-10-CM | POA: Diagnosis not present

## 2014-08-01 DIAGNOSIS — R222 Localized swelling, mass and lump, trunk: Secondary | ICD-10-CM | POA: Diagnosis not present

## 2014-08-01 DIAGNOSIS — I252 Old myocardial infarction: Secondary | ICD-10-CM | POA: Diagnosis not present

## 2014-08-01 DIAGNOSIS — Z8659 Personal history of other mental and behavioral disorders: Secondary | ICD-10-CM | POA: Diagnosis not present

## 2014-08-01 DIAGNOSIS — Z9089 Acquired absence of other organs: Secondary | ICD-10-CM | POA: Insufficient documentation

## 2014-08-01 DIAGNOSIS — R918 Other nonspecific abnormal finding of lung field: Secondary | ICD-10-CM

## 2014-08-01 DIAGNOSIS — Z85118 Personal history of other malignant neoplasm of bronchus and lung: Secondary | ICD-10-CM | POA: Insufficient documentation

## 2014-08-01 DIAGNOSIS — Z8509 Personal history of malignant neoplasm of other digestive organs: Secondary | ICD-10-CM | POA: Insufficient documentation

## 2014-08-01 DIAGNOSIS — Z7982 Long term (current) use of aspirin: Secondary | ICD-10-CM | POA: Diagnosis not present

## 2014-08-01 LAB — COMPREHENSIVE METABOLIC PANEL
ALT: 57 U/L — AB (ref 0–53)
AST: 139 U/L — ABNORMAL HIGH (ref 0–37)
Albumin: 3.2 g/dL — ABNORMAL LOW (ref 3.5–5.2)
Alkaline Phosphatase: 109 U/L (ref 39–117)
Anion gap: 15 (ref 5–15)
BUN: 5 mg/dL — ABNORMAL LOW (ref 6–23)
CHLORIDE: 103 meq/L (ref 96–112)
CO2: 23 mEq/L (ref 19–32)
Calcium: 8.7 mg/dL (ref 8.4–10.5)
Creatinine, Ser: 0.8 mg/dL (ref 0.50–1.35)
GFR calc Af Amer: >90 mL/min (ref >90)
GFR calc non Af Amer: >90 mL/min (ref >90)
GLUCOSE: 99 mg/dL (ref 70–99)
Potassium: 4.1 mEq/L (ref 3.7–5.3)
SODIUM: 141 meq/L (ref 137–147)
TOTAL PROTEIN: 6.6 g/dL (ref 6.0–8.3)
Total Bilirubin: 0.8 mg/dL (ref 0.3–1.2)

## 2014-08-01 LAB — CBC WITH DIFFERENTIAL/PLATELET
Basophils Absolute: 0.1 10*3/uL (ref 0.0–0.1)
Basophils Relative: 1 % (ref 0–1)
EOS ABS: 0 10*3/uL (ref 0.0–0.7)
EOS PCT: 0 % (ref 0–5)
HCT: 41 % (ref 39.0–52.0)
Hemoglobin: 14.1 g/dL (ref 13.0–17.0)
Lymphocytes Relative: 20 % (ref 12–46)
Lymphs Abs: 1.4 10*3/uL (ref 0.7–4.0)
MCH: 34.7 pg — AB (ref 26.0–34.0)
MCHC: 34.4 g/dL (ref 30.0–36.0)
MCV: 101 fL — ABNORMAL HIGH (ref 78.0–100.0)
MONOS PCT: 7 % (ref 3–12)
Monocytes Absolute: 0.5 10*3/uL (ref 0.1–1.0)
Neutro Abs: 4.9 10*3/uL (ref 1.7–7.7)
Neutrophils Relative %: 72 % (ref 43–77)
PLATELETS: 221 10*3/uL (ref 150–400)
RBC: 4.06 MIL/uL — AB (ref 4.22–5.81)
RDW: 12.3 % (ref 11.5–15.5)
WBC: 6.8 10*3/uL (ref 4.0–10.5)

## 2014-08-01 LAB — LIPASE, BLOOD: Lipase: 6 U/L — ABNORMAL LOW (ref 11–59)

## 2014-08-01 MED ORDER — IOHEXOL 300 MG/ML  SOLN
100.0000 mL | Freq: Once | INTRAMUSCULAR | Status: AC | PRN
  Administered 2014-08-01: 100 mL via INTRAVENOUS

## 2014-08-01 MED ORDER — HYDROMORPHONE HCL 1 MG/ML IJ SOLN
1.0000 mg | Freq: Once | INTRAMUSCULAR | Status: AC
  Administered 2014-08-02: 1 mg via INTRAVENOUS
  Filled 2014-08-01: qty 1

## 2014-08-01 MED ORDER — ONDANSETRON HCL 4 MG/2ML IJ SOLN
4.0000 mg | Freq: Once | INTRAMUSCULAR | Status: AC
  Administered 2014-08-01: 4 mg via INTRAVENOUS
  Filled 2014-08-01: qty 2

## 2014-08-01 MED ORDER — HYDROMORPHONE HCL 1 MG/ML IJ SOLN
1.0000 mg | Freq: Once | INTRAMUSCULAR | Status: AC
  Administered 2014-08-01: 1 mg via INTRAVENOUS
  Filled 2014-08-01: qty 1

## 2014-08-01 MED ORDER — SODIUM CHLORIDE 0.9 % IV BOLUS (SEPSIS)
250.0000 mL | Freq: Once | INTRAVENOUS | Status: AC
  Administered 2014-08-01: 250 mL via INTRAVENOUS

## 2014-08-01 MED ORDER — IOHEXOL 300 MG/ML  SOLN
50.0000 mL | Freq: Once | INTRAMUSCULAR | Status: AC | PRN
  Administered 2014-08-01: 50 mL via ORAL

## 2014-08-01 MED ORDER — SODIUM CHLORIDE 0.9 % IV SOLN: INTRAVENOUS | Status: DC

## 2014-08-01 NOTE — ED Provider Notes (Signed)
Care assumed from Dr. Rogene Houston at shift change. Patient with abdominal pain times one month with nausea and vomiting. Remote history of Whipple procedure. CT scan pending.  CT shows thickening of the affarent limb as well as nodular opacity in the right lung base suspicious for neoplasm.  Message sent to Dr. Cyndia Bent who patient has seen previously for his lung issues.  Case discussed with Dr. Vivia Ewing of the [facility] surgery. CT results reviewed. Mild transaminitis seems to be related to recent alcohol intake. Patient has had previous cholecystectomy. His pain is well-controlled. Dr. Hassell Done feels patient can go home for outpatient followup. The patient and parents in agreement. He is tolerating PO and pain is controlled.  BP 111/88  Pulse 83  Temp(Src) 98.2 F (36.8 C) (Oral)  Resp 17  Ht 6' (1.829 m)  Wt 142 lb (64.411 kg)  BMI 19.25 kg/m2  SpO2 100%     Ezequiel Essex, MD 08/02/14 [phone]

## 2014-08-01 NOTE — ED Provider Notes (Signed)
CSN: [id number]     Arrival date & time 08/01/14  1752 History   First MD Initiated Contact with Patient 08/01/14 2032     No chief complaint on file.    (Consider location/radiation/quality/duration/timing/severity/associated sxs/prior Treatment) The history is provided by the patient.   53 year old male status post Whipple procedure 2008 for pancreatic cancer. Followed by [facility] for this. Patient developed a abdominal pain predominantly epigastric abdominal pain about a month ago. Was seen at [facility] that CT scan of his abdomen done without any significant findings. But the pain has persisted. Associated with nausea and vomiting. Patient also a 25 pound weight loss. Patient states the pain got worse this evening. Patient states pain is currently 8/10. Vomiting of some bile no blood. Denies any fevers.  Past Medical History  Diagnosis Date  . Lateral epicondylitis   . Chest pain, unspecified   . Abdominal pain, other specified site   . Obstruction of bile duct   . Tobacco use disorder   . Pancreatic cancer      status post Whipple procedure.  . Anxiety   . Shortness of breath     uses oxygen at night- 2 liters   . Myocardial infarction 2007    during [place]- 2007  . Complication of anesthesia     pt. reports that he had MI while under anesth. for Whipple procedure  . Lung cancer    Past Surgical History  Procedure Laterality Date  . Whipple procedure  2007  . Percutaneous extraction of gallstones      [facility]  . Knee arthroscopy      [facility]  . Cholecystectomy      open  . Video bronchoscopy  04/08/2012    Procedure: VIDEO BRONCHOSCOPY;  Surgeon: Gaye Pollack, MD;  Location: [facility] OR;  Service: Thoracic;  Laterality: N/A;   Family History  Problem Relation Age of Onset  . Stroke Father 59    Deceased  . Anesthesia problems Neg Hx    History  Substance Use Topics  . Smoking status: Former Smoker -- 1.00 packs/day for 28  years    Types: Cigarettes    Quit date: 01/02/2012  . Smokeless tobacco: Never Used  . Alcohol Use: Yes     Comment:  Rare alcohol use    Review of Systems  Constitutional: Positive for unexpected weight change. Negative for fever.  HENT: Negative for congestion.   Eyes: Negative for redness.  Respiratory: Negative for shortness of breath.   Cardiovascular: Negative for chest pain.  Gastrointestinal: Positive for nausea, vomiting and abdominal pain.  Genitourinary: Negative for dysuria.  Musculoskeletal: Negative for back pain.  Skin: Negative for rash.  Neurological: Negative for headaches.  Hematological: Does not bruise/bleed easily.  Psychiatric/Behavioral: Negative for confusion.      Allergies  Review of patient's allergies indicates no known allergies.  Home Medications   Prior to Admission medications   Medication Sig Start Date End Date Taking? Authorizing Provider  aspirin EC 81 MG tablet Take 81 mg by mouth daily.   Yes Historical Provider, MD  ibuprofen (ADVIL,MOTRIN) 200 MG tablet Take 200 mg by mouth daily as needed for mild pain or moderate pain.   Yes Historical Provider, MD  Pancrelipase, Lip-Prot-Amyl, (CREON) 24000 UNITS CPEP Take 24,000 units of lipase by mouth 5 (five) times daily. Take three with meals and one with snacks 15 minutes prior to eating 06/21/14  Yes Historical Provider, MD  BP 111/88  Pulse 83  Temp(Src) 98.2 F (36.8 C) (Oral)  Resp 17  Ht 6' (1.829 m)  Wt 142 lb (64.411 kg)  BMI 19.25 kg/m2  SpO2 100% Physical Exam  Nursing note and vitals reviewed. Constitutional: He is oriented to person, place, and time. He appears well-developed and well-nourished. No distress.  HENT:  Head: Normocephalic and atraumatic.  Mouth/Throat: Oropharynx is clear and moist.  Eyes: Conjunctivae and EOM are normal. Pupils are equal, round, and reactive to light.  Neck: Normal range of motion.  Cardiovascular: Normal rate, regular rhythm and normal  heart sounds.   No murmur heard. Pulmonary/Chest: Effort normal and breath sounds normal. No respiratory distress.  Abdominal: Soft. Bowel sounds are normal. He exhibits no distension. There is no tenderness.  Musculoskeletal: Normal range of motion.  Neurological: He is alert and oriented to person, place, and time. No cranial nerve deficit. He exhibits normal muscle tone. Coordination normal.  Skin: Skin is warm. No rash noted.    ED Course  Procedures (including critical care time) Labs Review Labs Reviewed  COMPREHENSIVE METABOLIC PANEL - Abnormal; Notable for the following:    BUN 5 (*)    Albumin 3.2 (*)    AST 139 (*)    ALT 57 (*)    All other components within normal limits  LIPASE, BLOOD - Abnormal; Notable for the following:    Lipase 6 (*)    All other components within normal limits  CBC WITH DIFFERENTIAL - Abnormal; Notable for the following:    RBC 4.06 (*)    MCV 101.0 (*)    MCH 34.7 (*)    All other components within normal limits   Results for orders placed during the hospital encounter of 08/01/14  COMPREHENSIVE METABOLIC PANEL      Result Value Ref Range   Sodium 141  137 - 147 mEq/L   Potassium 4.1  3.7 - 5.3 mEq/L   Chloride 103  96 - 112 mEq/L   CO2 23  19 - 32 mEq/L   Glucose, Bld 99  70 - 99 mg/dL   BUN 5 (*) 6 - 23 mg/dL   Creatinine, Ser 0.80  0.50 - 1.35 mg/dL   Calcium 8.7  8.4 - 10.5 mg/dL   Total Protein 6.6  6.0 - 8.3 g/dL   Albumin 3.2 (*) 3.5 - 5.2 g/dL   AST 139 (*) 0 - 37 U/L   ALT 57 (*) 0 - 53 U/L   Alkaline Phosphatase 109  39 - 117 U/L   Total Bilirubin 0.8  0.3 - 1.2 mg/dL   GFR calc non Af Amer >90  >90 mL/min   GFR calc Af Amer >90  >90 mL/min   Anion gap 15  5 - 15  LIPASE, BLOOD      Result Value Ref Range   Lipase 6 (*) 11 - 59 U/L  CBC WITH DIFFERENTIAL      Result Value Ref Range   WBC 6.8  4.0 - 10.5 K/uL   RBC 4.06 (*) 4.22 - 5.81 MIL/uL   Hemoglobin 14.1  13.0 - 17.0 g/dL   HCT 41.0  39.0 - 52.0 %   MCV 101.0  (*) 78.0 - 100.0 fL   MCH 34.7 (*) 26.0 - 34.0 pg   MCHC 34.4  30.0 - 36.0 g/dL   RDW 12.3  11.5 - 15.5 %   Platelets 221  150 - 400 K/uL   Neutrophils Relative % 72  43 -  77 %   Neutro Abs 4.9  1.7 - 7.7 K/uL   Lymphocytes Relative 20  12 - 46 %   Lymphs Abs 1.4  0.7 - 4.0 K/uL   Monocytes Relative 7  3 - 12 %   Monocytes Absolute 0.5  0.1 - 1.0 K/uL   Eosinophils Relative 0  0 - 5 %   Eosinophils Absolute 0.0  0.0 - 0.7 K/uL   Basophils Relative 1  0 - 1 %   Basophils Absolute 0.1  0.0 - 0.1 K/uL     Imaging Review Dg Chest 2 View  08/01/2014   CLINICAL DATA:  epigastric abdominal pain  EXAM: CHEST  2 VIEW  COMPARISON:  Prior study from 06/16/2013  FINDINGS: The cardiac and mediastinal silhouettes are stable in size and contour, and remain within normal limits.  The lungs are normally inflated. No airspace consolidation, pleural effusion, or pulmonary edema is identified. There is no pneumothorax.  No acute osseous abnormality identified.  IMPRESSION: No active cardiopulmonary disease.   Electronically Signed   By: Jeannine Boga M.D.   On: 08/01/2014 22:05     EKG Interpretation None      MDM   Final diagnoses:  Epigastric pain    Patient status post Whipple procedure at [facility] in 2008. He still followed by them. This was done for pancreatic cancer. Patient was seen by them a month ago for this abdominal pain. His CT scan of his abdomen without any significant [place] is. Patient states is something doesn't seem to be right still has the pain and is losing weight. Patient lost 25 pounds.  CT scan of the abdomen was ordered if negative patient can follow back up to [facility]. May require pain medication and antinausea medicine.  Patient turned over to the evening ED physician.    Fredia Sorrow, MD 08/01/14 2224

## 2014-08-01 NOTE — ED Notes (Signed)
Complaining of abdominal pain, states having intermittent abdominal pain x 1 month, worse tonight. States vomiting bile up today. States he has lost 25 lbs in last month.

## 2014-08-02 MED ORDER — ONDANSETRON HCL 4 MG PO TABS: 4.0000 mg | ORAL_TABLET | Freq: Four times a day (QID) | ORAL | Status: DC

## 2014-08-02 MED ORDER — OXYCODONE-ACETAMINOPHEN 5-325 MG PO TABS: 2.0000 | ORAL_TABLET | ORAL | Status: DC | PRN

## 2014-08-02 NOTE — Discharge Instructions (Signed)
Abdominal Pain Follow up with your doctor. Call Dr. Cyndia Bent regarding your possible lung mass. Return to the ED if you develop new or worsening symptoms. Many things can cause abdominal pain. Usually, abdominal pain is not caused by a disease and will improve without treatment. It can often be observed and treated at home. Your health care provider will do a physical exam and possibly order blood tests and X-rays to help determine the seriousness of your pain. However, in many cases, more time must pass before a clear cause of the pain can be found. Before that point, your health care provider may not know if you need more testing or further treatment. HOME CARE INSTRUCTIONS  Monitor your abdominal pain for any changes. The following actions may help to alleviate any discomfort you are experiencing:  Only take over-the-counter or prescription medicines as directed by your health care provider.  Do not take laxatives unless directed to do so by your health care provider.  Try a clear liquid diet (broth, tea, or water) as directed by your health care provider. Slowly move to a bland diet as tolerated. SEEK MEDICAL CARE IF:  You have unexplained abdominal pain.  You have abdominal pain associated with nausea or diarrhea.  You have pain when you urinate or have a bowel movement.  You experience abdominal pain that wakes you in the night.  You have abdominal pain that is worsened or improved by eating food.  You have abdominal pain that is worsened with eating fatty foods.  You have a fever. SEEK IMMEDIATE MEDICAL CARE IF:   Your pain does not go away within 2 hours.  You keep throwing up (vomiting).  Your pain is felt only in portions of the abdomen, such as the right side or the left lower portion of the abdomen.  You pass bloody or black tarry stools. MAKE SURE YOU:  Understand these instructions.   Will watch your condition.   Will get help right away if you are not doing  well or get worse.  Document Released: 07/31/2005 Document Revised: 10/26/2013 Document Reviewed: 06/30/2013 [facility] Patient Information 2015 [place], [place]. This information is not intended to replace advice given to you by your health care provider. Make sure you discuss any questions you have with your health care provider.

## 2014-08-17 ENCOUNTER — Ambulatory Visit (INDEPENDENT_AMBULATORY_CARE_PROVIDER_SITE_OTHER): Payer: Medicare Other | Admitting: Surgery

## 2014-08-17 ENCOUNTER — Encounter: Payer: Self-pay | Admitting: Surgery

## 2014-08-17 VITALS — BP 110/76 | HR 109 | Resp 16 | Ht 72.0 in | Wt 142.0 lb

## 2014-08-17 DIAGNOSIS — R911 Solitary pulmonary nodule: Secondary | ICD-10-CM

## 2014-08-17 DIAGNOSIS — J984 Other disorders of lung: Secondary | ICD-10-CM

## 2014-08-17 DIAGNOSIS — Z85118 Personal history of other malignant neoplasm of bronchus and lung: Secondary | ICD-10-CM

## 2014-08-17 NOTE — Progress Notes (Signed)
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 He has a history of Whipple procedure about 6 1/2 years ago for Stage IIB T3 N1 M0 pancreatic cancer at [facility] by Dr. Jyl Heinz. He  underwent CT scan of the chest abdomen and pelvis at [facility] on 06/15/2013. The chest CT showed some linear scarring in the right lung related to his prior surgery. There were no suspicious pulmonary lesions. There was a small amount of loculated fluid along the medial aspect of the right lung base that had decreased from the prior exam. 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 There was no evidence of recurrent pancreatic cancer. He was seen by Dr. Crisoforo Oxford on 06/21/2014 and reported a 3 month history of abdominal pain, decreased oral intake and 25 lb weight loss. He had a CT scan of the chest, abdomen and pelvis that showed a rounded nodular opacity in the posterior lateral RML peripherally in the region of previous scarring with some adjacent curvilinear architectural distortion suggesting that this may be rounded atelectasis. His symptoms were felt to be due to pancreatic insuffiency and he was started on Creon. He says his symptoms have not improved and he was seen in the ER at [facility] on 08/01/2014 with epigastric abdominal pain, nausea, and bilious vomiting. A CT scan of the abdomen showed the lesion at the right lung base seen on the prior CT at [facility]. There was a suggestion of wall thickening in the pyloric region of the stomach and what was felt to be the afferent limb suggesting focal enteritis. He was stable with no signs of bowel obstrucition and was discharged  to follow up as an outpatient. He denies any cough or sputum production. He denies fever and chills.    Current Outpatient Prescriptions  Medication Sig Dispense Refill  . aspirin EC 81 MG tablet Take 81 mg by mouth daily.      Marland Kitchen ibuprofen (ADVIL,MOTRIN) 200 MG tablet Take 200 mg by mouth daily as needed for mild pain or moderate pain.      Marland Kitchen ondansetron (ZOFRAN) 4 MG tablet Take 1 tablet (4 mg total) by mouth every 6 (six) hours.  12 tablet  0  . oxyCODONE-acetaminophen (PERCOCET/ROXICET) 5-325 MG per tablet Take 2 tablets by mouth every 4 (four) hours as needed for severe pain.  15 tablet  0  . Pancrelipase, Lip-Prot-Amyl, (CREON) 24000 UNITS CPEP Take 24,000 units of lipase by mouth 5 (five) times daily. Take three with meals and one with snacks 15 minutes prior to eating       No current facility-administered medications for this visit.     Physical Exam: BP 110/76  Pulse 109  Resp 16  Ht 6' (1.829 m)  Wt 142 lb (64.411 kg)  BMI 19.25 kg/m2  SpO2 98% He looks chronically ill but in no distress.  There is no cervical or supraclavicular adenopathy.  Lung exam is clear.  The right thoracotomy scar looks fine. There are no skin lesions.  Cardiac exam shows a regular rate and rhythm with normal heart sounds.  Abdominal exam shows active bowel sounds. There are no palpable masses or organomegaly.  Diagnostic Tests:  CT report: [facility]  CT OF THE CHEST, ABDOMEN AND PELVIS WITH INTRAVENOUS CONTRAST, Jun 21, 2014 01:42:47 PM . INDICATION: Tumor assessment157.9 Pancreatic cancer ([place])  . ADDITIONAL HISTORY: Status post Whipple procedure in 2007 for stage IIB pancreatic cancer. Right lower lobectomy in 2013 for lung cancer. COMPARISON: Prior CTs, most recently 06/15/2013 . TECHNIQUE: After administration of intravenous contrast, axial images of the chest, abdomen and pelvis were obtained in the portal venous phase. Supplemental 2D reformatted images were generated  and reviewed as needed. . CHEST: . Chest wall/thoracic inlet: Within normal limits. . Thyroid: Subcentimeter left thyroid nodule, unchanged. . Mediastinum/hila: Within normal limits. No adenopathy. . Heart/vessels: Atherosclerosis of the thoracic aorta and great vessels of the mediastinum. Coronary artery disease. 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 . Lungs:  - Right lower lobectomy changes redemonstrated. - A few areas of groundglass opacity are noted in the right upper lobe (series 3, image 29, 40), along with a small amount of anteromedial peripheral opacity in the lower right upper lobe with some adjacent groundglass opacities. - 14 mm peripheral nodularity of the posterior lateral right middle lobe with some adjacent curvilinear architectural distortion (image 91); there is scar-like opacity in this region on the exam from 2014 but this opacity is more confluent on today's exam. - Biapical paraseptal emphysema. . Pleura: Similar small loculated fluid along the posterior medial margin of the right lung base. . ABDOMEN: . Liver: Interval resolution of the previous expansile thrombus within a right portal venous branch tracking between the middle and right hepatic veins on the 2014 study. No mass is identified. . Gallbladder/Biliary: Cholecystectomy. Choledochojejunostomy. Trace pneumobilia. Marland Kitchen Spleen: Within normal limits. . Pancreas: Pancreaticoduodenectomy and pancreaticojejunostomy. Atrophic remaining body and tail of the pancreas. . Adrenals: Within normal limits. . Kidneys: Scarring of the right kidney upper pole. No hydronephrosis. . Peritoneum/Mesenteries: Within normal limits. No adenopathy. . Extraperitoneum: Within normal limits. No adenopathy. . Gastrointestinal tract: Gastrojejunostomy. No bowel obstruction. . Vascular:  - Atherosclerosis. No abdominal aortic aneurysm. 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 - Interval resolution of previous expansile thrombus within a right portal venous branch. 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 Marland Kitchen PELVIS: . Peritoneum: Within normal limits. . Extraperitoneum: Within normal limits. . Ureters: Nonspecific focal dilation of the distal right ureter just prior to crossing the external iliac artery, decreased in prominence compared to the 2014 exam; no distal obstructing calculus is apparent. . Bladder: Within normal limits. . Reproductive System: Prostatomegaly with ~ 5 cm span, similar to prior. . Vascular: Atherosclerosis. . MSK: Degenerative changes of the spine. No acute osseous findings. . CONCLUSION: Rounded/nodular opacity in the peripheral posterior lateral right middle lobe in a region of previous scarring measuring 14 mm, with some curvilinear adjacent architectural distortion suggesting that this is rounded atelectasis. That said, in the context of the patient's indication and prior history, close attention on follow-up is advised; a chest CT within 3 months is recommended. A few small foci of ground glass opacity are present in the right upper lobe, along with a small amount of confluent opacity along the anteromedial peripheral lower right upper lobe, suggesting inflammatory changes secondary to infection. Attention on follow-up. Postsurgical changes of remote Whipple procedure and right lower lobectomy redemonstrated. Interval resolution of previous expansile thrombus within a right portal venous branch. Other findings  as described above. 06/21/2014 4:02 PM     CLINICAL DATA: Abdominal pain for 1 month, worsening tonight.  Vomiting. Previous history pancreatic cancer 2007 post Whipple's  procedure, chemotherapy, and radiation therapy. History of lung  cancer 2 years ago. 25 pound weight loss last month.  EXAM:  CT ABDOMEN AND PELVIS WITH CONTRAST    TECHNIQUE:  Multidetector CT imaging of the abdomen and pelvis was performed  using the standard protocol following bolus administration of  intravenous contrast.  CONTRAST: 37mL OMNIPAQUE IOHEXOL 300 MG/ML SOLN, 182mL OMNIPAQUE  IOHEXOL 300 MG/ML SOLN  COMPARISON: PET-CT scan 01/23/2012. CT abdomen and pelvis  07/12/2009.  FINDINGS:  There is a focal irregular nodular opacity in the right lung base  posteriorly, measuring about 2.4 by 1.7 cm. There is mild  spiculation. This was not present on the previous study. Developing  primary or metastatic lesion should be excluded.  Mild diffuse fatty infiltration in the liver. Pneumobilia consistent  with postoperative change. No bile duct dilatation. Previous partial  pancreatectomy. Remaining tail of the pancreas appears unremarkable.  Gallbladder is surgically absent. The spleen, adrenal glands,  kidneys, inferior vena cava, and retroperitoneal lymph nodes are  unremarkable. Calcification of the abdominal aorta. Stomach is not  abnormally distended. There is suggestion of wall thickening in the  pyloric region which may be due to incomplete distention or  inflammatory change. There is evidence of wall thickening of an  anterior bowel loop, probably representing the afferent limb of the  Roux-en-Y anastomosis. Focal enteritis should be considered. No  definite evidence of any residual or recurrent tumor. No free air or  free fluid in the abdomen.  Pelvis: Enlarged prostate gland, measuring 4.1 x 5 cm. Bladder is  mildly distended without wall thickening. No free or loculated  pelvic fluid collections. No pelvic mass or lymphadenopathy.  Appendix is not identified. No inflammatory changes demonstrated in  the right lower quadrant. No evidence of diverticulitis. The colon  is mostly decompressed.  IMPRESSION:  Developing irregular nodular opacity in the right lung base.  Developing primary neoplasm or metastasis should be excluded.   Postoperative changes with partial pancreatectomy consistent with  history of Whipple's procedure. There is suggestion of wall  thickening in the pyloric region of the stomach and an anterior  bowel loop, likely representing the afferent limb. Suggest focal  enteritis/inflammatory process. No definite evidence of recurrent or  metastatic disease. No evidence of bowel obstruction. Diffuse fatty  infiltration of the liver. Prostate gland enlargement.  Electronically Signed  By: Lucienne Capers M.D.  On: 08/01/2014 22:56    Impression:  He has a new lesion in the right lung base in an area of previous scarring. It is larger and more dense that on prior CT but has an unusual appearance for a lung cancer. It could be an area of rounded atelectasis but I think this warrants close follow up. He had an early stage completely resected lung cancer and the risk of recurrence is low. He is scheduled for a follow up chest CT at [facility] on 10/04/2014 and has an appt with Dr. Crisoforo Oxford a couple days later. It does concern me that he was doing so well for a long time but is now losing weight and not eating well.    Plan:  I will see him back in the second week of December for follow up and he will bring a copy of the chest CT so that I can review it.

## 2014-10-04 HISTORY — PX: ESOPHAGOGASTRODUODENOSCOPY: SHX1529

## 2014-10-12 ENCOUNTER — Ambulatory Visit: Payer: Medicare Other | Admitting: Surgery

## 2014-10-13 ENCOUNTER — Telehealth: Payer: Self-pay | Admitting: *Deleted

## 2014-11-08 DIAGNOSIS — C250 Malignant neoplasm of head of pancreas: Secondary | ICD-10-CM | POA: Diagnosis not present

## 2014-11-15 DIAGNOSIS — Z8507 Personal history of malignant neoplasm of pancreas: Secondary | ICD-10-CM | POA: Diagnosis not present

## 2014-11-15 DIAGNOSIS — R109 Unspecified abdominal pain: Secondary | ICD-10-CM | POA: Diagnosis not present

## 2014-11-15 DIAGNOSIS — Z5181 Encounter for therapeutic drug level monitoring: Secondary | ICD-10-CM | POA: Diagnosis not present

## 2014-11-15 DIAGNOSIS — M792 Neuralgia and neuritis, unspecified: Secondary | ICD-10-CM | POA: Diagnosis not present

## 2014-11-15 DIAGNOSIS — G8929 Other chronic pain: Secondary | ICD-10-CM | POA: Diagnosis not present

## 2014-11-17 DIAGNOSIS — R092 Respiratory arrest: Secondary | ICD-10-CM | POA: Diagnosis not present

## 2014-11-17 DIAGNOSIS — J449 Chronic obstructive pulmonary disease, unspecified: Secondary | ICD-10-CM | POA: Diagnosis not present

## 2014-11-24 DIAGNOSIS — Z8507 Personal history of malignant neoplasm of pancreas: Secondary | ICD-10-CM | POA: Diagnosis not present

## 2014-11-24 DIAGNOSIS — K529 Noninfective gastroenteritis and colitis, unspecified: Secondary | ICD-10-CM | POA: Diagnosis not present

## 2014-11-24 DIAGNOSIS — Z8719 Personal history of other diseases of the digestive system: Secondary | ICD-10-CM | POA: Diagnosis not present

## 2014-11-24 DIAGNOSIS — Z87891 Personal history of nicotine dependence: Secondary | ICD-10-CM | POA: Diagnosis not present

## 2014-11-24 DIAGNOSIS — I700 Atherosclerosis of aorta: Secondary | ICD-10-CM | POA: Diagnosis not present

## 2014-11-24 DIAGNOSIS — J439 Emphysema, unspecified: Secondary | ICD-10-CM | POA: Diagnosis not present

## 2014-11-24 DIAGNOSIS — K599 Functional intestinal disorder, unspecified: Secondary | ICD-10-CM | POA: Diagnosis not present

## 2014-11-24 DIAGNOSIS — I252 Old myocardial infarction: Secondary | ICD-10-CM | POA: Diagnosis not present

## 2014-11-24 DIAGNOSIS — R1013 Epigastric pain: Secondary | ICD-10-CM | POA: Diagnosis not present

## 2014-11-24 DIAGNOSIS — R911 Solitary pulmonary nodule: Secondary | ICD-10-CM | POA: Diagnosis not present

## 2014-11-24 DIAGNOSIS — R197 Diarrhea, unspecified: Secondary | ICD-10-CM | POA: Diagnosis not present

## 2014-11-24 DIAGNOSIS — R1033 Periumbilical pain: Secondary | ICD-10-CM | POA: Diagnosis not present

## 2014-11-24 DIAGNOSIS — R112 Nausea with vomiting, unspecified: Secondary | ICD-10-CM | POA: Diagnosis not present

## 2014-11-24 DIAGNOSIS — R188 Other ascites: Secondary | ICD-10-CM | POA: Diagnosis not present

## 2014-11-24 DIAGNOSIS — R55 Syncope and collapse: Secondary | ICD-10-CM | POA: Diagnosis not present

## 2014-12-18 DIAGNOSIS — R092 Respiratory arrest: Secondary | ICD-10-CM | POA: Diagnosis not present

## 2014-12-18 DIAGNOSIS — J449 Chronic obstructive pulmonary disease, unspecified: Secondary | ICD-10-CM | POA: Diagnosis not present

## 2015-01-03 DIAGNOSIS — Z859 Personal history of malignant neoplasm, unspecified: Secondary | ICD-10-CM | POA: Diagnosis not present

## 2015-01-03 DIAGNOSIS — G8929 Other chronic pain: Secondary | ICD-10-CM | POA: Diagnosis not present

## 2015-01-03 DIAGNOSIS — R109 Unspecified abdominal pain: Secondary | ICD-10-CM | POA: Diagnosis not present

## 2015-01-16 DIAGNOSIS — R092 Respiratory arrest: Secondary | ICD-10-CM | POA: Diagnosis not present

## 2015-01-16 DIAGNOSIS — J449 Chronic obstructive pulmonary disease, unspecified: Secondary | ICD-10-CM | POA: Diagnosis not present

## 2015-02-16 DIAGNOSIS — R092 Respiratory arrest: Secondary | ICD-10-CM | POA: Diagnosis not present

## 2015-02-16 DIAGNOSIS — J449 Chronic obstructive pulmonary disease, unspecified: Secondary | ICD-10-CM | POA: Diagnosis not present

## 2015-03-16 ENCOUNTER — Inpatient Hospital Stay (HOSPITAL_COMMUNITY): Payer: Medicare Other

## 2015-03-16 ENCOUNTER — Inpatient Hospital Stay (HOSPITAL_COMMUNITY)
Admission: EM | Admit: 2015-03-16 | Discharge: 2015-03-19 | DRG: 175 | Disposition: A | Discharge status: Home / Self Care | Payer: Medicare Other | Attending: Internal Medicine | Admitting: Internal Medicine

## 2015-03-16 ENCOUNTER — Encounter (HOSPITAL_COMMUNITY): Payer: Self-pay

## 2015-03-16 ENCOUNTER — Emergency Department (HOSPITAL_COMMUNITY): Payer: Medicare Other

## 2015-03-16 DIAGNOSIS — Z23 Encounter for immunization: Secondary | ICD-10-CM | POA: Diagnosis not present

## 2015-03-16 DIAGNOSIS — N3289 Other specified disorders of bladder: Secondary | ICD-10-CM | POA: Diagnosis not present

## 2015-03-16 DIAGNOSIS — I82403 Acute embolism and thrombosis of unspecified deep veins of lower extremity, bilateral: Secondary | ICD-10-CM

## 2015-03-16 DIAGNOSIS — D539 Nutritional anemia, unspecified: Secondary | ICD-10-CM | POA: Diagnosis present

## 2015-03-16 DIAGNOSIS — J449 Chronic obstructive pulmonary disease, unspecified: Secondary | ICD-10-CM | POA: Diagnosis not present

## 2015-03-16 DIAGNOSIS — Z8507 Personal history of malignant neoplasm of pancreas: Secondary | ICD-10-CM | POA: Diagnosis not present

## 2015-03-16 DIAGNOSIS — J9621 Acute and chronic respiratory failure with hypoxia: Secondary | ICD-10-CM | POA: Diagnosis present

## 2015-03-16 DIAGNOSIS — C259 Malignant neoplasm of pancreas, unspecified: Secondary | ICD-10-CM | POA: Diagnosis not present

## 2015-03-16 DIAGNOSIS — Z9981 Dependence on supplemental oxygen: Secondary | ICD-10-CM

## 2015-03-16 DIAGNOSIS — Z87891 Personal history of nicotine dependence: Secondary | ICD-10-CM

## 2015-03-16 DIAGNOSIS — Z85118 Personal history of other malignant neoplasm of bronchus and lung: Secondary | ICD-10-CM

## 2015-03-16 DIAGNOSIS — R0602 Shortness of breath: Secondary | ICD-10-CM | POA: Diagnosis not present

## 2015-03-16 DIAGNOSIS — M7989 Other specified soft tissue disorders: Secondary | ICD-10-CM | POA: Diagnosis not present

## 2015-03-16 DIAGNOSIS — I82411 Acute embolism and thrombosis of right femoral vein: Secondary | ICD-10-CM | POA: Diagnosis not present

## 2015-03-16 DIAGNOSIS — I252 Old myocardial infarction: Secondary | ICD-10-CM | POA: Diagnosis not present

## 2015-03-16 DIAGNOSIS — J189 Pneumonia, unspecified organism: Secondary | ICD-10-CM | POA: Diagnosis present

## 2015-03-16 DIAGNOSIS — I82432 Acute embolism and thrombosis of left popliteal vein: Secondary | ICD-10-CM | POA: Diagnosis not present

## 2015-03-16 DIAGNOSIS — Z823 Family history of stroke: Secondary | ICD-10-CM | POA: Diagnosis not present

## 2015-03-16 DIAGNOSIS — N508 Other specified disorders of male genital organs: Secondary | ICD-10-CM | POA: Diagnosis not present

## 2015-03-16 DIAGNOSIS — N5089 Other specified disorders of the male genital organs: Secondary | ICD-10-CM

## 2015-03-16 DIAGNOSIS — F17200 Nicotine dependence, unspecified, uncomplicated: Secondary | ICD-10-CM | POA: Diagnosis present

## 2015-03-16 DIAGNOSIS — Z7901 Long term (current) use of anticoagulants: Secondary | ICD-10-CM | POA: Diagnosis not present

## 2015-03-16 DIAGNOSIS — Z90411 Acquired partial absence of pancreas: Secondary | ICD-10-CM | POA: Diagnosis not present

## 2015-03-16 DIAGNOSIS — C3490 Malignant neoplasm of unspecified part of unspecified bronchus or lung: Secondary | ICD-10-CM | POA: Diagnosis not present

## 2015-03-16 DIAGNOSIS — I2699 Other pulmonary embolism without acute cor pulmonale: Principal | ICD-10-CM

## 2015-03-16 DIAGNOSIS — K6389 Other specified diseases of intestine: Secondary | ICD-10-CM | POA: Diagnosis not present

## 2015-03-16 DIAGNOSIS — R092 Respiratory arrest: Secondary | ICD-10-CM | POA: Diagnosis not present

## 2015-03-16 DIAGNOSIS — R0600 Dyspnea, unspecified: Secondary | ICD-10-CM | POA: Diagnosis not present

## 2015-03-16 DIAGNOSIS — I429 Cardiomyopathy, unspecified: Secondary | ICD-10-CM | POA: Diagnosis present

## 2015-03-16 LAB — BASIC METABOLIC PANEL
Anion gap: 6 (ref 5–15)
BUN: 8 mg/dL (ref 6–20)
CHLORIDE: 107 mmol/L (ref 101–111)
CO2: 22 mmol/L (ref 22–32)
CREATININE: 0.65 mg/dL (ref 0.61–1.24)
Calcium: 7.3 mg/dL — ABNORMAL LOW (ref 8.9–10.3)
GFR calc Af Amer: >60 mL/min (ref >60)
GFR calc non Af Amer: >60 mL/min (ref >60)
Glucose, Bld: 106 mg/dL — ABNORMAL HIGH (ref 65–99)
Potassium: 3.6 mmol/L (ref 3.5–5.1)
Sodium: 135 mmol/L (ref 135–145)

## 2015-03-16 LAB — CBC
HEMATOCRIT: 30 % — AB (ref 39.0–52.0)
Hemoglobin: 10.4 g/dL — ABNORMAL LOW (ref 13.0–17.0)
MCH: 36.7 pg — AB (ref 26.0–34.0)
MCHC: 34.7 g/dL (ref 30.0–36.0)
MCV: 106 fL — ABNORMAL HIGH (ref 78.0–100.0)
Platelets: 188 10*3/uL (ref 150–400)
RBC: 2.83 MIL/uL — ABNORMAL LOW (ref 4.22–5.81)
RDW: 14.4 % (ref 11.5–15.5)
WBC: 12.4 10*3/uL — ABNORMAL HIGH (ref 4.0–10.5)

## 2015-03-16 LAB — TROPONIN I: Troponin I: <0.03 ng/mL (ref <0.031)

## 2015-03-16 LAB — BRAIN NATRIURETIC PEPTIDE: B Natriuretic Peptide: 68 pg/mL (ref 0.0–100.0)

## 2015-03-16 MED ORDER — FUROSEMIDE 10 MG/ML IJ SOLN
40.0000 mg | Freq: Once | INTRAMUSCULAR | Status: AC
  Administered 2015-03-16: 40 mg via INTRAVENOUS
  Filled 2015-03-16: qty 4

## 2015-03-16 MED ORDER — SODIUM CHLORIDE 0.9 % IJ SOLN
3.0000 mL | Freq: Two times a day (BID) | INTRAMUSCULAR | Status: DC
  Administered 2015-03-17 – 2015-03-19 (×4): 3 mL via INTRAVENOUS

## 2015-03-16 MED ORDER — SODIUM CHLORIDE 0.9 % IJ SOLN: 3.0000 mL | INTRAMUSCULAR | Status: DC | PRN

## 2015-03-16 MED ORDER — ASPIRIN EC 81 MG PO TBEC
81.0000 mg | DELAYED_RELEASE_TABLET | Freq: Every day | ORAL | Status: DC
Start: 2015-03-17 — End: 2015-03-19
  Administered 2015-03-17 – 2015-03-19 (×3): 81 mg via ORAL
  Filled 2015-03-16 (×3): qty 1

## 2015-03-16 MED ORDER — METHYLPREDNISOLONE SODIUM SUCC 125 MG IJ SOLR
125.0000 mg | Freq: Once | INTRAMUSCULAR | Status: AC
  Administered 2015-03-16: 125 mg via INTRAVENOUS
  Filled 2015-03-16: qty 2

## 2015-03-16 MED ORDER — HEPARIN SODIUM (PORCINE) 5000 UNIT/ML IJ SOLN
5000.0000 [IU] | Freq: Three times a day (TID) | INTRAMUSCULAR | Status: DC
  Administered 2015-03-17: 5000 [IU] via SUBCUTANEOUS
  Filled 2015-03-16: qty 1

## 2015-03-16 MED ORDER — IOHEXOL 350 MG/ML SOLN: 100.0000 mL | Freq: Once | INTRAVENOUS | Status: AC | PRN

## 2015-03-16 MED ORDER — METHYLPREDNISOLONE SODIUM SUCC 125 MG IJ SOLR: 125.0000 mg | Freq: Two times a day (BID) | INTRAMUSCULAR | Status: DC

## 2015-03-16 MED ORDER — METHYLPREDNISOLONE SODIUM SUCC 125 MG IJ SOLR: 125.0000 mg | Freq: Once | INTRAMUSCULAR | Status: DC

## 2015-03-16 MED ORDER — DEXTROSE 5 % IV SOLN
500.0000 mg | INTRAVENOUS | Status: DC
  Administered 2015-03-17: 500 mg via INTRAVENOUS

## 2015-03-16 MED ORDER — DEXTROSE 5 % IV SOLN
1.0000 g | INTRAVENOUS | Status: DC
  Administered 2015-03-16: 1 g via INTRAVENOUS
  Filled 2015-03-16: qty 10

## 2015-03-16 MED ORDER — IPRATROPIUM-ALBUTEROL 0.5-2.5 (3) MG/3ML IN SOLN
3.0000 mL | Freq: Once | RESPIRATORY_TRACT | Status: AC
  Administered 2015-03-16: 3 mL via RESPIRATORY_TRACT
  Filled 2015-03-16: qty 3

## 2015-03-16 MED ORDER — SODIUM CHLORIDE 0.9 % IV SOLN: 250.0000 mL | INTRAVENOUS | Status: DC | PRN

## 2015-03-16 NOTE — ED Provider Notes (Signed)
CSN: [id number]     Arrival date & time 03/16/15  1930 History  This chart was scribed for Veryl Speak, MD by [place] Peace, ED Scribe. The patient was seen in [place]. The patient's care was started at 8:34 PM.    Chief Complaint  Patient presents with  . Shortness of Breath      Patient is a 54 y.o. male presenting with shortness of breath. The history is provided by the patient. No language interpreter was used.  Shortness of Breath Severity:  Severe Duration:  3 days Timing:  Constant Progression:  Worsening Relieved by:  Inhaler and oxygen Exacerbated by: laying down. Associated symptoms: abdominal pain and cough   Risk factors: hx of cancer   HPI Comments: Patrick Lee is a 54 y.o. male who presents to the Emergency Department complaining of SOB x 3 days with productive cough, leg swelling, and abdominal pain. Pt reports that when he lays down he feels as if he is "drowning". Pt also reports he is always cold. He denies any CHF or COPD in the past to his knowledge. Pt notes he has oxygen available at home and has been using it during the day, as well as at night to treat symptoms. History of Lung Cancer and Pancreatic Cancer. Pt is former smoker.    Past Medical History  Diagnosis Date  . Lateral epicondylitis   . Chest pain, unspecified   . Abdominal pain, other specified site   . Obstruction of bile duct   . Tobacco use disorder   . Pancreatic cancer      status post Whipple procedure.  . Anxiety   . Shortness of breath     uses oxygen at night- 2 liters   . Myocardial infarction 2007    during [place]- 2007  . Complication of anesthesia     pt. reports that he had MI while under anesth. for Whipple procedure  . Lung cancer    Past Surgical History  Procedure Laterality Date  . Whipple procedure  2007  . Percutaneous extraction of gallstones      [facility]  . Knee arthroscopy      R knee- at [facility]  . Cholecystectomy       open  . Video bronchoscopy  04/08/2012    Procedure: VIDEO BRONCHOSCOPY;  Surgeon: Gaye Pollack, MD;  Location: [facility] OR;  Service: Thoracic;  Laterality: N/A;   Family History  Problem Relation Age of Onset  . Stroke Father 42    Deceased  . Anesthesia problems Neg Hx    History  Substance Use Topics  . Smoking status: Former Smoker -- 1.00 packs/day for 28 years    Types: Cigarettes    Quit date: 01/02/2012  . Smokeless tobacco: Never Used  . Alcohol Use: Yes     Comment:  Rare alcohol use    Review of Systems  Respiratory: Positive for cough and shortness of breath.   Cardiovascular: Positive for leg swelling.  Gastrointestinal: Positive for abdominal pain.  All other systems reviewed and are negative.     Allergies  Review of patient's allergies indicates no known allergies.  Home Medications   Prior to Admission medications   Medication Sig Start Date End Date Taking? Authorizing Provider  aspirin EC 81 MG tablet Take 81 mg by mouth daily.   Yes Historical Provider, MD  ondansetron (ZOFRAN) 4 MG tablet Take 1 tablet (4 mg total) by mouth every 6 (six)  hours. Patient not taking: Reported on 03/16/2015 08/02/14   Ezequiel Essex, MD  oxyCODONE-acetaminophen (PERCOCET/ROXICET) 5-325 MG per tablet Take 2 tablets by mouth every 4 (four) hours as needed for severe pain. Patient not taking: Reported on 03/16/2015 08/02/14   Ezequiel Essex, MD  OXYGEN Inhale 2 L into the lungs daily.   Yes Historical Provider, MD   BP 114/74 mmHg  Pulse 108  Temp(Src) 98.3 F (36.8 C) (Oral)  Resp 20  Ht 6' (1.829 m)  Wt 148 lb 4 oz (67.246 kg)  BMI 20.10 kg/m2  SpO2 100% Physical Exam  Constitutional: He is oriented to person, place, and time. He appears well-developed and well-nourished. No distress.  HENT:  Head: Normocephalic and atraumatic.  Eyes: Conjunctivae and EOM are normal.  Neck: Neck supple. No tracheal deviation present.  Cardiovascular: Normal rate.    Pulmonary/Chest: Effort normal. No respiratory distress. He has wheezes.  Breath sounds are diminished on the right. Slight expiratory wheezes bilaterally.   Musculoskeletal: Normal range of motion.  2+ pitting edema of bilaterally LE's.   Neurological: He is alert and oriented to person, place, and time.  Skin: Skin is warm and dry.  Psychiatric: He has a normal mood and affect. His behavior is normal.  Nursing note and vitals reviewed.   ED Course  Procedures (including critical care time) Labs Review Labs Reviewed  BASIC METABOLIC PANEL  CBC  TROPONIN I  BRAIN NATRIURETIC PEPTIDE    Imaging Review No results found.   EKG Interpretation   Date/Time:  Thursday Mar 16 2015 20:29:09 EDT Ventricular Rate:  89 PR Interval:  133 QRS Duration: 79 QT Interval:  364 QTC Calculation: 443 R Axis:   62 Text Interpretation:  Sinus rhythm Borderline low voltage, extremity leads  Confirmed by Rilie Glanz  MD, Gurtaj Ruz ([id number]) on 03/16/2015 8:40:27 PM     Medications  ipratropium-albuterol (DUONEB) 0.5-2.5 (3) MG/3ML nebulizer solution 3 mL (3 mLs Nebulization Given 03/16/15 2032)   8:37 PM- Treatment plan was discussed with patient who verbalizes understanding and agrees.   MDM   Final diagnoses:  None    Patient presents here with shortness of breath and difficulty breathing that appears to be multi-factorial. He has a history of a pneumonectomy secondary to lung cancer as well as COPD and I suspect there is a component of an exacerbation of COPD to his symptoms. He also has swelling in his ankles and chest x-ray shows effusions which clinically appear to be CHF. His BNP however does not reflect this as it is normal. The patient becomes hypoxic off of oxygen and drops to 88%. Due to his hypoxia, I feel as though admission is indicated. I've spoken with Dr. Ernestina Patches from the hospitalist service who will evaluate patient in the ER and determine the final disposition.  I personally performed  the services described in this documentation, which was scribed in my presence. The recorded information has been reviewed and is accurate.      Veryl Speak, MD 03/16/15 [phone]

## 2015-03-16 NOTE — H&P (Signed)
Hospitalist Admission History and Physical  Patient name: Patrick Lee Medical record number: [id number] Date of birth: [birth year] Age: 54 y.o. Gender: male  Primary Care Provider: Anthoney Harada, MD  Chief Complaint: acute on chronic resp failure w/ hypoxia   History of Present Illness:This is a 54 y.o. year old male with significant past medical history of lung ca s/p lobectomy, pancreatic ca s/p whipple procedure, tobacco abuse, chronic resp failure on 2L intermittently presenting with acute on chronic resp failure w/ hypoxia. Pt reports increased WOB, productive cough, wheezing over past 2-3 days. Mild subjective fevers and chills at home. Intermittent smoking. Wears O2 occasionally. Mild CP w/ deep breathing. No nausea or diaphoresis. Also reports LE as well as scrotal swelling over same time frame.  Presented to ER afebrile, HR 100s-110s, resp 20s, BP 90s-110s, satting upper 90s on 2L, mid 80s on RA. WBC 12.4. hgb 10.4, Cr 0.65. Trop neg x1. EKG sinus rhythm. CXR Small bilateral pleural effusions. Linear atelectasis or infiltration in the lung bases. Emphysematous changes.  Assessment and Plan: Patrick Lee is a 54 y.o. year old male presenting with acute on chronic resp failure w/ hypoxia   Active Problems:   Acute on chronic respiratory failure with hypoxia   1- Acute on chronic resp failure w/ hypoxia -suspect likely multifactorial etiology w/ primary concern for pulmonary and ? Cardiac etiologies -? Infiltrate on imaging concerning for PNA-CAP protocol  -+ wheezing, + productive cough, + tobacco abuse, + emphysematous changes on imaging-COPD treatment  -IV solumedrol  -duonebs -ABG -2D ECHO  -some concern for VTE given hx/o tobacco abuse and Ca- Wells score 4+ -CTAngio pending  -supplemental O2 -follow   2- scrotal/LE edema -non tender GU exam-no concern for epididymitis at present -noted hx/o pancreatic ca s/p whipple procedure- ? Post procedural Lymphatic  redistribution  -suspect possible cardiogenic component -scrotal u/s -CT abd and pel pending  -LE u/s r/ o DVT -s/p IV lasix  -follow UOP and clinical exam   FEN/GI: heart healthy diet  Prophylaxis: sub heparin  Disposition: pending further evaluation Code Status:Full Code    Patient Active Problem List   Diagnosis Date Noted  . Acute on chronic respiratory failure with hypoxia 03/16/2015  . H/O pancreatic cancer 04/19/2013  . Former smoker 04/19/2013  . Abnormal transaminases 04/19/2013  . Malignant neoplasm of lower lobe, bronchus, or lung 08/11/2012  . Cardiomyopathy 04/17/2011  . Preoperative cardiovascular examination 04/17/2011  . Dyspnea 03/26/2011  . Chest pain, unspecified   . Tobacco use disorder    Past Medical History: Past Medical History  Diagnosis Date  . Lateral epicondylitis   . Chest pain, unspecified   . Abdominal pain, other specified site   . Obstruction of bile duct   . Tobacco use disorder   . Pancreatic cancer      status post Whipple procedure.  . Anxiety   . Shortness of breath     uses oxygen at night- 2 liters   . Myocardial infarction 2007    during [place]- 2007  . Complication of anesthesia     pt. reports that he had MI while under anesth. for Whipple procedure  . Lung cancer     Past Surgical History: Past Surgical History  Procedure Laterality Date  . Whipple procedure  2007  . Percutaneous extraction of gallstones      [facility]  . Knee arthroscopy      R [facility]- at [facility]  . Cholecystectomy  open  . Video bronchoscopy  04/08/2012    Procedure: VIDEO BRONCHOSCOPY;  Surgeon: Gaye Pollack, MD;  Location: [facility] OR;  Service: Thoracic;  Laterality: N/A;    Social History: History   Social History  . Marital Status: Divorced    Spouse Name: N/A  . Number of Children: 4  . Years of Education: N/A   Occupational History  . UNEMPLOYED    Social History Main Topics  . Smoking status:  Former Smoker -- 1.00 packs/day for 28 years    Types: Cigarettes    Quit date: 01/02/2012  . Smokeless tobacco: Never Used  . Alcohol Use: Yes     Comment:  Rare alcohol use  . Drug Use: Not on file     Comment: History of illicit substance abuse  . Sexual Activity: Not on file   Other Topics Concern  . None   Social History Narrative    Family History: Family History  Problem Relation Age of Onset  . Stroke Father 63    Deceased  . Anesthesia problems Neg Hx     Allergies: No Known Allergies  Current Facility-Administered Medications  Medication Dose Route Frequency Provider Last Rate Last Dose  . 0.9 %  sodium chloride infusion  250 mL Intravenous PRN Deneise Lever, MD      . Derrill Memo ON 03/17/2015] aspirin EC tablet 81 mg  81 mg Oral Daily Deneise Lever, MD      . azithromycin (ZITHROMAX) 500 mg in dextrose 5 % 250 mL IVPB  500 mg Intravenous Q24H Deneise Lever, MD      . cefTRIAXone (ROCEPHIN) 1 g in dextrose 5 % 50 mL IVPB  1 g Intravenous Q24H Deneise Lever, MD      . heparin injection 5,000 Units  5,000 Units Subcutaneous 3 times per day Deneise Lever, MD      . sodium chloride 0.9 % injection 3 mL  3 mL Intravenous Q12H Deneise Lever, MD      . sodium chloride 0.9 % injection 3 mL  3 mL Intravenous PRN Deneise Lever, MD       Current Outpatient Prescriptions  Medication Sig Dispense Refill  . aspirin EC 81 MG tablet Take 81 mg by mouth daily.    . ondansetron (ZOFRAN) 4 MG tablet Take 1 tablet (4 mg total) by mouth every 6 (six) hours. (Patient not taking: Reported on 03/16/2015) 12 tablet 0  . oxyCODONE-acetaminophen (PERCOCET/ROXICET) 5-325 MG per tablet Take 2 tablets by mouth every 4 (four) hours as needed for severe pain. (Patient not taking: Reported on 03/16/2015) 15 tablet 0  . OXYGEN Inhale 2 L into the lungs daily.     Review Of Systems: 12 point ROS negative except as noted above in HPI.  Physical Exam: Filed Vitals:   03/16/15 2300  BP:  96/63  Pulse: 109  Temp:   Resp: 23    General: alert and cooperative HEENT: PERRLA and extra ocular movement intact Heart: S1, S2 normal, no murmur, rub or gallop, regular rate and rhythm Lungs: unlabored breathing and mild wheezing  Abdomen: abdomen is soft without significant tenderness, masses, organomegaly or guarding Extremities: + mild generalized scrotal edema, 2 + edema bilaterally  Skin:no rashes Neurology: normal without focal findings  Labs and Imaging: Lab Results  Component Value Date/Time   NA 135 03/16/2015 08:30 PM   K 3.6 03/16/2015 08:30 PM   CL 107 03/16/2015 08:30 PM   CO2 22  03/16/2015 08:30 PM   BUN 8 03/16/2015 08:30 PM   CREATININE 0.65 03/16/2015 08:30 PM   CREATININE 1.05 02/22/2013 04:22 PM   GLUCOSE 106* 03/16/2015 08:30 PM   Lab Results  Component Value Date   WBC 12.4* 03/16/2015   HGB 10.4* 03/16/2015   HCT 30.0* 03/16/2015   MCV 106.0* 03/16/2015   PLT 188 03/16/2015    Dg Chest 2 View (if Patient Has Fever And/or Copd)  03/16/2015   CLINICAL DATA:  Increasing shortness of breath since Friday. Swelling in the legs and abdomen. Uses oxygen. History of lung cancer. Ex-smoker for 3 years.  EXAM: CHEST  2 VIEW  COMPARISON:  08/01/2014  FINDINGS: Pulmonary hyperinflation. Linear atelectasis or infiltration in the lung bases. No focal airspace disease or consolidation. Blunting of the costophrenic angles suggests small effusions. Heart size and pulmonary vascularity are normal.  IMPRESSION: Small bilateral pleural effusions. Linear atelectasis or infiltration in the lung bases. Emphysematous changes.   Electronically Signed   By: Lucienne Capers M.D.   On: 03/16/2015 21:15           Shanda Howells MD  Pager: [phone]

## 2015-03-16 NOTE — ED Notes (Signed)
Patient states that he has been getting increasingly short of breath since Friday. States that his legs and abdomen are swelling. States that he stays cold all the time. States that he has been using his oxygen during the day instead of just at night.

## 2015-03-16 NOTE — ED Notes (Signed)
   03/16/15 2017  Airway  Airway (WDL) WDL  Breathing  Breathing (WDL) X  Respiratory Pattern Labored;Dyspnea with exertion  Circulation  Circulation (WDL) WDL  Disability  Disability (WDL) WDL  Pt states increased SOB over the past 3 days. Pt says he has a productive cough, increase swelling in legs. Pt says he cant go to the restroom w/o getting SOB

## 2015-03-17 ENCOUNTER — Inpatient Hospital Stay (HOSPITAL_COMMUNITY): Payer: Medicare Other

## 2015-03-17 ENCOUNTER — Inpatient Hospital Stay (HOSPITAL_BASED_OUTPATIENT_CLINIC_OR_DEPARTMENT_OTHER): Payer: Medicare Other

## 2015-03-17 ENCOUNTER — Encounter (HOSPITAL_COMMUNITY): Payer: Self-pay

## 2015-03-17 DIAGNOSIS — Z85118 Personal history of other malignant neoplasm of bronchus and lung: Secondary | ICD-10-CM

## 2015-03-17 DIAGNOSIS — D539 Nutritional anemia, unspecified: Secondary | ICD-10-CM

## 2015-03-17 DIAGNOSIS — R0600 Dyspnea, unspecified: Secondary | ICD-10-CM

## 2015-03-17 DIAGNOSIS — J9621 Acute and chronic respiratory failure with hypoxia: Secondary | ICD-10-CM

## 2015-03-17 DIAGNOSIS — I2699 Other pulmonary embolism without acute cor pulmonale: Principal | ICD-10-CM

## 2015-03-17 DIAGNOSIS — I82403 Acute embolism and thrombosis of unspecified deep veins of lower extremity, bilateral: Secondary | ICD-10-CM

## 2015-03-17 DIAGNOSIS — Z720 Tobacco use: Secondary | ICD-10-CM

## 2015-03-17 LAB — D-DIMER, QUANTITATIVE: D-Dimer, Quant: 4.66 ug/mL-FEU — ABNORMAL HIGH (ref 0.00–0.48)

## 2015-03-17 LAB — COMPREHENSIVE METABOLIC PANEL
ALBUMIN: 1.4 g/dL — AB (ref 3.5–5.0)
ALK PHOS: 77 U/L (ref 38–126)
ALT: 14 U/L — ABNORMAL LOW (ref 17–63)
AST: 33 U/L (ref 15–41)
Anion gap: 6 (ref 5–15)
BILIRUBIN TOTAL: 0.6 mg/dL (ref 0.3–1.2)
BUN: 7 mg/dL (ref 6–20)
CO2: 23 mmol/L (ref 22–32)
Calcium: 7.1 mg/dL — ABNORMAL LOW (ref 8.9–10.3)
Chloride: 105 mmol/L (ref 101–111)
Creatinine, Ser: 0.78 mg/dL (ref 0.61–1.24)
GFR calc Af Amer: >60 mL/min (ref >60)
Glucose, Bld: 194 mg/dL — ABNORMAL HIGH (ref 65–99)
Potassium: 3.6 mmol/L (ref 3.5–5.1)
SODIUM: 134 mmol/L — AB (ref 135–145)
TOTAL PROTEIN: 4.1 g/dL — AB (ref 6.5–8.1)

## 2015-03-17 LAB — CBC WITH DIFFERENTIAL/PLATELET
BASOS PCT: 0 % (ref 0–1)
Basophils Absolute: 0 10*3/uL (ref 0.0–0.1)
EOS ABS: 0 10*3/uL (ref 0.0–0.7)
Eosinophils Relative: 0 % (ref 0–5)
HEMATOCRIT: 24.7 % — AB (ref 39.0–52.0)
Hemoglobin: 8.7 g/dL — ABNORMAL LOW (ref 13.0–17.0)
Lymphocytes Relative: 7 % — ABNORMAL LOW (ref 12–46)
Lymphs Abs: 0.8 10*3/uL (ref 0.7–4.0)
MCH: 37.2 pg — AB (ref 26.0–34.0)
MCHC: 35.2 g/dL (ref 30.0–36.0)
MCV: 105.6 fL — AB (ref 78.0–100.0)
MONOS PCT: 2 % — AB (ref 3–12)
Monocytes Absolute: 0.2 10*3/uL (ref 0.1–1.0)
NEUTROS ABS: 10 10*3/uL — AB (ref 1.7–7.7)
Neutrophils Relative %: 91 % — ABNORMAL HIGH (ref 43–77)
Platelets: 152 10*3/uL (ref 150–400)
RBC: 2.34 MIL/uL — ABNORMAL LOW (ref 4.22–5.81)
RDW: 14.7 % (ref 11.5–15.5)
WBC: 10.9 10*3/uL — ABNORMAL HIGH (ref 4.0–10.5)

## 2015-03-17 LAB — BLOOD GAS, ARTERIAL
ACID-BASE DEFICIT: 2.3 mmol/L — AB (ref 0.0–2.0)
Bicarbonate: 20.9 mEq/L (ref 20.0–24.0)
DRAWN BY: 382351
FIO2: 0.28 %
O2 SAT: 97.4 %
PO2 ART: 90 mmHg (ref 80.0–100.0)
Patient temperature: 37
TCO2: 19 mmol/L (ref 0–100)
pCO2 arterial: 28.9 mmHg — ABNORMAL LOW (ref 35.0–45.0)
pH, Arterial: 7.472 — ABNORMAL HIGH (ref 7.350–7.450)

## 2015-03-17 LAB — PROTIME-INR
INR: 1.27 (ref 0.00–1.49)
PROTHROMBIN TIME: 16 s — AB (ref 11.6–15.2)

## 2015-03-17 LAB — CLOSTRIDIUM DIFFICILE BY PCR: CDIFFPCR: NEGATIVE

## 2015-03-17 LAB — HEPARIN LEVEL (UNFRACTIONATED)
HEPARIN UNFRACTIONATED: 0.35 [IU]/mL (ref 0.30–0.70)
Heparin Unfractionated: <0.1 IU/mL — ABNORMAL LOW (ref 0.30–0.70)

## 2015-03-17 LAB — TROPONIN I
Troponin I: <0.03 ng/mL (ref <0.031)
Troponin I: <0.03 ng/mL (ref <0.031)
Troponin I: <0.03 ng/mL (ref <0.031)

## 2015-03-17 MED ORDER — ENSURE ENLIVE PO LIQD
237.0000 mL | Freq: Two times a day (BID) | ORAL | Status: DC
  Administered 2015-03-17 – 2015-03-19 (×5): 237 mL via ORAL

## 2015-03-17 MED ORDER — DEXTROSE 5 % IV SOLN
INTRAVENOUS | Status: AC
  Filled 2015-03-17: qty 500

## 2015-03-17 MED ORDER — IOHEXOL 350 MG/ML SOLN
100.0000 mL | Freq: Once | INTRAVENOUS | Status: AC | PRN
  Administered 2015-03-17: 100 mL via INTRAVENOUS

## 2015-03-17 MED ORDER — HEPARIN BOLUS VIA INFUSION
1500.0000 [IU] | Freq: Once | INTRAVENOUS | Status: AC
  Administered 2015-03-17: 1500 [IU] via INTRAVENOUS
  Filled 2015-03-17: qty 1500

## 2015-03-17 MED ORDER — ACETAMINOPHEN 325 MG PO TABS
650.0000 mg | ORAL_TABLET | ORAL | Status: DC | PRN
  Administered 2015-03-17 – 2015-03-18 (×3): 650 mg via ORAL
  Filled 2015-03-17 (×3): qty 2

## 2015-03-17 MED ORDER — HEPARIN (PORCINE) IN NACL 100-0.45 UNIT/ML-% IJ SOLN
1200.0000 [IU]/h | INTRAMUSCULAR | Status: DC
  Administered 2015-03-17: 1000 [IU]/h via INTRAVENOUS
  Administered 2015-03-18: 1200 [IU]/h via INTRAVENOUS
  Filled 2015-03-17 (×2): qty 250

## 2015-03-17 MED ORDER — LEVOFLOXACIN IN D5W 750 MG/150ML IV SOLN
750.0000 mg | INTRAVENOUS | Status: DC
  Administered 2015-03-18 – 2015-03-19 (×2): 750 mg via INTRAVENOUS
  Filled 2015-03-17 (×2): qty 150

## 2015-03-17 MED ORDER — METHYLPREDNISOLONE SODIUM SUCC 125 MG IJ SOLR
125.0000 mg | Freq: Two times a day (BID) | INTRAMUSCULAR | Status: DC
  Administered 2015-03-17 – 2015-03-18 (×4): 125 mg via INTRAVENOUS
  Filled 2015-03-17 (×4): qty 2

## 2015-03-17 MED ORDER — LEVOFLOXACIN IN D5W 750 MG/150ML IV SOLN
INTRAVENOUS | Status: AC
  Filled 2015-03-17: qty 150

## 2015-03-17 MED ORDER — MORPHINE SULFATE 2 MG/ML IJ SOLN: 1.0000 mg | Freq: Once | INTRAMUSCULAR | Status: DC

## 2015-03-17 MED ORDER — PNEUMOCOCCAL VAC POLYVALENT 25 MCG/0.5ML IJ INJ
0.5000 mL | INJECTION | INTRAMUSCULAR | Status: AC
  Administered 2015-03-18: 0.5 mL via INTRAMUSCULAR
  Filled 2015-03-17: qty 0.5

## 2015-03-17 NOTE — Progress Notes (Signed)
ANTICOAGULATION CONSULT NOTE  Pharmacy Consult for Heparin Indication: pulmonary embolus  No Known Allergies  Patient Measurements: Height: 6' (182.9 cm) Weight: 148 lb (67.132 kg) IBW/kg (Calculated) : 77.6 HEPARIN DW (KG): 67.1  Vital Signs: Temp: 97.5 F (36.4 C) (05/13 0654) Temp Source: Oral (05/13 0654) BP: 92/68 mmHg (05/13 0654) Pulse Rate: 73 (05/13 0654)  Labs:  Recent Labs  03/16/15 2030 03/16/15 2342 03/17/15 0605 03/17/15 1114  HGB 10.4*  --  8.7*  --   HCT 30.0*  --  24.7*  --   PLT 188  --  152  --   LABPROT 16.0*  --   --   --   INR 1.27  --   --   --   HEPARINUNFRC  --   --   --  <0.10*  CREATININE 0.65  --  0.78  --   TROPONINI <0.03 <0.03 <0.03 <0.03    Estimated Creatinine Clearance: 100.2 mL/min (by C-G formula based on Cr of 0.78).   Medications:  Scheduled:  . aspirin EC  81 mg Oral Daily  . feeding supplement (ENSURE ENLIVE)  237 mL Oral BID BM  . [START ON 03/18/2015] levofloxacin (LEVAQUIN) IV  750 mg Intravenous Q24H  . methylPREDNISolone (SOLU-MEDROL) injection  125 mg Intravenous Q12H  .  morphine injection  1-2 mg Intravenous Once  . [START ON 03/18/2015] pneumococcal 23 valent vaccine  0.5 mL Intramuscular Tomorrow-1000  . sodium chloride  3 mL Intravenous Q12H   Assessment: Okay for Protocol, heparin level below goal.  H/H decreased (monitor).  CT(+) PE, await long term AC plan.  Goal of Therapy:  Heparin level 0.3-0.7 units/ml Monitor platelets by anticoagulation protocol: Yes   Plan:  Increase Heparin to 1200 units/hr Repeat HL in 6-8 hours. Daily HL and CBC while on Heparin.   Biagio Quint R 03/17/2015,12:30 PM

## 2015-03-17 NOTE — Progress Notes (Signed)
ANTICOAGULATION CONSULT NOTE - Preliminary  Pharmacy Consult for HEPARIN Indication: pulmonary embolus  No Known Allergies  Patient Measurements: Height: 6' (182.9 cm) Weight: 148 lb (67.132 kg) IBW/kg (Calculated) : 77.6 HEPARIN DW (KG): 67.1   Vital Signs: Temp: 98.3 F (36.8 C) (05/13 0015) Temp Source: Oral (05/13 0015) BP: 101/64 mmHg (05/13 0015) Pulse Rate: 98 (05/13 0015)  Labs:  Recent Labs  03/16/15 2030 03/16/15 2342  HGB 10.4*  --   HCT 30.0*  --   PLT 188  --   CREATININE 0.65  --   TROPONINI <0.03 <0.03   Estimated Creatinine Clearance: 100.2 mL/min (by C-G formula based on Cr of 0.65).  Medical History: Past Medical History  Diagnosis Date  . Lateral epicondylitis   . Chest pain, unspecified   . Abdominal pain, other specified site   . Obstruction of bile duct   . Tobacco use disorder   . Pancreatic cancer      status post Whipple procedure.  . Anxiety   . Shortness of breath     uses oxygen at night- 2 liters   . Myocardial infarction 2007    during [place]- 2007  . Complication of anesthesia     pt. reports that he had MI while under anesth. for Whipple procedure  . Lung cancer     Assessment: Pt is a 54 yo M being initiated on a heparin drip for bilateral PE. Pt received 5000 units of heparin SQ at 0130. Will give a smaller bolus due to recent heparin administration. Baseline CBC is good.   Goal of Therapy:  Heparin level 0.3-0.7 units/ml   Plan:  Give 1500 units bolus x 1 followed by 1000 units/hr (16 units/kg/hr) Will order 6 hour heparin level Preliminary review of pertinent patient information completed.  Forestine Na clinical pharmacist will complete review during morning rounds to assess the patient and finalize treatment regimen.  Addison Lank [place], [place] 03/17/2015,2:44 AM

## 2015-03-17 NOTE — Progress Notes (Signed)
[facility] PROGRESS NOTE  Patrick Lee XTG:[id number] DOB: April 21, 1961 DOA: 03/16/2015 PCP: Anthoney Harada, MD  Assessment/Plan: 1. Acute on chronic hypoxic respiratory failure secondary to acute PE 1. Likely secondary to below LE DVT  2. PT has been started on therapeutic heparin 3. Given clot burden, would continue heparin for now and consider oral agents in the next 24-48hrs 2. Possible bronchitis vs PNA 1. Pt was started on empiric levaquin 2. Given acuity of pt's illness, will cont for now 3. LE edema secondary to B LE DVT 1. B DVT noted on LE dopplers 2. Pt is continued on heparin gtt as per above 4. COPD 1. Stable 2. Pt is continued on duonebs and scheduled solumedrol 3. Cont to wean o2 as tolerated 5. Tobacco abuse 1. Cessation done 2. Pt has hx of lung cancer 6. Macrocytic anemia 1. MCV of 105 2. Question E70 vs folic acid deficiency. Will check labs  Code Status: Full Family Communication: Pt in room (indicate person spoken with, relationship, and if by phone, the number) Disposition Plan: Pending   Consultants:    Procedures:    Antibiotics:  Levaquin 5/13>>> (indicate start date, and stop date if known)  HPI/Subjective: States feeling better today and breathing better  Objective: Filed Vitals:   03/16/15 2342 03/17/15 0015 03/17/15 0654 03/17/15 1409  BP: 100/61 1'01/64 92/68 87/55 '$  Pulse: 106 98 73 85  Temp:  98.3 F (36.8 C) 97.5 F (36.4 C) 97.9 F (36.6 C)  TempSrc:  Oral Oral Oral  Resp: '23 24 23 18  '$ Height:  6' (1.829 m)    Weight:  67.132 kg (148 lb)    SpO2: 100% 97% 100% 100%    Intake/Output Summary (Last 24 hours) at 03/17/15 1710 Last data filed at 03/17/15 1410  Gross per 24 hour  Intake 757.17 ml  Output    750 ml  Net   7.17 ml   Filed Weights   03/16/15 1933 03/17/15 0015  Weight: 67.246 kg (148 lb 4 oz) 67.132 kg (148 lb)    Exam:   General:  Awake, in and  Cardiovascular: regular, s1,  s2  Respiratory: normal resp effort, decreased BS  Abdomen: soft, nondistended  Musculoskeletal: perfused, no clubbing   Data Reviewed: Basic Metabolic Panel:  Recent Labs Lab 03/16/15 2030 03/17/15 0605  NA 135 134*  K 3.6 3.6  CL 107 105  CO2 22 23  GLUCOSE 106* 194*  BUN 8 7  CREATININE 0.65 0.78  CALCIUM 7.3* 7.1*   Liver Function Tests:  Recent Labs Lab 03/17/15 0605  AST 33  ALT 14*  ALKPHOS 77  BILITOT 0.6  PROT 4.1*  ALBUMIN 1.4*   No results for input(s): LIPASE, AMYLASE in the last 168 hours. No results for input(s): AMMONIA in the last 168 hours. CBC:  Recent Labs Lab 03/16/15 2030 03/17/15 0605  WBC 12.4* 10.9*  NEUTROABS  --  10.0*  HGB 10.4* 8.7*  HCT 30.0* 24.7*  MCV 106.0* 105.6*  PLT 188 152   Cardiac Enzymes:  Recent Labs Lab 03/16/15 2030 03/16/15 2342 03/17/15 0605 03/17/15 1114  TROPONINI <0.03 <0.03 <0.03 <0.03   BNP (last 3 results)  Recent Labs  03/16/15 2030  BNP 68.0    ProBNP (last 3 results) No results for input(s): PROBNP in the last 8760 hours.  CBG: No results for input(s): GLUCAP in the last 168 hours.  Recent Results (from the past 240 hour(s))  Culture, blood (routine x 2) Call MD  if unable to obtain prior to antibiotics being given     Status: None (Preliminary result)   Collection Time: 03/16/15 11:42 PM  Result Value Ref Range Status   Specimen Description BLOOD RIGHT ANTECUBITAL  Final   Special Requests BOTTLES DRAWN AEROBIC AND ANAEROBIC 11CC EACH  Final   Culture NO GROWTH 1 DAY  Final   Report Status PENDING  Incomplete  Culture, blood (routine x 2) Call MD if unable to obtain prior to antibiotics being given     Status: None (Preliminary result)   Collection Time: 03/16/15 11:53 PM  Result Value Ref Range Status   Specimen Description BLOOD LEFT HAND  Final   Special Requests BOTTLES DRAWN AEROBIC AND ANAEROBIC 5CC EACH  Final   Culture NO GROWTH 1 DAY  Final   Report Status PENDING   Incomplete  Clostridium Difficile by PCR     Status: None   Collection Time: 03/17/15 11:05 AM  Result Value Ref Range Status   C difficile by pcr NEGATIVE NEGATIVE Final     Studies: Dg Chest 2 View (if Patient Has Fever And/or Copd)  03/16/2015   CLINICAL DATA:  Increasing shortness of breath since Friday. Swelling in the legs and abdomen. Uses oxygen. History of lung cancer. Ex-smoker for 3 years.  EXAM: CHEST  2 VIEW  COMPARISON:  08/01/2014  FINDINGS: Pulmonary hyperinflation. Linear atelectasis or infiltration in the lung bases. No focal airspace disease or consolidation. Blunting of the costophrenic angles suggests small effusions. Heart size and pulmonary vascularity are normal.  IMPRESSION: Small bilateral pleural effusions. Linear atelectasis or infiltration in the lung bases. Emphysematous changes.   Electronically Signed   By: Lucienne Capers M.D.   On: 03/16/2015 21:15   Ct Angio Chest Pe W/cm &/or Wo Cm  03/17/2015   CLINICAL DATA:  History of lung cancer post lobectomy, pancreatic cancer post Whipple procedure, chronic respiratory failure. Now presents with acute on chronic respiratory failure with hypoxia. Scrotal swelling.  EXAM: CT ANGIOGRAPHY CHEST  CT ABDOMEN AND PELVIS WITH CONTRAST  TECHNIQUE: Multidetector CT imaging of the chest was performed using the standard protocol during bolus administration of intravenous contrast. Multiplanar CT image reconstructions and MIPs were obtained to evaluate the vascular anatomy. Multidetector CT imaging of the abdomen and pelvis was performed using the standard protocol during bolus administration of intravenous contrast.  CONTRAST:  130m OMNIPAQUE IOHEXOL 350 MG/ML SOLN  COMPARISON:  CT abdomen and pelvis 08/01/2014  FINDINGS: CTA CHEST FINDINGS  Technically adequate study with good opacification of the central and segmental pulmonary arteries. Filling defects are demonstrated in bilateral lower lobe pulmonary arteries and in the right upper  lung pulmonary artery consistent with acute pulmonary embolus. RV to LV ratio is normal. No evidence of right heart strain.  Normal heart size. Normal caliber thoracic aorta. No evidence of aortic dissection. Great vessel origins are patent. No significant lymphadenopathy in the chest. Esophagus is decompressed.  Moderate right and small left pleural effusions. Atelectasis or infiltration in the lung bases. Patchy nodular airspace disease in both lungs is likely to represent inflammatory process. Airways appear patent. No pneumothorax. Emphysematous changes in the upper lungs.  Mild degenerative changes in the spine. No destructive bone lesions.  CT ABDOMEN and PELVIS FINDINGS  Surgical absence of the gallbladder. Surgical absence of the head and body of the pancreas. Pancreatic tail appears intact. No pancreatic ductal dilatation. No bile duct dilatation. Diffuse fatty atrophy of the liver. Spleen, adrenal glands, kidneys, inferior vena  cava, and retroperitoneal lymph nodes are unremarkable. Calcification and atherosclerotic changes in the abdominal aorta. No aneurysm. Stomach, small bowel, and colon are not abnormally distended and contrast material flows through to the rectum without evidence of small bowel or colonic obstruction. However, there is diffuse wall thickening throughout the small bowel and in the colon which may indicate enteritis and colitis. The superior mesenteric artery and vein are patent. No pneumatosis. No free air.  Pelvis: Appendix is not identified. Free fluid in the pelvis extending down from the abdomen consistent with ascites. Bladder wall is mildly thickened which may indicate cystitis. Prostate gland is enlarged. No pelvic lymphadenopathy. Degenerative changes in the spine and hips. No destructive bone lesions.  Review of the MIP images confirms the above findings.  IMPRESSION: Positive study for bilateral pulmonary emboli. No right heart strain. Bilateral pleural effusions with  basilar atelectasis. Patchy nodular airspace disease in both lungs is likely inflammatory. Consider follow-up in 3 months to exclude underlying pulmonary nodules.  Diffuse abdominal and pelvic ascites. Diffuse small and large bowel wall thickening suggesting enteritis and colitis. Bladder wall is thickened which may indicate cystitis. Diffuse fatty infiltration of the liver.  These results were called by telephone at the time of interpretation on 03/17/2015 at 2:17 am to Dr. Shanda Howells , who verbally acknowledged these results.   Electronically Signed   By: Lucienne Capers M.D.   On: 03/17/2015 02:21   US Scrotum  03/17/2015   CLINICAL DATA:  Scrotal swelling.  Pancreatic cancer and lung cancer  EXAM: SCROTAL ULTRASOUND  DOPPLER ULTRASOUND OF THE TESTICLES  TECHNIQUE: Complete ultrasound examination of the testicles, epididymis, and other scrotal structures was performed. Color and spectral Doppler ultrasound were also utilized to evaluate blood flow to the testicles.  COMPARISON:  None.  FINDINGS: Right testicle  Measurements: 4.2 x 1.8 x 3.2 cm. No mass or microlithiasis visualized.  Left testicle  Measurements: 3.9 x 2.1 x 2.9 cm. 2 mm cyst superior pole. No mass or microlithiasis visualized.  Right epididymis: Multiple small cysts in the epididymis. Normal vascularity by Doppler.  Left epididymis: Small cysts in the epididymis. Normal vascularity by Doppler.  Hydrocele:  Small bilateral hydrocele  Varicocele:  None visualized.  Pulsed Doppler interrogation of both testes demonstrates normal low resistance arterial and venous waveforms bilaterally.  IMPRESSION: Negative for testicular torsion  Negative for testicular mass  No areas of increased vascularity are noted. Small bilateral hydrocele.   Electronically Signed   By: Franchot Gallo M.D.   On: 03/17/2015 10:12   Ct Abdomen Pelvis W Contrast  03/17/2015   CLINICAL DATA:  History of lung cancer post lobectomy, pancreatic cancer post Whipple procedure,  chronic respiratory failure. Now presents with acute on chronic respiratory failure with hypoxia. Scrotal swelling.  EXAM: CT ANGIOGRAPHY CHEST  CT ABDOMEN AND PELVIS WITH CONTRAST  TECHNIQUE: Multidetector CT imaging of the chest was performed using the standard protocol during bolus administration of intravenous contrast. Multiplanar CT image reconstructions and MIPs were obtained to evaluate the vascular anatomy. Multidetector CT imaging of the abdomen and pelvis was performed using the standard protocol during bolus administration of intravenous contrast.  CONTRAST:  167m OMNIPAQUE IOHEXOL 350 MG/ML SOLN  COMPARISON:  CT abdomen and pelvis 08/01/2014  FINDINGS: CTA CHEST FINDINGS  Technically adequate study with good opacification of the central and segmental pulmonary arteries. Filling defects are demonstrated in bilateral lower lobe pulmonary arteries and in the right upper lung pulmonary artery consistent with acute pulmonary embolus.  RV to LV ratio is normal. No evidence of right heart strain.  Normal heart size. Normal caliber thoracic aorta. No evidence of aortic dissection. Great vessel origins are patent. No significant lymphadenopathy in the chest. Esophagus is decompressed.  Moderate right and small left pleural effusions. Atelectasis or infiltration in the lung bases. Patchy nodular airspace disease in both lungs is likely to represent inflammatory process. Airways appear patent. No pneumothorax. Emphysematous changes in the upper lungs.  Mild degenerative changes in the spine. No destructive bone lesions.  CT ABDOMEN and PELVIS FINDINGS  Surgical absence of the gallbladder. Surgical absence of the head and body of the pancreas. Pancreatic tail appears intact. No pancreatic ductal dilatation. No bile duct dilatation. Diffuse fatty atrophy of the liver. Spleen, adrenal glands, kidneys, inferior vena cava, and retroperitoneal lymph nodes are unremarkable. Calcification and atherosclerotic changes in  the abdominal aorta. No aneurysm. Stomach, small bowel, and colon are not abnormally distended and contrast material flows through to the rectum without evidence of small bowel or colonic obstruction. However, there is diffuse wall thickening throughout the small bowel and in the colon which may indicate enteritis and colitis. The superior mesenteric artery and vein are patent. No pneumatosis. No free air.  Pelvis: Appendix is not identified. Free fluid in the pelvis extending down from the abdomen consistent with ascites. Bladder wall is mildly thickened which may indicate cystitis. Prostate gland is enlarged. No pelvic lymphadenopathy. Degenerative changes in the spine and hips. No destructive bone lesions.  Review of the MIP images confirms the above findings.  IMPRESSION: Positive study for bilateral pulmonary emboli. No right heart strain. Bilateral pleural effusions with basilar atelectasis. Patchy nodular airspace disease in both lungs is likely inflammatory. Consider follow-up in 3 months to exclude underlying pulmonary nodules.  Diffuse abdominal and pelvic ascites. Diffuse small and large bowel wall thickening suggesting enteritis and colitis. Bladder wall is thickened which may indicate cystitis. Diffuse fatty infiltration of the liver.  These results were called by telephone at the time of interpretation on 03/17/2015 at 2:17 am to Dr. Shanda Howells , who verbally acknowledged these results.   Electronically Signed   By: Lucienne Capers M.D.   On: 03/17/2015 02:21   US Venous Img Lower Bilateral  03/17/2015   CLINICAL DATA:  Bilateral leg swelling, 2-3 days duration. Recent diagnosis of pulmonary emboli.  EXAM: BILATERAL LOWER EXTREMITY VENOUS DOPPLER ULTRASOUND  TECHNIQUE: Gray-scale sonography with graded compression, as well as color Doppler and duplex ultrasound were performed to evaluate the lower extremity deep venous systems from the level of the common femoral vein and including the common  femoral, femoral, profunda femoral, popliteal and calf veins including the posterior tibial, peroneal and gastrocnemius veins when visible. The superficial great saphenous vein was also interrogated. Spectral Doppler was utilized to evaluate flow at rest and with distal augmentation maneuvers in the common femoral, femoral and popliteal veins.  COMPARISON:  None.  FINDINGS: RIGHT LOWER EXTREMITY  Common Femoral Vein: Occlusive thrombus at this level.  Saphenofemoral Junction: No evidence of thrombus. Normal compressibility and flow on color Doppler imaging.  Profunda Femoral Vein: Occlusive thrombus at this level.  Femoral Vein: No evidence of thrombus. Normal compressibility, respiratory phasicity and response to augmentation.  Popliteal Vein: No evidence of thrombus. Normal compressibility, respiratory phasicity and response to augmentation.  Calf Veins: No evidence of thrombus. Normal compressibility and flow on color Doppler imaging.  Superficial Great Saphenous Vein: No evidence of thrombus. Normal compressibility and flow on color  Doppler imaging.  Venous Reflux:  None.  Other Findings:  None.  LEFT LOWER EXTREMITY  Common Femoral Vein: No evidence of thrombus. Normal compressibility, respiratory phasicity and response to augmentation.  Saphenofemoral Junction: No evidence of thrombus. Normal compressibility and flow on color Doppler imaging.  Profunda Femoral Vein: No evidence of thrombus. Normal compressibility and flow on color Doppler imaging.  Femoral Vein: No evidence of thrombus. Normal compressibility, respiratory phasicity and response to augmentation.  Popliteal Vein: Occlusive thrombus at this level.  Calf Veins: No evidence of thrombus. Normal compressibility and flow on color Doppler imaging.  Superficial Great Saphenous Vein: No evidence of thrombus. Normal compressibility and flow on color Doppler imaging.  Venous Reflux:  None.  Other Findings:  None.  IMPRESSION: Study is positive for deep  venous thrombosis on the right affecting the common femoral vein an the profunda femoral vein. The study is positive for deep venous thrombosis on the left affecting the popliteal vein.   Electronically Signed   By: Nelson Chimes M.D.   On: 03/17/2015 09:57   [place] Art/ven Flow Abd Pelv Doppler  03/17/2015   CLINICAL DATA:  Scrotal swelling.  Pancreatic cancer and lung cancer  EXAM: SCROTAL ULTRASOUND  DOPPLER ULTRASOUND OF THE TESTICLES  TECHNIQUE: Complete ultrasound examination of the testicles, epididymis, and other scrotal structures was performed. Color and spectral Doppler ultrasound were also utilized to evaluate blood flow to the testicles.  COMPARISON:  None.  FINDINGS: Right testicle  Measurements: 4.2 x 1.8 x 3.2 cm. No mass or microlithiasis visualized.  Left testicle  Measurements: 3.9 x 2.1 x 2.9 cm. 2 mm cyst superior pole. No mass or microlithiasis visualized.  Right epididymis: Multiple small cysts in the epididymis. Normal vascularity by Doppler.  Left epididymis: Small cysts in the epididymis. Normal vascularity by Doppler.  Hydrocele:  Small bilateral hydrocele  Varicocele:  None visualized.  Pulsed Doppler interrogation of both testes demonstrates normal low resistance arterial and venous waveforms bilaterally.  IMPRESSION: Negative for testicular torsion  Negative for testicular mass  No areas of increased vascularity are noted. Small bilateral hydrocele.   Electronically Signed   By: Franchot Gallo M.D.   On: 03/17/2015 10:12    Scheduled Meds: . aspirin EC  81 mg Oral Daily  . feeding supplement (ENSURE ENLIVE)  237 mL Oral BID BM  . [START ON 03/18/2015] levofloxacin (LEVAQUIN) IV  750 mg Intravenous Q24H  . methylPREDNISolone (SOLU-MEDROL) injection  125 mg Intravenous Q12H  .  morphine injection  1-2 mg Intravenous Once  . [START ON 03/18/2015] pneumococcal 23 valent vaccine  0.5 mL Intramuscular Tomorrow-1000  . sodium chloride  3 mL Intravenous Q12H   Continuous Infusions: .  heparin 1,200 Units/hr (03/17/15 1235)    Active Problems:   Acute on chronic respiratory failure with hypoxia   Humbert Morozov, [facility] Pager [phone]. If 7PM-7AM, please contact night-coverage at [email or web address], password [facility] 03/17/2015, 5:10 PM  LOS: 1 day

## 2015-03-17 NOTE — Care Management Note (Signed)
Case Management Note  Patient Details  Name: Patrick Lee MRN: [id number] Date of Birth: November 24, 1960  Subjective/Objective:                  Pt admitted from home with respiratory failure. Pt lives with his son and will return home at discharge. Pt is independent with ADL's. Pt does have home O2 that he wears at HS at 2 liters with [employer].  Action/Plan: Cm did schedule pt PCP appt and documented on AVS. Will continue to follow for discharge planning needs.  Expected Discharge Date:                  Expected Discharge Plan:  [place]  In-House Referral:  NA  Discharge planning Services  CM Consult  Post Acute Care Choice:    Choice offered to:     DME Arranged:    DME Agency:     HH Arranged:    HH Agency:     Status of Service:  In process, will continue to follow  Medicare Important Message Given:  Yes Date Medicare IM Given:  03/17/15 Medicare IM give by:  Christinia Gully, RN BSN CM Date Additional Medicare IM Given:    Additional Medicare Important Message give by:     If discussed at [place] of Stay Meetings, dates discussed:    Additional Comments:  Joylene Draft, RN 03/17/2015, 11:28 AM

## 2015-03-17 NOTE — Progress Notes (Signed)
Initial Nutrition Assessment  INTERVENTION:  Ensure Enlive (each supplement provides 350kcal and 20 grams of protein)  NUTRITION DIAGNOSIS:  Impaired nutrient utilization related to altered GI function as evidenced by estimated needs.  Increased nutrient needs related to acute on chronic respiratory failure AEB estimated needs  GOAL:  Patient will meet greater than or equal to 90% of their needs  MONITOR:  PO intake, Supplement acceptance, Weight trends  REASON FOR ASSESSMENT:  Malnutrition Screening Tool    ASSESSMENT: Pt has hx of pancreatic cancer s/p whipple procedure 2007. Lung cancer s/p lobectomy. Presents with acute on chronic respiratory failure, LE edema and is a smoker. His usual body weight has fluctuated between 142-148# over the past 8 months. He endorses fair appetite and says he eats ad lib, regular meals at home and drinks Ensure "sometimes" once daily. Nutrition focused physical exam deferred. Suspect malnutrition related to chronic COPD.  Height:  Ht Readings from Last 1 Encounters:  03/17/15 6' (1.829 m)    Weight:  Wt Readings from Last 1 Encounters:  03/17/15 148 lb (67.132 kg)    Ideal Body Weight:  81 kg  Wt Readings from Last 10 Encounters:  03/17/15 148 lb (67.132 kg)  08/17/14 142 lb (64.411 kg)  08/01/14 142 lb (64.411 kg)  06/23/13 166 lb (75.297 kg)  04/19/13 166 lb 3.2 oz (75.388 kg)  03/25/13 169 lb (76.658 kg)  02/22/13 170 lb (77.111 kg)  01/12/13 171 lb (77.565 kg)  08/11/12 152 lb (68.947 kg)  04/28/12 150 lb (68.04 kg)    BMI:  Body mass index is 20.07 kg/(m^2). normal range  Estimated Nutritional Needs:  Kcal:   2010-2345   Protein:   87-99  Fluid:   2.0-2.3 liters daily  Skin:   reviewed, no issues  Diet Order:  Diet Heart Room service appropriate?: Yes; Fluid consistency:: Thin  EDUCATION NEEDS:  No education needs identified at this time   Intake/Output Summary (Last 24 hours) at 03/17/15 1355 Last data  filed at 03/17/15 0655  Gross per 24 hour  Intake 277.17 ml  Output    750 ml  Net -472.83 ml    Last BM:  03/17/15  Colman Cater MS,RD,CSG,LDN Office: [phone] Pager: [phone]

## 2015-03-18 DIAGNOSIS — Z7901 Long term (current) use of anticoagulants: Secondary | ICD-10-CM | POA: Diagnosis not present

## 2015-03-18 DIAGNOSIS — I82432 Acute embolism and thrombosis of left popliteal vein: Secondary | ICD-10-CM | POA: Diagnosis not present

## 2015-03-18 DIAGNOSIS — I429 Cardiomyopathy, unspecified: Secondary | ICD-10-CM | POA: Diagnosis not present

## 2015-03-18 DIAGNOSIS — Z85118 Personal history of other malignant neoplasm of bronchus and lung: Secondary | ICD-10-CM | POA: Diagnosis not present

## 2015-03-18 DIAGNOSIS — Z8507 Personal history of malignant neoplasm of pancreas: Secondary | ICD-10-CM | POA: Diagnosis not present

## 2015-03-18 DIAGNOSIS — Z23 Encounter for immunization: Secondary | ICD-10-CM | POA: Diagnosis not present

## 2015-03-18 DIAGNOSIS — J189 Pneumonia, unspecified organism: Secondary | ICD-10-CM | POA: Diagnosis not present

## 2015-03-18 DIAGNOSIS — J449 Chronic obstructive pulmonary disease, unspecified: Secondary | ICD-10-CM | POA: Diagnosis not present

## 2015-03-18 DIAGNOSIS — Z90411 Acquired partial absence of pancreas: Secondary | ICD-10-CM | POA: Diagnosis not present

## 2015-03-18 DIAGNOSIS — D539 Nutritional anemia, unspecified: Secondary | ICD-10-CM | POA: Diagnosis not present

## 2015-03-18 DIAGNOSIS — J9621 Acute and chronic respiratory failure with hypoxia: Secondary | ICD-10-CM | POA: Diagnosis not present

## 2015-03-18 DIAGNOSIS — I252 Old myocardial infarction: Secondary | ICD-10-CM | POA: Diagnosis not present

## 2015-03-18 DIAGNOSIS — I824Z3 Acute embolism and thrombosis of unspecified deep veins of distal lower extremity, bilateral: Secondary | ICD-10-CM

## 2015-03-18 DIAGNOSIS — I82411 Acute embolism and thrombosis of right femoral vein: Secondary | ICD-10-CM | POA: Diagnosis not present

## 2015-03-18 DIAGNOSIS — Z9981 Dependence on supplemental oxygen: Secondary | ICD-10-CM | POA: Diagnosis not present

## 2015-03-18 DIAGNOSIS — Z87891 Personal history of nicotine dependence: Secondary | ICD-10-CM | POA: Diagnosis not present

## 2015-03-18 DIAGNOSIS — I2699 Other pulmonary embolism without acute cor pulmonale: Secondary | ICD-10-CM | POA: Diagnosis not present

## 2015-03-18 DIAGNOSIS — Z823 Family history of stroke: Secondary | ICD-10-CM | POA: Diagnosis not present

## 2015-03-18 LAB — CBC WITH DIFFERENTIAL/PLATELET
BASOS ABS: 0 10*3/uL (ref 0.0–0.1)
Basophils Relative: 0 % (ref 0–1)
Eosinophils Absolute: 0 10*3/uL (ref 0.0–0.7)
Eosinophils Relative: 0 % (ref 0–5)
HEMATOCRIT: 24.2 % — AB (ref 39.0–52.0)
Hemoglobin: 8.7 g/dL — ABNORMAL LOW (ref 13.0–17.0)
LYMPHS ABS: 0.9 10*3/uL (ref 0.7–4.0)
Lymphocytes Relative: 5 % — ABNORMAL LOW (ref 12–46)
MCH: 38 pg — ABNORMAL HIGH (ref 26.0–34.0)
MCHC: 36 g/dL (ref 30.0–36.0)
MCV: 105.7 fL — ABNORMAL HIGH (ref 78.0–100.0)
MONOS PCT: 4 % (ref 3–12)
Monocytes Absolute: 0.8 10*3/uL (ref 0.1–1.0)
Neutro Abs: 17.6 10*3/uL — ABNORMAL HIGH (ref 1.7–7.7)
Neutrophils Relative %: 91 % — ABNORMAL HIGH (ref 43–77)
PLATELETS: 181 10*3/uL (ref 150–400)
RBC: 2.29 MIL/uL — AB (ref 4.22–5.81)
RDW: 15 % (ref 11.5–15.5)
WBC: 19.4 10*3/uL — ABNORMAL HIGH (ref 4.0–10.5)

## 2015-03-18 LAB — COMPREHENSIVE METABOLIC PANEL
ALT: 14 U/L — AB (ref 17–63)
ANION GAP: 4 — AB (ref 5–15)
AST: 20 U/L (ref 15–41)
Albumin: 1.4 g/dL — ABNORMAL LOW (ref 3.5–5.0)
Alkaline Phosphatase: 76 U/L (ref 38–126)
BUN: 8 mg/dL (ref 6–20)
CO2: 24 mmol/L (ref 22–32)
Calcium: 7.7 mg/dL — ABNORMAL LOW (ref 8.9–10.3)
Chloride: 109 mmol/L (ref 101–111)
Creatinine, Ser: 0.51 mg/dL — ABNORMAL LOW (ref 0.61–1.24)
GFR calc Af Amer: >60 mL/min (ref >60)
GFR calc non Af Amer: >60 mL/min (ref >60)
Glucose, Bld: 195 mg/dL — ABNORMAL HIGH (ref 65–99)
POTASSIUM: 3.5 mmol/L (ref 3.5–5.1)
Sodium: 137 mmol/L (ref 135–145)
Total Bilirubin: 0.5 mg/dL (ref 0.3–1.2)
Total Protein: 3.9 g/dL — ABNORMAL LOW (ref 6.5–8.1)

## 2015-03-18 LAB — HIV ANTIBODY (ROUTINE TESTING W REFLEX): HIV Screen 4th Generation wRfx: NONREACTIVE

## 2015-03-18 LAB — HEPARIN LEVEL (UNFRACTIONATED): Heparin Unfractionated: 0.54 IU/mL (ref 0.30–0.70)

## 2015-03-18 MED ORDER — ALBUTEROL SULFATE (2.5 MG/3ML) 0.083% IN NEBU
INHALATION_SOLUTION | RESPIRATORY_TRACT | Status: AC
  Administered 2015-03-18: 2.5 mg
  Filled 2015-03-18: qty 3

## 2015-03-18 MED ORDER — ENOXAPARIN SODIUM 100 MG/ML ~~LOC~~ SOLN
1.5000 mg/kg | SUBCUTANEOUS | Status: DC
  Administered 2015-03-18 – 2015-03-19 (×2): 100 mg via SUBCUTANEOUS
  Filled 2015-03-18 (×2): qty 1

## 2015-03-18 MED ORDER — IPRATROPIUM-ALBUTEROL 0.5-2.5 (3) MG/3ML IN SOLN: 3.0000 mL | RESPIRATORY_TRACT | Status: DC | PRN

## 2015-03-18 MED ORDER — ENOXAPARIN SODIUM 100 MG/ML ~~LOC~~ SOLN: 1.5000 mg/kg | SUBCUTANEOUS | Status: DC

## 2015-03-18 MED ORDER — IPRATROPIUM-ALBUTEROL 0.5-2.5 (3) MG/3ML IN SOLN: 3.0000 mL | RESPIRATORY_TRACT | Status: DC

## 2015-03-18 NOTE — Progress Notes (Signed)
Duonebs  treatments have been ordered q4 prn for patient for wheezing or sob.

## 2015-03-18 NOTE — Progress Notes (Addendum)
ANTICOAGULATION CONSULT NOTE  Pharmacy Consult for Heparin Indication: pulmonary embolus  No Known Allergies  Patient Measurements: Height: 6' (182.9 cm) Weight: 148 lb (67.132 kg) IBW/kg (Calculated) : 77.6 HEPARIN DW (KG): 67.1  Vital Signs: Temp: 97.8 F (36.6 C) (05/14 0622) Temp Source: Oral (05/14 0622) BP: 90/63 mmHg (05/14 0649) Pulse Rate: 87 (05/14 0649)  Labs:  Recent Labs  03/16/15 2030 03/16/15 2342 03/17/15 0605 03/17/15 1114 03/17/15 1911 03/18/15 0601  HGB 10.4*  --  8.7*  --   --  8.7*  HCT 30.0*  --  24.7*  --   --  24.2*  PLT 188  --  152  --   --  181  LABPROT 16.0*  --   --   --   --   --   INR 1.27  --   --   --   --   --   HEPARINUNFRC  --   --   --  <0.10* 0.35 0.54  CREATININE 0.65  --  0.78  --   --  0.51*  TROPONINI <0.03 <0.03 <0.03 <0.03  --   --     Estimated Creatinine Clearance: 100.2 mL/min (by C-G formula based on Cr of 0.51).   Medications:  Scheduled:  . aspirin EC  81 mg Oral Daily  . feeding supplement (ENSURE ENLIVE)  237 mL Oral BID BM  . levofloxacin (LEVAQUIN) IV  750 mg Intravenous Q24H  . methylPREDNISolone (SOLU-MEDROL) injection  125 mg Intravenous Q12H  .  morphine injection  1-2 mg Intravenous Once  . pneumococcal 23 valent vaccine  0.5 mL Intramuscular Tomorrow-1000  . sodium chloride  3 mL Intravenous Q12H   Assessment: Okay for Protocol.  H/H decreased (monitor).  CT(+) PE, await long term AC plan. Heparin level therapeutic last evening and this morning. No signs of bleeding  Goal of Therapy:  Heparin level 0.3-0.7 units/ml Monitor platelets by anticoagulation protocol: Yes   Plan:  Continue Heparin at 1200 units/hr Daily HL and CBC while on Heparin. F/U oral AC plan   Patrick Lee, Patrick Lee 03/18/2015,7:57 AM

## 2015-03-18 NOTE — Progress Notes (Signed)
ANTICOAGULATION CONSULT NOTE - Initial Consult  Pharmacy Consult for Lovenox Indication: pulmonary embolus  No Known Allergies  Patient Measurements: Height: 6' (182.9 cm) Weight: 148 lb (67.132 kg) IBW/kg (Calculated) : 77.6 Heparin Dosing Weight: 67.1 kg  Vital Signs: Temp: 98.2 F (36.8 C) (05/14 1348) Temp Source: Oral (05/14 1348) BP: 104/62 mmHg (05/14 1348) Pulse Rate: 84 (05/14 1348)  Labs:  Recent Labs  03/16/15 2030 03/16/15 2342 03/17/15 0605 03/17/15 1114 03/17/15 1911 03/18/15 0601  HGB 10.4*  --  8.7*  --   --  8.7*  HCT 30.0*  --  24.7*  --   --  24.2*  PLT 188  --  152  --   --  181  LABPROT 16.0*  --   --   --   --   --   INR 1.27  --   --   --   --   --   HEPARINUNFRC  --   --   --  <0.10* 0.35 0.54  CREATININE 0.65  --  0.78  --   --  0.51*  TROPONINI <0.03 <0.03 <0.03 <0.03  --   --     Estimated Creatinine Clearance: 100.2 mL/min (by C-G formula based on Cr of 0.51).   Medical History: Past Medical History  Diagnosis Date  . Lateral epicondylitis   . Chest pain, unspecified   . Abdominal pain, other specified site   . Obstruction of bile duct   . Tobacco use disorder   . Pancreatic cancer      status post Whipple procedure.  . Anxiety   . Shortness of breath     uses oxygen at night- 2 liters   . Myocardial infarction 2007    during [place]- 2007  . Complication of anesthesia     pt. reports that he had MI while under anesth. for Whipple procedure  . Lung cancer     Medications:  Scheduled:  . aspirin EC  81 mg Oral Daily  . enoxaparin (LOVENOX) injection  1.5 mg/kg Subcutaneous Q24H  . feeding supplement (ENSURE ENLIVE)  237 mL Oral BID BM  . levofloxacin (LEVAQUIN) IV  750 mg Intravenous Q24H  . methylPREDNISolone (SOLU-MEDROL) injection  125 mg Intravenous Q12H  .  morphine injection  1-2 mg Intravenous Once  . sodium chloride  3 mL Intravenous Q12H    Assessment: CT(+) PE , history of  cancer Patient has been on therapeutic heparin infusion, convert to enoxaparin  CrCl > 30 ml/min    Goal of Therapy:  Anti-XA level 1-2 units/ml, 4 hour after LMWH dose Monitor platelets by anticoagulation protocol: Yes   Plan: Discontinue heparin infusion  Lovenox 100 mg (1.5 mg/kg) sq every 24 hours, starting 1 hour after stopping heparin infusion Monitor CBC, platelets Monitor renal function Labs per protocol  Abner Greenspan, Daniele Yankowski Bennett 03/18/2015,2:47 PM

## 2015-03-18 NOTE — Progress Notes (Signed)
[facility] PROGRESS NOTE  Patrick Lee ALP:[id number] DOB: 1961/04/11 DOA: 03/16/2015 PCP: Anthoney Harada, MD  Assessment/Plan: 1. Acute on chronic hypoxic respiratory failure secondary to acute PE 1. Likely secondary to below LE DVT  2. PT has been started on therapeutic heparin 3. Given clot burden, had continued heparin 4. Given cancer hx, recommend prolonged lovenox. Pharmacy to dose. Patient is aware of this plan and agrees. 2. Possible bronchitis vs PNA 1. Pt was started on empiric levaquin 2. Given acuity of pt's illness, will cont for now 3. LE edema secondary to B LE DVT 1. B DVT noted on LE dopplers 2. Pt was continued on heparin, to transition to lovenox given cancer history 4. COPD 1. Stable 2. Pt is continued on duonebs and scheduled solumedrol 3. Cont to wean o2 as tolerated 4. Ambulate and document O2 sats 5. Tobacco abuse 1. Cessation done 2. Pt has hx of lung cancer 6. Macrocytic anemia 1. MCV of 105 2. Question D53 vs folic acid deficiency. Would check labs either as inpt or outpt  Code Status: Full Family Communication: Pt in room  Disposition Plan: Pending   Consultants:    Procedures:    Antibiotics:  Levaquin 5/13>>> (indicate start date, and stop date if known)  HPI/Subjective: Reports breathing better today  Objective: Filed Vitals:   03/18/15 0649 03/18/15 1005 03/18/15 1348 03/18/15 1508  BP: 90/63 88/56 104/62 92/60  Pulse: 87 85 84 82  Temp:  97.8 F (36.6 C) 98.2 F (36.8 C) 97.9 F (36.6 C)  TempSrc:  Oral Oral Oral  Resp:  '18 20 18  '$ Height:      Weight:      SpO2: 100% 99% 99% 99%    Intake/Output Summary (Last 24 hours) at 03/18/15 1706 Last data filed at 03/18/15 0849  Gross per 24 hour  Intake   1200 ml  Output      0 ml  Net   1200 ml   Filed Weights   03/16/15 1933 03/17/15 0015  Weight: 67.246 kg (148 lb 4 oz) 67.132 kg (148 lb)    Exam:   General:  Awake, in and, laying in  bed  Cardiovascular: regular, s1, s2  Respiratory: normal resp effort, decreased BS  Abdomen: soft, nondistended, pos bs  Musculoskeletal: perfused, no clubbing   Data Reviewed: Basic Metabolic Panel:  Recent Labs Lab 03/16/15 2030 03/17/15 0605 03/18/15 0601  NA 135 134* 137  K 3.6 3.6 3.5  CL 107 105 109  CO2 '22 23 24  '$ GLUCOSE 106* 194* 195*  BUN '8 7 8  '$ CREATININE 0.65 0.78 0.51*  CALCIUM 7.3* 7.1* 7.7*   Liver Function Tests:  Recent Labs Lab 03/17/15 0605 03/18/15 0601  AST 33 20  ALT 14* 14*  ALKPHOS 77 76  BILITOT 0.6 0.5  PROT 4.1* 3.9*  ALBUMIN 1.4* 1.4*   No results for input(s): LIPASE, AMYLASE in the last 168 hours. No results for input(s): AMMONIA in the last 168 hours. CBC:  Recent Labs Lab 03/16/15 2030 03/17/15 0605 03/18/15 0601  WBC 12.4* 10.9* 19.4*  NEUTROABS  --  10.0* 17.6*  HGB 10.4* 8.7* 8.7*  HCT 30.0* 24.7* 24.2*  MCV 106.0* 105.6* 105.7*  PLT 188 152 181   Cardiac Enzymes:  Recent Labs Lab 03/16/15 2030 03/16/15 2342 03/17/15 0605 03/17/15 1114  TROPONINI <0.03 <0.03 <0.03 <0.03   BNP (last 3 results)  Recent Labs  03/16/15 2030  BNP 68.0    ProBNP (last 3  results) No results for input(s): PROBNP in the last 8760 hours.  CBG: No results for input(s): GLUCAP in the last 168 hours.  Recent Results (from the past 240 hour(s))  Culture, blood (routine x 2) Call MD if unable to obtain prior to antibiotics being given     Status: None (Preliminary result)   Collection Time: 03/16/15 11:42 PM  Result Value Ref Range Status   Specimen Description BLOOD RIGHT ANTECUBITAL  Final   Special Requests BOTTLES DRAWN AEROBIC AND ANAEROBIC 11CC EACH  Final   Culture NO GROWTH 2 DAYS  Final   Report Status PENDING  Incomplete  Culture, blood (routine x 2) Call MD if unable to obtain prior to antibiotics being given     Status: None (Preliminary result)   Collection Time: 03/16/15 11:53 PM  Result Value Ref Range Status    Specimen Description BLOOD LEFT HAND  Final   Special Requests BOTTLES DRAWN AEROBIC AND ANAEROBIC 5CC EACH  Final   Culture NO GROWTH 2 DAYS  Final   Report Status PENDING  Incomplete  Clostridium Difficile by PCR     Status: None   Collection Time: 03/17/15 11:05 AM  Result Value Ref Range Status   C difficile by pcr NEGATIVE NEGATIVE Final     Studies: Dg Chest 2 View (if Patient Has Fever And/or Copd)  03/16/2015   CLINICAL DATA:  Increasing shortness of breath since Friday. Swelling in the legs and abdomen. Uses oxygen. History of lung cancer. Ex-smoker for 3 years.  EXAM: CHEST  2 VIEW  COMPARISON:  08/01/2014  FINDINGS: Pulmonary hyperinflation. Linear atelectasis or infiltration in the lung bases. No focal airspace disease or consolidation. Blunting of the costophrenic angles suggests small effusions. Heart size and pulmonary vascularity are normal.  IMPRESSION: Small bilateral pleural effusions. Linear atelectasis or infiltration in the lung bases. Emphysematous changes.   Electronically Signed   By: Lucienne Capers M.D.   On: 03/16/2015 21:15   Ct Angio Chest Pe W/cm &/or Wo Cm  03/17/2015   CLINICAL DATA:  History of lung cancer post lobectomy, pancreatic cancer post Whipple procedure, chronic respiratory failure. Now presents with acute on chronic respiratory failure with hypoxia. Scrotal swelling.  EXAM: CT ANGIOGRAPHY CHEST  CT ABDOMEN AND PELVIS WITH CONTRAST  TECHNIQUE: Multidetector CT imaging of the chest was performed using the standard protocol during bolus administration of intravenous contrast. Multiplanar CT image reconstructions and MIPs were obtained to evaluate the vascular anatomy. Multidetector CT imaging of the abdomen and pelvis was performed using the standard protocol during bolus administration of intravenous contrast.  CONTRAST:  123m OMNIPAQUE IOHEXOL 350 MG/ML SOLN  COMPARISON:  CT abdomen and pelvis 08/01/2014  FINDINGS: CTA CHEST FINDINGS  Technically  adequate study with good opacification of the central and segmental pulmonary arteries. Filling defects are demonstrated in bilateral lower lobe pulmonary arteries and in the right upper lung pulmonary artery consistent with acute pulmonary embolus. RV to LV ratio is normal. No evidence of right heart strain.  Normal heart size. Normal caliber thoracic aorta. No evidence of aortic dissection. Great vessel origins are patent. No significant lymphadenopathy in the chest. Esophagus is decompressed.  Moderate right and small left pleural effusions. Atelectasis or infiltration in the lung bases. Patchy nodular airspace disease in both lungs is likely to represent inflammatory process. Airways appear patent. No pneumothorax. Emphysematous changes in the upper lungs.  Mild degenerative changes in the spine. No destructive bone lesions.  CT ABDOMEN and PELVIS FINDINGS  Surgical absence of the gallbladder. Surgical absence of the head and body of the pancreas. Pancreatic tail appears intact. No pancreatic ductal dilatation. No bile duct dilatation. Diffuse fatty atrophy of the liver. Spleen, adrenal glands, kidneys, inferior vena cava, and retroperitoneal lymph nodes are unremarkable. Calcification and atherosclerotic changes in the abdominal aorta. No aneurysm. Stomach, small bowel, and colon are not abnormally distended and contrast material flows through to the rectum without evidence of small bowel or colonic obstruction. However, there is diffuse wall thickening throughout the small bowel and in the colon which may indicate enteritis and colitis. The superior mesenteric artery and vein are patent. No pneumatosis. No free air.  Pelvis: Appendix is not identified. Free fluid in the pelvis extending down from the abdomen consistent with ascites. Bladder wall is mildly thickened which may indicate cystitis. Prostate gland is enlarged. No pelvic lymphadenopathy. Degenerative changes in the spine and hips. No destructive bone  lesions.  Review of the MIP images confirms the above findings.  IMPRESSION: Positive study for bilateral pulmonary emboli. No right heart strain. Bilateral pleural effusions with basilar atelectasis. Patchy nodular airspace disease in both lungs is likely inflammatory. Consider follow-up in 3 months to exclude underlying pulmonary nodules.  Diffuse abdominal and pelvic ascites. Diffuse small and large bowel wall thickening suggesting enteritis and colitis. Bladder wall is thickened which may indicate cystitis. Diffuse fatty infiltration of the liver.  These results were called by telephone at the time of interpretation on 03/17/2015 at 2:17 am to Dr. Shanda Howells , who verbally acknowledged these results.   Electronically Signed   By: Lucienne Capers M.D.   On: 03/17/2015 02:21   US Scrotum  03/17/2015   CLINICAL DATA:  Scrotal swelling.  Pancreatic cancer and lung cancer  EXAM: SCROTAL ULTRASOUND  DOPPLER ULTRASOUND OF THE TESTICLES  TECHNIQUE: Complete ultrasound examination of the testicles, epididymis, and other scrotal structures was performed. Color and spectral Doppler ultrasound were also utilized to evaluate blood flow to the testicles.  COMPARISON:  None.  FINDINGS: Right testicle  Measurements: 4.2 x 1.8 x 3.2 cm. No mass or microlithiasis visualized.  Left testicle  Measurements: 3.9 x 2.1 x 2.9 cm. 2 mm cyst superior pole. No mass or microlithiasis visualized.  Right epididymis: Multiple small cysts in the epididymis. Normal vascularity by Doppler.  Left epididymis: Small cysts in the epididymis. Normal vascularity by Doppler.  Hydrocele:  Small bilateral hydrocele  Varicocele:  None visualized.  Pulsed Doppler interrogation of both testes demonstrates normal low resistance arterial and venous waveforms bilaterally.  IMPRESSION: Negative for testicular torsion  Negative for testicular mass  No areas of increased vascularity are noted. Small bilateral hydrocele.   Electronically Signed   By: Franchot Gallo M.D.   On: 03/17/2015 10:12   Ct Abdomen Pelvis W Contrast  03/17/2015   CLINICAL DATA:  History of lung cancer post lobectomy, pancreatic cancer post Whipple procedure, chronic respiratory failure. Now presents with acute on chronic respiratory failure with hypoxia. Scrotal swelling.  EXAM: CT ANGIOGRAPHY CHEST  CT ABDOMEN AND PELVIS WITH CONTRAST  TECHNIQUE: Multidetector CT imaging of the chest was performed using the standard protocol during bolus administration of intravenous contrast. Multiplanar CT image reconstructions and MIPs were obtained to evaluate the vascular anatomy. Multidetector CT imaging of the abdomen and pelvis was performed using the standard protocol during bolus administration of intravenous contrast.  CONTRAST:  176m OMNIPAQUE IOHEXOL 350 MG/ML SOLN  COMPARISON:  CT abdomen and pelvis 08/01/2014  FINDINGS: CTA  CHEST FINDINGS  Technically adequate study with good opacification of the central and segmental pulmonary arteries. Filling defects are demonstrated in bilateral lower lobe pulmonary arteries and in the right upper lung pulmonary artery consistent with acute pulmonary embolus. RV to LV ratio is normal. No evidence of right heart strain.  Normal heart size. Normal caliber thoracic aorta. No evidence of aortic dissection. Great vessel origins are patent. No significant lymphadenopathy in the chest. Esophagus is decompressed.  Moderate right and small left pleural effusions. Atelectasis or infiltration in the lung bases. Patchy nodular airspace disease in both lungs is likely to represent inflammatory process. Airways appear patent. No pneumothorax. Emphysematous changes in the upper lungs.  Mild degenerative changes in the spine. No destructive bone lesions.  CT ABDOMEN and PELVIS FINDINGS  Surgical absence of the gallbladder. Surgical absence of the head and body of the pancreas. Pancreatic tail appears intact. No pancreatic ductal dilatation. No bile duct dilatation.  Diffuse fatty atrophy of the liver. Spleen, adrenal glands, kidneys, inferior vena cava, and retroperitoneal lymph nodes are unremarkable. Calcification and atherosclerotic changes in the abdominal aorta. No aneurysm. Stomach, small bowel, and colon are not abnormally distended and contrast material flows through to the rectum without evidence of small bowel or colonic obstruction. However, there is diffuse wall thickening throughout the small bowel and in the colon which may indicate enteritis and colitis. The superior mesenteric artery and vein are patent. No pneumatosis. No free air.  Pelvis: Appendix is not identified. Free fluid in the pelvis extending down from the abdomen consistent with ascites. Bladder wall is mildly thickened which may indicate cystitis. Prostate gland is enlarged. No pelvic lymphadenopathy. Degenerative changes in the spine and hips. No destructive bone lesions.  Review of the MIP images confirms the above findings.  IMPRESSION: Positive study for bilateral pulmonary emboli. No right heart strain. Bilateral pleural effusions with basilar atelectasis. Patchy nodular airspace disease in both lungs is likely inflammatory. Consider follow-up in 3 months to exclude underlying pulmonary nodules.  Diffuse abdominal and pelvic ascites. Diffuse small and large bowel wall thickening suggesting enteritis and colitis. Bladder wall is thickened which may indicate cystitis. Diffuse fatty infiltration of the liver.  These results were called by telephone at the time of interpretation on 03/17/2015 at 2:17 am to Dr. Shanda Howells , who verbally acknowledged these results.   Electronically Signed   By: Lucienne Capers M.D.   On: 03/17/2015 02:21   US Venous Img Lower Bilateral  03/17/2015   CLINICAL DATA:  Bilateral leg swelling, 2-3 days duration. Recent diagnosis of pulmonary emboli.  EXAM: BILATERAL LOWER EXTREMITY VENOUS DOPPLER ULTRASOUND  TECHNIQUE: Gray-scale sonography with graded compression,  as well as color Doppler and duplex ultrasound were performed to evaluate the lower extremity deep venous systems from the level of the common femoral vein and including the common femoral, femoral, profunda femoral, popliteal and calf veins including the posterior tibial, peroneal and gastrocnemius veins when visible. The superficial great saphenous vein was also interrogated. Spectral Doppler was utilized to evaluate flow at rest and with distal augmentation maneuvers in the common femoral, femoral and popliteal veins.  COMPARISON:  None.  FINDINGS: RIGHT LOWER EXTREMITY  Common Femoral Vein: Occlusive thrombus at this level.  Saphenofemoral Junction: No evidence of thrombus. Normal compressibility and flow on color Doppler imaging.  Profunda Femoral Vein: Occlusive thrombus at this level.  Femoral Vein: No evidence of thrombus. Normal compressibility, respiratory phasicity and response to augmentation.  Popliteal Vein: No evidence of  thrombus. Normal compressibility, respiratory phasicity and response to augmentation.  Calf Veins: No evidence of thrombus. Normal compressibility and flow on color Doppler imaging.  Superficial Great Saphenous Vein: No evidence of thrombus. Normal compressibility and flow on color Doppler imaging.  Venous Reflux:  None.  Other Findings:  None.  LEFT LOWER EXTREMITY  Common Femoral Vein: No evidence of thrombus. Normal compressibility, respiratory phasicity and response to augmentation.  Saphenofemoral Junction: No evidence of thrombus. Normal compressibility and flow on color Doppler imaging.  Profunda Femoral Vein: No evidence of thrombus. Normal compressibility and flow on color Doppler imaging.  Femoral Vein: No evidence of thrombus. Normal compressibility, respiratory phasicity and response to augmentation.  Popliteal Vein: Occlusive thrombus at this level.  Calf Veins: No evidence of thrombus. Normal compressibility and flow on color Doppler imaging.  Superficial Great  Saphenous Vein: No evidence of thrombus. Normal compressibility and flow on color Doppler imaging.  Venous Reflux:  None.  Other Findings:  None.  IMPRESSION: Study is positive for deep venous thrombosis on the right affecting the common femoral vein an the profunda femoral vein. The study is positive for deep venous thrombosis on the left affecting the popliteal vein.   Electronically Signed   By: Nelson Chimes M.D.   On: 03/17/2015 09:57   [place] Art/ven Flow Abd Pelv Doppler  03/17/2015   CLINICAL DATA:  Scrotal swelling.  Pancreatic cancer and lung cancer  EXAM: SCROTAL ULTRASOUND  DOPPLER ULTRASOUND OF THE TESTICLES  TECHNIQUE: Complete ultrasound examination of the testicles, epididymis, and other scrotal structures was performed. Color and spectral Doppler ultrasound were also utilized to evaluate blood flow to the testicles.  COMPARISON:  None.  FINDINGS: Right testicle  Measurements: 4.2 x 1.8 x 3.2 cm. No mass or microlithiasis visualized.  Left testicle  Measurements: 3.9 x 2.1 x 2.9 cm. 2 mm cyst superior pole. No mass or microlithiasis visualized.  Right epididymis: Multiple small cysts in the epididymis. Normal vascularity by Doppler.  Left epididymis: Small cysts in the epididymis. Normal vascularity by Doppler.  Hydrocele:  Small bilateral hydrocele  Varicocele:  None visualized.  Pulsed Doppler interrogation of both testes demonstrates normal low resistance arterial and venous waveforms bilaterally.  IMPRESSION: Negative for testicular torsion  Negative for testicular mass  No areas of increased vascularity are noted. Small bilateral hydrocele.   Electronically Signed   By: Franchot Gallo M.D.   On: 03/17/2015 10:12    Scheduled Meds: . aspirin EC  81 mg Oral Daily  . enoxaparin (LOVENOX) injection  1.5 mg/kg Subcutaneous Q24H  . feeding supplement (ENSURE ENLIVE)  237 mL Oral BID BM  . levofloxacin (LEVAQUIN) IV  750 mg Intravenous Q24H  . methylPREDNISolone (SOLU-MEDROL) injection  125 mg  Intravenous Q12H  .  morphine injection  1-2 mg Intravenous Once  . sodium chloride  3 mL Intravenous Q12H   Continuous Infusions:    Active Problems:   Acute on chronic respiratory failure with hypoxia   CHIU, STEPHEN K  [facility] Pager [phone]. If 7PM-7AM, please contact night-coverage at [email or web address], password [facility] 03/18/2015, 5:06 PM  LOS: 2 days

## 2015-03-18 NOTE — Progress Notes (Signed)
D-  BP 71/44 in left arm and 82/57 in right arm and patient is asymptomatic. Heart rate remains SR 70-80's.  Recheck of BP 90/63 in right arm after sitting patient up in bed.  A- Dr. Ernestina Patches notified via phone and he requested I notify the oncoming MD since patient was asymptomatic and unchanged clinically.  Dr. Wyline Copas notified via phone and no new orders were given at this time.  R-  Patient in bed and denies complaint at this time.  Nursing staff to continue to monitor.

## 2015-03-19 DIAGNOSIS — I82403 Acute embolism and thrombosis of unspecified deep veins of lower extremity, bilateral: Secondary | ICD-10-CM

## 2015-03-19 DIAGNOSIS — I2699 Other pulmonary embolism without acute cor pulmonale: Secondary | ICD-10-CM

## 2015-03-19 DIAGNOSIS — Z7901 Long term (current) use of anticoagulants: Secondary | ICD-10-CM

## 2015-03-19 LAB — COMPREHENSIVE METABOLIC PANEL
ALT: 18 U/L (ref 17–63)
AST: 25 U/L (ref 15–41)
Albumin: 1.4 g/dL — ABNORMAL LOW (ref 3.5–5.0)
Alkaline Phosphatase: 70 U/L (ref 38–126)
Anion gap: 4 — ABNORMAL LOW (ref 5–15)
BUN: 12 mg/dL (ref 6–20)
CO2: 24 mmol/L (ref 22–32)
CREATININE: 0.54 mg/dL — AB (ref 0.61–1.24)
Calcium: 7.6 mg/dL — ABNORMAL LOW (ref 8.9–10.3)
Chloride: 110 mmol/L (ref 101–111)
GLUCOSE: 132 mg/dL — AB (ref 65–99)
Potassium: 3.9 mmol/L (ref 3.5–5.1)
Sodium: 138 mmol/L (ref 135–145)
Total Bilirubin: 0.4 mg/dL (ref 0.3–1.2)
Total Protein: 4 g/dL — ABNORMAL LOW (ref 6.5–8.1)

## 2015-03-19 LAB — CBC WITH DIFFERENTIAL/PLATELET
Basophils Absolute: 0 10*3/uL (ref 0.0–0.1)
Basophils Relative: 0 % (ref 0–1)
EOS ABS: 0 10*3/uL (ref 0.0–0.7)
Eosinophils Relative: 0 % (ref 0–5)
HCT: 24.4 % — ABNORMAL LOW (ref 39.0–52.0)
Hemoglobin: 8.4 g/dL — ABNORMAL LOW (ref 13.0–17.0)
LYMPHS ABS: 0.6 10*3/uL — AB (ref 0.7–4.0)
Lymphocytes Relative: 3 % — ABNORMAL LOW (ref 12–46)
MCH: 36.7 pg — ABNORMAL HIGH (ref 26.0–34.0)
MCHC: 34.4 g/dL (ref 30.0–36.0)
MCV: 106.6 fL — ABNORMAL HIGH (ref 78.0–100.0)
Monocytes Absolute: 0.6 10*3/uL (ref 0.1–1.0)
Monocytes Relative: 3 % (ref 3–12)
NEUTROS PCT: 94 % — AB (ref 43–77)
Neutro Abs: 21.3 10*3/uL — ABNORMAL HIGH (ref 1.7–7.7)
Platelets: 198 10*3/uL (ref 150–400)
RBC: 2.29 MIL/uL — ABNORMAL LOW (ref 4.22–5.81)
RDW: 15.4 % (ref 11.5–15.5)
WBC: 22.5 10*3/uL — AB (ref 4.0–10.5)

## 2015-03-19 LAB — STREP PNEUMONIAE URINARY ANTIGEN: STREP PNEUMO URINARY ANTIGEN: NEGATIVE

## 2015-03-19 MED ORDER — ENOXAPARIN SODIUM 100 MG/ML ~~LOC~~ SOLN: 100.0000 mg | SUBCUTANEOUS | Status: DC

## 2015-03-19 MED ORDER — LEVOFLOXACIN 750 MG PO TABS: 750.0000 mg | ORAL_TABLET | Freq: Every day | ORAL | Status: DC

## 2015-03-19 MED ORDER — PREDNISONE 20 MG PO TABS
40.0000 mg | ORAL_TABLET | Freq: Every day | ORAL | Status: DC
  Administered 2015-03-19: 40 mg via ORAL
  Filled 2015-03-19: qty 2

## 2015-03-19 MED ORDER — ENOXAPARIN SODIUM 150 MG/ML ~~LOC~~ SOLN: 100.0000 mg | SUBCUTANEOUS | Status: DC

## 2015-03-19 MED ORDER — PREDNISONE 5 MG PO TABS: 5.0000 mg | ORAL_TABLET | Freq: Every day | ORAL | Status: DC

## 2015-03-19 NOTE — Progress Notes (Signed)
PHARMACIST - PHYSICIAN COMMUNICATION DR:    CONCERNING: Antibiotic IV to Oral Route Change Policy  RECOMMENDATION: This patient is receiving Levaquin by the intravenous route.  Based on criteria approved by the Pharmacy and Therapeutics Committee, the antibiotic(s) is/are being converted to the equivalent oral dose form(s).   DESCRIPTION: These criteria include:  Patient being treated for a respiratory tract infection, urinary tract infection, cellulitis or clostridium difficile associated diarrhea if on metronidazole  The patient is not neutropenic and does not exhibit a GI malabsorption state  The patient is eating (either orally or via tube) and/or has been taking other orally administered medications for a least 24 hours  The patient is improving clinically and has a Tmax < 100.5  If you have questions about this conversion, please contact the Pharmacy Department  '[x]'$   [phone] )  [facility] '[]'$   [phone] )  [facility] '[]'$   [phone] )  [facility] '[]'$   [phone] )  [facility] '[]'$   [phone] )  [facility]

## 2015-03-19 NOTE — Discharge Summary (Signed)
Physician Discharge Summary  Patrick Lee WGN:[id number] DOB: Jul 03, 1961 DOA: 03/16/2015  PCP: Anthoney Harada, MD  Admit date: 03/16/2015 Discharge date: 03/19/2015  Time spent: 20 minutes  Recommendations for Outpatient Follow-up:  1. Follow up with PCP in 1-2 weeks 2. Would repeat CBC within 1-2 weeks  Discharge Diagnoses:  Principal Problem:   Acute on chronic respiratory failure with hypoxia Active Problems:   Tobacco use disorder   Acute deep vein thrombosis of both lower extremities   Acute pulmonary embolism   Long term current use of anticoagulant therapy   Discharge Condition: Stable  Diet recommendation: Heart healthy  Filed Weights   03/16/15 1933 03/17/15 0015  Weight: 67.246 kg (148 lb 4 oz) 67.132 kg (148 lb)    History of present illness:  Please see admit h and p from 5/12 for details. Briefly, pt presented with worsening sob and complaints of lower extremity edema. The patient was admitted for further work up.  Hospital Course:  1. Acute on chronic hypoxic respiratory failure secondary to acute PE 1. Likely secondary to below LE DVT in the setting of cancer hx 2. PT has been started on therapeutic anticoagulation 3. Given clot burden, had initially continued heparin 4. Given cancer hx, will recommend prolonged lovenox. Patient is aware of this plan and agrees. 2. Possible bronchitis vs PNA 1. Pt was started on empiric levaquin 2. Given acuity of pt's illness, will cont for now 3. LE edema secondary to B LE DVT 1. B DVT noted on LE dopplers 2. Pt was continued on heparin, transitioned to lovenox given cancer history 4. COPD 1. Stable 2. Pt was continued on duonebs and scheduled solumedrol 3. Successfully weaned o2 to RA and patient ambulated in hallway without significant drop in o2 sat 5. Tobacco abuse 1. Cessation done at bedside 2. Pt has hx of lung cancer 6. Macrocytic anemia 1. MCV of 105 2. Question H84 vs folic acid deficiency. Would  check labs as outpt 7. Leukocytosis 1. Remained afebrile. Patient does not appear toxic and in fact reports feeling much improved by the day of discharge 2. Suspect elevated WBC is secondary to high dosed steroids.  3. Pt knows this, elects to go home with outpatient follow up 4. Would repeat CBC in 1-2 weeks  Discharge Exam: Filed Vitals:   03/19/15 0603 03/19/15 0604 03/19/15 0940 03/19/15 0943  BP: 79/39 90/60    Pulse: 79 86 136 97  Temp: 98 F (36.7 C)     TempSrc: Oral     Resp: 18     Height:      Weight:      SpO2: 100%  98% 99%    General: Awake, in nad Cardiovascular: regular, s1, s2 Respiratory: normal resp effort, no wheezing  Discharge Instructions     Medication List    STOP taking these medications        ondansetron 4 MG tablet  Commonly known as:  ZOFRAN     oxyCODONE-acetaminophen 5-325 MG per tablet  Commonly known as:  PERCOCET/ROXICET      TAKE these medications        aspirin EC 81 MG tablet  Take 81 mg by mouth daily.     enoxaparin 100 MG/ML injection  Commonly known as:  LOVENOX  Inject 1 mL (100 mg total) into the skin daily.     levofloxacin 750 MG tablet  Commonly known as:  LEVAQUIN  Take 1 tablet (750 mg total) by mouth daily at 6  PM.     OXYGEN  Inhale 2 L into the lungs daily.     predniSONE 5 MG tablet  Commonly known as:  DELTASONE  Take 1 tablet (5 mg total) by mouth daily with breakfast.       No Known Allergies Follow-up Information    Follow up with [name] On 03/31/2015.   Why:  at 1:45   Contact information:   [place] [id number] [phone]        The results of significant diagnostics from this hospitalization (including imaging, microbiology, ancillary and laboratory) are listed below for reference.    Significant Diagnostic Studies: Dg Chest 2 View (if Patient Has Fever And/or Copd)  03/16/2015   CLINICAL DATA:  Increasing shortness of breath since Friday.  Swelling in the legs and abdomen. Uses oxygen. History of lung cancer. Ex-smoker for 3 years.  EXAM: CHEST  2 VIEW  COMPARISON:  08/01/2014  FINDINGS: Pulmonary hyperinflation. Linear atelectasis or infiltration in the lung bases. No focal airspace disease or consolidation. Blunting of the costophrenic angles suggests small effusions. Heart size and pulmonary vascularity are normal.  IMPRESSION: Small bilateral pleural effusions. Linear atelectasis or infiltration in the lung bases. Emphysematous changes.   Electronically Signed   By: Lucienne Capers M.D.   On: 03/16/2015 21:15   Ct Angio Chest Pe W/cm &/or Wo Cm  03/17/2015   CLINICAL DATA:  History of lung cancer post lobectomy, pancreatic cancer post Whipple procedure, chronic respiratory failure. Now presents with acute on chronic respiratory failure with hypoxia. Scrotal swelling.  EXAM: CT ANGIOGRAPHY CHEST  CT ABDOMEN AND PELVIS WITH CONTRAST  TECHNIQUE: Multidetector CT imaging of the chest was performed using the standard protocol during bolus administration of intravenous contrast. Multiplanar CT image reconstructions and MIPs were obtained to evaluate the vascular anatomy. Multidetector CT imaging of the abdomen and pelvis was performed using the standard protocol during bolus administration of intravenous contrast.  CONTRAST:  135m OMNIPAQUE IOHEXOL 350 MG/ML SOLN  COMPARISON:  CT abdomen and pelvis 08/01/2014  FINDINGS: CTA CHEST FINDINGS  Technically adequate study with good opacification of the central and segmental pulmonary arteries. Filling defects are demonstrated in bilateral lower lobe pulmonary arteries and in the right upper lung pulmonary artery consistent with acute pulmonary embolus. RV to LV ratio is normal. No evidence of right heart strain.  Normal heart size. Normal caliber thoracic aorta. No evidence of aortic dissection. Great vessel origins are patent. No significant lymphadenopathy in the chest. Esophagus is decompressed.   Moderate right and small left pleural effusions. Atelectasis or infiltration in the lung bases. Patchy nodular airspace disease in both lungs is likely to represent inflammatory process. Airways appear patent. No pneumothorax. Emphysematous changes in the upper lungs.  Mild degenerative changes in the spine. No destructive bone lesions.  CT ABDOMEN and PELVIS FINDINGS  Surgical absence of the gallbladder. Surgical absence of the head and body of the pancreas. Pancreatic tail appears intact. No pancreatic ductal dilatation. No bile duct dilatation. Diffuse fatty atrophy of the liver. Spleen, adrenal glands, kidneys, inferior vena cava, and retroperitoneal lymph nodes are unremarkable. Calcification and atherosclerotic changes in the abdominal aorta. No aneurysm. Stomach, small bowel, and colon are not abnormally distended and contrast material flows through to the rectum without evidence of small bowel or colonic obstruction. However, there is diffuse wall thickening throughout the small bowel and in the colon which may indicate enteritis and colitis. The superior mesenteric artery and  vein are patent. No pneumatosis. No free air.  Pelvis: Appendix is not identified. Free fluid in the pelvis extending down from the abdomen consistent with ascites. Bladder wall is mildly thickened which may indicate cystitis. Prostate gland is enlarged. No pelvic lymphadenopathy. Degenerative changes in the spine and hips. No destructive bone lesions.  Review of the MIP images confirms the above findings.  IMPRESSION: Positive study for bilateral pulmonary emboli. No right heart strain. Bilateral pleural effusions with basilar atelectasis. Patchy nodular airspace disease in both lungs is likely inflammatory. Consider follow-up in 3 months to exclude underlying pulmonary nodules.  Diffuse abdominal and pelvic ascites. Diffuse small and large bowel wall thickening suggesting enteritis and colitis. Bladder wall is thickened which may  indicate cystitis. Diffuse fatty infiltration of the liver.  These results were called by telephone at the time of interpretation on 03/17/2015 at 2:17 am to Dr. Shanda Howells , who verbally acknowledged these results.   Electronically Signed   By: Lucienne Capers M.D.   On: 03/17/2015 02:21   US Scrotum  03/17/2015   CLINICAL DATA:  Scrotal swelling.  Pancreatic cancer and lung cancer  EXAM: SCROTAL ULTRASOUND  DOPPLER ULTRASOUND OF THE TESTICLES  TECHNIQUE: Complete ultrasound examination of the testicles, epididymis, and other scrotal structures was performed. Color and spectral Doppler ultrasound were also utilized to evaluate blood flow to the testicles.  COMPARISON:  None.  FINDINGS: Right testicle  Measurements: 4.2 x 1.8 x 3.2 cm. No mass or microlithiasis visualized.  Left testicle  Measurements: 3.9 x 2.1 x 2.9 cm. 2 mm cyst superior pole. No mass or microlithiasis visualized.  Right epididymis: Multiple small cysts in the epididymis. Normal vascularity by Doppler.  Left epididymis: Small cysts in the epididymis. Normal vascularity by Doppler.  Hydrocele:  Small bilateral hydrocele  Varicocele:  None visualized.  Pulsed Doppler interrogation of both testes demonstrates normal low resistance arterial and venous waveforms bilaterally.  IMPRESSION: Negative for testicular torsion  Negative for testicular mass  No areas of increased vascularity are noted. Small bilateral hydrocele.   Electronically Signed   By: Franchot Gallo M.D.   On: 03/17/2015 10:12   Ct Abdomen Pelvis W Contrast  03/17/2015   CLINICAL DATA:  History of lung cancer post lobectomy, pancreatic cancer post Whipple procedure, chronic respiratory failure. Now presents with acute on chronic respiratory failure with hypoxia. Scrotal swelling.  EXAM: CT ANGIOGRAPHY CHEST  CT ABDOMEN AND PELVIS WITH CONTRAST  TECHNIQUE: Multidetector CT imaging of the chest was performed using the standard protocol during bolus administration of intravenous  contrast. Multiplanar CT image reconstructions and MIPs were obtained to evaluate the vascular anatomy. Multidetector CT imaging of the abdomen and pelvis was performed using the standard protocol during bolus administration of intravenous contrast.  CONTRAST:  19m OMNIPAQUE IOHEXOL 350 MG/ML SOLN  COMPARISON:  CT abdomen and pelvis 08/01/2014  FINDINGS: CTA CHEST FINDINGS  Technically adequate study with good opacification of the central and segmental pulmonary arteries. Filling defects are demonstrated in bilateral lower lobe pulmonary arteries and in the right upper lung pulmonary artery consistent with acute pulmonary embolus. RV to LV ratio is normal. No evidence of right heart strain.  Normal heart size. Normal caliber thoracic aorta. No evidence of aortic dissection. Great vessel origins are patent. No significant lymphadenopathy in the chest. Esophagus is decompressed.  Moderate right and small left pleural effusions. Atelectasis or infiltration in the lung bases. Patchy nodular airspace disease in both lungs is likely to represent inflammatory process.  Airways appear patent. No pneumothorax. Emphysematous changes in the upper lungs.  Mild degenerative changes in the spine. No destructive bone lesions.  CT ABDOMEN and PELVIS FINDINGS  Surgical absence of the gallbladder. Surgical absence of the head and body of the pancreas. Pancreatic tail appears intact. No pancreatic ductal dilatation. No bile duct dilatation. Diffuse fatty atrophy of the liver. Spleen, adrenal glands, kidneys, inferior vena cava, and retroperitoneal lymph nodes are unremarkable. Calcification and atherosclerotic changes in the abdominal aorta. No aneurysm. Stomach, small bowel, and colon are not abnormally distended and contrast material flows through to the rectum without evidence of small bowel or colonic obstruction. However, there is diffuse wall thickening throughout the small bowel and in the colon which may indicate enteritis  and colitis. The superior mesenteric artery and vein are patent. No pneumatosis. No free air.  Pelvis: Appendix is not identified. Free fluid in the pelvis extending down from the abdomen consistent with ascites. Bladder wall is mildly thickened which may indicate cystitis. Prostate gland is enlarged. No pelvic lymphadenopathy. Degenerative changes in the spine and hips. No destructive bone lesions.  Review of the MIP images confirms the above findings.  IMPRESSION: Positive study for bilateral pulmonary emboli. No right heart strain. Bilateral pleural effusions with basilar atelectasis. Patchy nodular airspace disease in both lungs is likely inflammatory. Consider follow-up in 3 months to exclude underlying pulmonary nodules.  Diffuse abdominal and pelvic ascites. Diffuse small and large bowel wall thickening suggesting enteritis and colitis. Bladder wall is thickened which may indicate cystitis. Diffuse fatty infiltration of the liver.  These results were called by telephone at the time of interpretation on 03/17/2015 at 2:17 am to Dr. Shanda Howells , who verbally acknowledged these results.   Electronically Signed   By: Lucienne Capers M.D.   On: 03/17/2015 02:21   US Venous Img Lower Bilateral  03/17/2015   CLINICAL DATA:  Bilateral leg swelling, 2-3 days duration. Recent diagnosis of pulmonary emboli.  EXAM: BILATERAL LOWER EXTREMITY VENOUS DOPPLER ULTRASOUND  TECHNIQUE: Gray-scale sonography with graded compression, as well as color Doppler and duplex ultrasound were performed to evaluate the lower extremity deep venous systems from the level of the common femoral vein and including the common femoral, femoral, profunda femoral, popliteal and calf veins including the posterior tibial, peroneal and gastrocnemius veins when visible. The superficial great saphenous vein was also interrogated. Spectral Doppler was utilized to evaluate flow at rest and with distal augmentation maneuvers in the common femoral,  femoral and popliteal veins.  COMPARISON:  None.  FINDINGS: RIGHT LOWER EXTREMITY  Common Femoral Vein: Occlusive thrombus at this level.  Saphenofemoral Junction: No evidence of thrombus. Normal compressibility and flow on color Doppler imaging.  Profunda Femoral Vein: Occlusive thrombus at this level.  Femoral Vein: No evidence of thrombus. Normal compressibility, respiratory phasicity and response to augmentation.  Popliteal Vein: No evidence of thrombus. Normal compressibility, respiratory phasicity and response to augmentation.  Calf Veins: No evidence of thrombus. Normal compressibility and flow on color Doppler imaging.  Superficial Great Saphenous Vein: No evidence of thrombus. Normal compressibility and flow on color Doppler imaging.  Venous Reflux:  None.  Other Findings:  None.  LEFT LOWER EXTREMITY  Common Femoral Vein: No evidence of thrombus. Normal compressibility, respiratory phasicity and response to augmentation.  Saphenofemoral Junction: No evidence of thrombus. Normal compressibility and flow on color Doppler imaging.  Profunda Femoral Vein: No evidence of thrombus. Normal compressibility and flow on color Doppler imaging.  Femoral Vein: No  evidence of thrombus. Normal compressibility, respiratory phasicity and response to augmentation.  Popliteal Vein: Occlusive thrombus at this level.  Calf Veins: No evidence of thrombus. Normal compressibility and flow on color Doppler imaging.  Superficial Great Saphenous Vein: No evidence of thrombus. Normal compressibility and flow on color Doppler imaging.  Venous Reflux:  None.  Other Findings:  None.  IMPRESSION: Study is positive for deep venous thrombosis on the right affecting the common femoral vein an the profunda femoral vein. The study is positive for deep venous thrombosis on the left affecting the popliteal vein.   Electronically Signed   By: Nelson Chimes M.D.   On: 03/17/2015 09:57   [place] Art/ven Flow Abd Pelv Doppler  03/17/2015   CLINICAL  DATA:  Scrotal swelling.  Pancreatic cancer and lung cancer  EXAM: SCROTAL ULTRASOUND  DOPPLER ULTRASOUND OF THE TESTICLES  TECHNIQUE: Complete ultrasound examination of the testicles, epididymis, and other scrotal structures was performed. Color and spectral Doppler ultrasound were also utilized to evaluate blood flow to the testicles.  COMPARISON:  None.  FINDINGS: Right testicle  Measurements: 4.2 x 1.8 x 3.2 cm. No mass or microlithiasis visualized.  Left testicle  Measurements: 3.9 x 2.1 x 2.9 cm. 2 mm cyst superior pole. No mass or microlithiasis visualized.  Right epididymis: Multiple small cysts in the epididymis. Normal vascularity by Doppler.  Left epididymis: Small cysts in the epididymis. Normal vascularity by Doppler.  Hydrocele:  Small bilateral hydrocele  Varicocele:  None visualized.  Pulsed Doppler interrogation of both testes demonstrates normal low resistance arterial and venous waveforms bilaterally.  IMPRESSION: Negative for testicular torsion  Negative for testicular mass  No areas of increased vascularity are noted. Small bilateral hydrocele.   Electronically Signed   By: Franchot Gallo M.D.   On: 03/17/2015 10:12    Microbiology: Recent Results (from the past 240 hour(s))  Culture, blood (routine x 2) Call MD if unable to obtain prior to antibiotics being given     Status: None (Preliminary result)   Collection Time: 03/16/15 11:42 PM  Result Value Ref Range Status   Specimen Description BLOOD RIGHT ANTECUBITAL  Final   Special Requests BOTTLES DRAWN AEROBIC AND ANAEROBIC 11CC EACH  Final   Culture NO GROWTH 3 DAYS  Final   Report Status PENDING  Incomplete  Culture, blood (routine x 2) Call MD if unable to obtain prior to antibiotics being given     Status: None (Preliminary result)   Collection Time: 03/16/15 11:53 PM  Result Value Ref Range Status   Specimen Description BLOOD LEFT HAND  Final   Special Requests BOTTLES DRAWN AEROBIC AND ANAEROBIC 5CC EACH  Final   Culture  NO GROWTH 3 DAYS  Final   Report Status PENDING  Incomplete  Clostridium Difficile by PCR     Status: None   Collection Time: 03/17/15 11:05 AM  Result Value Ref Range Status   C difficile by pcr NEGATIVE NEGATIVE Final     Labs: Basic Metabolic Panel:  Recent Labs Lab 03/16/15 2030 03/17/15 0605 03/18/15 0601 03/19/15 0607  NA 135 134* 137 138  K 3.6 3.6 3.5 3.9  CL 107 105 109 110  CO2 '22 23 24 24  '$ GLUCOSE 106* 194* 195* 132*  BUN '8 7 8 12  '$ CREATININE 0.65 0.78 0.51* 0.54*  CALCIUM 7.3* 7.1* 7.7* 7.6*   Liver Function Tests:  Recent Labs Lab 03/17/15 0605 03/18/15 0601 03/19/15 0607  AST 33 20 25  ALT 14* 14* 18  ALKPHOS 77 76 70  BILITOT 0.6 0.5 0.4  PROT 4.1* 3.9* 4.0*  ALBUMIN 1.4* 1.4* 1.4*   No results for input(s): LIPASE, AMYLASE in the last 168 hours. No results for input(s): AMMONIA in the last 168 hours. CBC:  Recent Labs Lab 03/16/15 2030 03/17/15 0605 03/18/15 0601 03/19/15 0607  WBC 12.4* 10.9* 19.4* 22.5*  NEUTROABS  --  10.0* 17.6* 21.3*  HGB 10.4* 8.7* 8.7* 8.4*  HCT 30.0* 24.7* 24.2* 24.4*  MCV 106.0* 105.6* 105.7* 106.6*  PLT 188 152 181 198   Cardiac Enzymes:  Recent Labs Lab 03/16/15 2030 03/16/15 2342 03/17/15 0605 03/17/15 1114  TROPONINI <0.03 <0.03 <0.03 <0.03   BNP: BNP (last 3 results)  Recent Labs  03/16/15 2030  BNP 68.0    ProBNP (last 3 results) No results for input(s): PROBNP in the last 8760 hours.  CBG: No results for input(s): GLUCAP in the last 168 hours.   Signed:  Coraima Tibbs K  [facility] 03/19/2015, 3:22 PM

## 2015-03-20 LAB — LEGIONELLA ANTIGEN, URINE

## 2015-03-21 LAB — CULTURE, BLOOD (ROUTINE X 2)
CULTURE: NO GROWTH
Culture: NO GROWTH

## 2015-03-31 DIAGNOSIS — D539 Nutritional anemia, unspecified: Secondary | ICD-10-CM | POA: Diagnosis not present

## 2015-03-31 DIAGNOSIS — R0600 Dyspnea, unspecified: Secondary | ICD-10-CM | POA: Diagnosis not present

## 2015-03-31 DIAGNOSIS — R1011 Right upper quadrant pain: Secondary | ICD-10-CM | POA: Diagnosis not present

## 2015-03-31 DIAGNOSIS — D72829 Elevated white blood cell count, unspecified: Secondary | ICD-10-CM | POA: Diagnosis not present

## 2015-03-31 DIAGNOSIS — I2699 Other pulmonary embolism without acute cor pulmonale: Secondary | ICD-10-CM | POA: Diagnosis not present

## 2015-03-31 DIAGNOSIS — I82403 Acute embolism and thrombosis of unspecified deep veins of lower extremity, bilateral: Secondary | ICD-10-CM | POA: Diagnosis not present

## 2015-03-31 DIAGNOSIS — R188 Other ascites: Secondary | ICD-10-CM | POA: Diagnosis not present

## 2015-03-31 DIAGNOSIS — Z7901 Long term (current) use of anticoagulants: Secondary | ICD-10-CM | POA: Diagnosis not present

## 2015-04-13 ENCOUNTER — Encounter (HOSPITAL_COMMUNITY): Payer: Self-pay | Admitting: Lab

## 2015-04-14 ENCOUNTER — Inpatient Hospital Stay (HOSPITAL_COMMUNITY)
Admission: EM | Admit: 2015-04-14 | Discharge: 2015-04-23 | DRG: 948 | Disposition: A | Discharge status: Home Health | Payer: Medicare Other | Attending: Internal Medicine | Admitting: Internal Medicine

## 2015-04-14 ENCOUNTER — Emergency Department (HOSPITAL_COMMUNITY): Payer: Medicare Other

## 2015-04-14 ENCOUNTER — Encounter (HOSPITAL_COMMUNITY): Payer: Self-pay | Admitting: *Deleted

## 2015-04-14 DIAGNOSIS — I2510 Atherosclerotic heart disease of native coronary artery without angina pectoris: Secondary | ICD-10-CM | POA: Diagnosis not present

## 2015-04-14 DIAGNOSIS — R609 Edema, unspecified: Secondary | ICD-10-CM | POA: Diagnosis not present

## 2015-04-14 DIAGNOSIS — Z8507 Personal history of malignant neoplasm of pancreas: Secondary | ICD-10-CM | POA: Diagnosis not present

## 2015-04-14 DIAGNOSIS — Z90411 Acquired partial absence of pancreas: Secondary | ICD-10-CM | POA: Diagnosis not present

## 2015-04-14 DIAGNOSIS — D539 Nutritional anemia, unspecified: Secondary | ICD-10-CM | POA: Diagnosis not present

## 2015-04-14 DIAGNOSIS — Z79899 Other long term (current) drug therapy: Secondary | ICD-10-CM | POA: Diagnosis not present

## 2015-04-14 DIAGNOSIS — Z9981 Dependence on supplemental oxygen: Secondary | ICD-10-CM | POA: Diagnosis not present

## 2015-04-14 DIAGNOSIS — Z7952 Long term (current) use of systemic steroids: Secondary | ICD-10-CM | POA: Diagnosis not present

## 2015-04-14 DIAGNOSIS — R188 Other ascites: Secondary | ICD-10-CM | POA: Diagnosis not present

## 2015-04-14 DIAGNOSIS — Z791 Long term (current) use of non-steroidal anti-inflammatories (NSAID): Secondary | ICD-10-CM | POA: Diagnosis not present

## 2015-04-14 DIAGNOSIS — E440 Moderate protein-calorie malnutrition: Secondary | ICD-10-CM | POA: Insufficient documentation

## 2015-04-14 DIAGNOSIS — I2699 Other pulmonary embolism without acute cor pulmonale: Secondary | ICD-10-CM | POA: Diagnosis present

## 2015-04-14 DIAGNOSIS — Z86718 Personal history of other venous thrombosis and embolism: Secondary | ICD-10-CM | POA: Diagnosis not present

## 2015-04-14 DIAGNOSIS — Z87891 Personal history of nicotine dependence: Secondary | ICD-10-CM | POA: Diagnosis not present

## 2015-04-14 DIAGNOSIS — Z7901 Long term (current) use of anticoagulants: Secondary | ICD-10-CM

## 2015-04-14 DIAGNOSIS — Z85118 Personal history of other malignant neoplasm of bronchus and lung: Secondary | ICD-10-CM

## 2015-04-14 DIAGNOSIS — I252 Old myocardial infarction: Secondary | ICD-10-CM | POA: Diagnosis not present

## 2015-04-14 DIAGNOSIS — Z681 Body mass index (BMI) 19 or less, adult: Secondary | ICD-10-CM | POA: Diagnosis not present

## 2015-04-14 DIAGNOSIS — R748 Abnormal levels of other serum enzymes: Secondary | ICD-10-CM | POA: Diagnosis present

## 2015-04-14 DIAGNOSIS — F419 Anxiety disorder, unspecified: Secondary | ICD-10-CM | POA: Diagnosis present

## 2015-04-14 DIAGNOSIS — C3490 Malignant neoplasm of unspecified part of unspecified bronchus or lung: Secondary | ICD-10-CM | POA: Diagnosis present

## 2015-04-14 DIAGNOSIS — Z86711 Personal history of pulmonary embolism: Secondary | ICD-10-CM

## 2015-04-14 DIAGNOSIS — R059 Cough, unspecified: Secondary | ICD-10-CM

## 2015-04-14 DIAGNOSIS — R601 Generalized edema: Secondary | ICD-10-CM | POA: Diagnosis not present

## 2015-04-14 DIAGNOSIS — J449 Chronic obstructive pulmonary disease, unspecified: Secondary | ICD-10-CM | POA: Diagnosis not present

## 2015-04-14 DIAGNOSIS — D62 Acute posthemorrhagic anemia: Secondary | ICD-10-CM | POA: Diagnosis not present

## 2015-04-14 DIAGNOSIS — I429 Cardiomyopathy, unspecified: Secondary | ICD-10-CM

## 2015-04-14 DIAGNOSIS — J189 Pneumonia, unspecified organism: Secondary | ICD-10-CM

## 2015-04-14 DIAGNOSIS — F17200 Nicotine dependence, unspecified, uncomplicated: Secondary | ICD-10-CM | POA: Diagnosis present

## 2015-04-14 DIAGNOSIS — R05 Cough: Secondary | ICD-10-CM

## 2015-04-14 DIAGNOSIS — I959 Hypotension, unspecified: Secondary | ICD-10-CM | POA: Diagnosis not present

## 2015-04-14 LAB — I-STAT CHEM 8, ED
CALCIUM ION: 1.15 mmol/L (ref 1.12–1.23)
CHLORIDE: 104 mmol/L (ref 101–111)
Creatinine, Ser: 0.6 mg/dL — ABNORMAL LOW (ref 0.61–1.24)
Glucose, Bld: 71 mg/dL (ref 65–99)
HCT: 35 % — ABNORMAL LOW (ref 39.0–52.0)
HEMOGLOBIN: 11.9 g/dL — AB (ref 13.0–17.0)
Potassium: 4.2 mmol/L (ref 3.5–5.1)
Sodium: 136 mmol/L (ref 135–145)
TCO2: 18 mmol/L (ref 0–100)

## 2015-04-14 LAB — CBC WITH DIFFERENTIAL/PLATELET
Basophils Absolute: 0.1 10*3/uL (ref 0.0–0.1)
Basophils Relative: 1 % (ref 0–1)
Eosinophils Absolute: 0 10*3/uL (ref 0.0–0.7)
Eosinophils Relative: 0 % (ref 0–5)
HCT: 32.6 % — ABNORMAL LOW (ref 39.0–52.0)
Hemoglobin: 11.3 g/dL — ABNORMAL LOW (ref 13.0–17.0)
Lymphocytes Relative: 42 % (ref 12–46)
Lymphs Abs: 2.8 10*3/uL (ref 0.7–4.0)
MCH: 36.3 pg — ABNORMAL HIGH (ref 26.0–34.0)
MCHC: 34.7 g/dL (ref 30.0–36.0)
MCV: 104.8 fL — ABNORMAL HIGH (ref 78.0–100.0)
Monocytes Absolute: 0.7 10*3/uL (ref 0.1–1.0)
Monocytes Relative: 10 % (ref 3–12)
Neutro Abs: 3.1 10*3/uL (ref 1.7–7.7)
Neutrophils Relative %: 47 % (ref 43–77)
Platelets: 205 10*3/uL (ref 150–400)
RBC: 3.11 MIL/uL — ABNORMAL LOW (ref 4.22–5.81)
RDW: 15.1 % (ref 11.5–15.5)
WBC: 6.6 10*3/uL (ref 4.0–10.5)

## 2015-04-14 LAB — COMPREHENSIVE METABOLIC PANEL
ALT: 27 U/L (ref 17–63)
AST: 51 U/L — ABNORMAL HIGH (ref 15–41)
Albumin: 1.9 g/dL — ABNORMAL LOW (ref 3.5–5.0)
Alkaline Phosphatase: 112 U/L (ref 38–126)
Anion gap: 5 (ref 5–15)
BUN: <5 mg/dL — ABNORMAL LOW (ref 6–20)
CO2: 22 mmol/L (ref 22–32)
Calcium: 7.4 mg/dL — ABNORMAL LOW (ref 8.9–10.3)
Chloride: 109 mmol/L (ref 101–111)
Creatinine, Ser: 0.67 mg/dL (ref 0.61–1.24)
GFR calc Af Amer: >60 mL/min (ref >60)
GFR calc non Af Amer: >60 mL/min (ref >60)
Glucose, Bld: 74 mg/dL (ref 65–99)
Potassium: 4.2 mmol/L (ref 3.5–5.1)
Sodium: 136 mmol/L (ref 135–145)
Total Bilirubin: 0.4 mg/dL (ref 0.3–1.2)
Total Protein: 4.5 g/dL — ABNORMAL LOW (ref 6.5–8.1)

## 2015-04-14 LAB — BRAIN NATRIURETIC PEPTIDE: B Natriuretic Peptide: 52 pg/mL (ref 0.0–100.0)

## 2015-04-14 LAB — TSH: TSH: 1.353 u[IU]/mL (ref 0.350–4.500)

## 2015-04-14 LAB — LIPASE, BLOOD: LIPASE: 11 U/L — AB (ref 22–51)

## 2015-04-14 MED ORDER — HYDROMORPHONE HCL 1 MG/ML IJ SOLN
1.0000 mg | Freq: Once | INTRAMUSCULAR | Status: DC
  Filled 2015-04-14: qty 1

## 2015-04-14 MED ORDER — SODIUM CHLORIDE 0.9 % IV SOLN: 250.0000 mL | INTRAVENOUS | Status: DC | PRN

## 2015-04-14 MED ORDER — ONDANSETRON HCL 4 MG/2ML IJ SOLN
4.0000 mg | Freq: Once | INTRAMUSCULAR | Status: AC
  Administered 2015-04-14: 4 mg via INTRAMUSCULAR
  Filled 2015-04-14: qty 2

## 2015-04-14 MED ORDER — SPIRONOLACTONE 25 MG PO TABS
25.0000 mg | ORAL_TABLET | Freq: Every day | ORAL | Status: DC
  Administered 2015-04-15: 25 mg via ORAL
  Filled 2015-04-14 (×3): qty 1

## 2015-04-14 MED ORDER — IOHEXOL 300 MG/ML  SOLN
100.0000 mL | Freq: Once | INTRAMUSCULAR | Status: AC | PRN
  Administered 2015-04-14: 100 mL via INTRAVENOUS

## 2015-04-14 MED ORDER — ENOXAPARIN SODIUM 150 MG/ML ~~LOC~~ SOLN
100.0000 mg | SUBCUTANEOUS | Status: DC
  Administered 2015-04-14: 100 mg via SUBCUTANEOUS
  Filled 2015-04-14 (×3): qty 1

## 2015-04-14 MED ORDER — FUROSEMIDE 10 MG/ML IJ SOLN
40.0000 mg | Freq: Once | INTRAMUSCULAR | Status: AC
  Administered 2015-04-14: 40 mg via INTRAVENOUS
  Filled 2015-04-14: qty 4

## 2015-04-14 MED ORDER — SODIUM CHLORIDE 0.9 % IJ SOLN
3.0000 mL | Freq: Two times a day (BID) | INTRAMUSCULAR | Status: DC
  Administered 2015-04-15 – 2015-04-22 (×7): 3 mL via INTRAVENOUS

## 2015-04-14 MED ORDER — FUROSEMIDE 10 MG/ML IJ SOLN
40.0000 mg | Freq: Two times a day (BID) | INTRAMUSCULAR | Status: DC
Start: 2015-04-14 — End: 2015-04-15
  Administered 2015-04-15 (×2): 40 mg via INTRAVENOUS
  Filled 2015-04-14 (×2): qty 4

## 2015-04-14 MED ORDER — HYDROMORPHONE HCL 1 MG/ML IJ SOLN
0.5000 mg | Freq: Once | INTRAMUSCULAR | Status: AC
  Administered 2015-04-14: 0.5 mg via INTRAVENOUS
  Filled 2015-04-14: qty 1

## 2015-04-14 MED ORDER — HYDROMORPHONE HCL 1 MG/ML IJ SOLN
0.5000 mg | Freq: Once | INTRAMUSCULAR | Status: AC
  Administered 2015-04-14: 0.5 mg via INTRAVENOUS

## 2015-04-14 MED ORDER — SODIUM CHLORIDE 0.9 % IJ SOLN
3.0000 mL | INTRAMUSCULAR | Status: DC | PRN
  Administered 2015-04-19: 3 mL via INTRAVENOUS
  Filled 2015-04-14: qty 3

## 2015-04-14 NOTE — ED Notes (Signed)
Hospitalist at bedside 

## 2015-04-14 NOTE — ED Provider Notes (Signed)
CSN: [id number]     Arrival date & time 04/14/15  1433 History   First MD Initiated Contact with Patient 04/14/15 1543     Chief Complaint  Patient presents with  . Leg Swelling     (Consider location/radiation/quality/duration/timing/severity/associated sxs/prior Treatment) Patient is a 54 y.o. male presenting with weakness. The history is provided by the patient (pt complains of swelling to legs and sob).  Weakness This is a recurrent problem. The current episode started more than 2 days ago. The problem occurs constantly. The problem has not changed since onset.Associated symptoms include shortness of breath. Pertinent negatives include no chest pain, no abdominal pain and no headaches. Nothing aggravates the symptoms. Nothing relieves the symptoms.    Past Medical History  Diagnosis Date  . Lateral epicondylitis   . Chest pain, unspecified   . Abdominal pain, other specified site   . Obstruction of bile duct   . Tobacco use disorder   . Anxiety   . Shortness of breath     uses oxygen at night- 2 liters   . Myocardial infarction 2007    during [place]- 2007  . Complication of anesthesia     pt. reports that he had MI while under anesth. for Whipple procedure  . Pancreatic cancer      status post Whipple procedure.  . Lung cancer    Past Surgical History  Procedure Laterality Date  . Whipple procedure  2007  . Percutaneous extraction of gallstones      2010[facility]  . Knee arthroscopy      R knee- at [facility]  . Cholecystectomy      open  . Video bronchoscopy  04/08/2012    Procedure: VIDEO BRONCHOSCOPY;  Surgeon: Gaye Pollack, MD;  Location: [facility] OR;  Service: Thoracic;  Laterality: N/A;   Family History  Problem Relation Age of Onset  . Stroke Father 29    Deceased  . Anesthesia problems Neg Hx    History  Substance Use Topics  . Smoking status: Former Smoker -- 1.00 packs/day for 28 years    Types: Cigarettes    Quit date:  01/02/2012  . Smokeless tobacco: Never Used  . Alcohol Use: Yes     Comment:  Rare alcohol use    Review of Systems  Constitutional: Negative for appetite change and fatigue.  HENT: Negative for congestion, ear discharge and sinus pressure.   Eyes: Negative for discharge.  Respiratory: Positive for shortness of breath. Negative for cough.   Cardiovascular: Negative for chest pain.  Gastrointestinal: Negative for abdominal pain and diarrhea.  Genitourinary: Negative for frequency and hematuria.  Musculoskeletal: Negative for back pain.  Skin: Negative for rash.  Neurological: Positive for weakness. Negative for seizures and headaches.  Psychiatric/Behavioral: Negative for hallucinations.      Allergies  Review of patient's allergies indicates no known allergies.  Home Medications   Prior to Admission medications   Medication Sig Start Date End Date Taking? Authorizing Provider  acetaminophen (TYLENOL) 500 MG tablet Take 500 mg by mouth every 6 (six) hours as needed for mild pain or moderate pain.   Yes Historical Provider, MD  enoxaparin (LOVENOX) 100 MG/ML injection Inject 1 mL (100 mg total) into the skin daily. 03/19/15  Yes Donne Hazel, MD  ferrous sulfate 325 (65 FE) MG tablet Take 325 mg by mouth 2 (two) times daily.   Yes Historical Provider, MD  OXYGEN Inhale 2 L into the lungs daily.  Yes Historical Provider, MD  spironolactone (ALDACTONE) 25 MG tablet Take 25 mg by mouth daily. 04/11/15  Yes Historical Provider, MD  levofloxacin (LEVAQUIN) 750 MG tablet Take 1 tablet (750 mg total) by mouth daily at 6 PM. Patient not taking: Reported on 04/14/2015 03/19/15   Donne Hazel, MD  predniSONE (DELTASONE) 5 MG tablet Take 1 tablet (5 mg total) by mouth daily with breakfast. Patient not taking: Reported on 04/14/2015 03/19/15   Donne Hazel, MD   BP 116/87 mmHg  Pulse 78  Temp(Src) 99.7 F (37.6 C) (Core (Comment))  Resp 16  Ht 5' 11.5" (1.816 m)  Wt 158 lb (71.668 kg)   BMI 21.73 kg/m2  SpO2 100% Physical Exam  Constitutional: He is oriented to person, place, and time. He appears well-developed.  HENT:  Head: Normocephalic.  Eyes: Conjunctivae and EOM are normal. No scleral icterus.  Neck: Neck supple. No thyromegaly present.  Cardiovascular: Normal rate and regular rhythm.  Exam reveals no gallop and no friction rub.   No murmur heard. Pulmonary/Chest: No stridor. He has no wheezes. He has no rales. He exhibits no tenderness.  Abdominal: He exhibits no distension. There is no tenderness. There is no rebound.  Edema in lower abd  Musculoskeletal: He exhibits edema.  2 plus edema in both legs  Lymphadenopathy:    He has no cervical adenopathy.  Neurological: He is oriented to person, place, and time. He exhibits normal muscle tone. Coordination normal.  Skin: No rash noted. No erythema.  Psychiatric: He has a normal mood and affect. His behavior is normal.    ED Course  Procedures (including critical care time) Labs Review Labs Reviewed  CBC WITH DIFFERENTIAL/PLATELET - Abnormal; Notable for the following:    RBC 3.11 (*)    Hemoglobin 11.3 (*)    HCT 32.6 (*)    MCV 104.8 (*)    MCH 36.3 (*)    All other components within normal limits  COMPREHENSIVE METABOLIC PANEL - Abnormal; Notable for the following:    BUN <5 (*)    Calcium 7.4 (*)    Total Protein 4.5 (*)    Albumin 1.9 (*)    AST 51 (*)    All other components within normal limits  LIPASE, BLOOD - Abnormal; Notable for the following:    Lipase 11 (*)    All other components within normal limits  I-STAT CHEM 8, ED - Abnormal; Notable for the following:    BUN <3 (*)    Creatinine, Ser 0.60 (*)    Hemoglobin 11.9 (*)    HCT 35.0 (*)    All other components within normal limits  BRAIN NATRIURETIC PEPTIDE    Imaging Review Dg Chest 2 View  04/14/2015   CLINICAL DATA:  Leg swelling  EXAM: CHEST  2 VIEW  COMPARISON:  03/16/2015  FINDINGS: Small bilateral pleural effusion,  greater on the right. The volume and appearance is similar to 03/16/2015. Mild atelectasis or scarring at the bases with a 14 mm nodular opacity at the peripheral right base which was described on abdominal CT 08/01/2014. Chest CT 03/17/2015 also showed nodular densities and three-month follow-up CT was suggested. No pneumonia, edema, or pneumothorax. Normal heart size and mediastinal contours.  IMPRESSION: Small bilateral pleural effusion.  No pulmonary edema.   Electronically Signed   By: Monte Fantasia M.D.   On: 04/14/2015 17:13   Ct Abdomen Pelvis W Contrast  04/14/2015   CLINICAL DATA:  Abdominal pain/swelling, history of  pancreatic cancer status post Whipple 6 years ago, history of lung cancer  EXAM: CT ABDOMEN AND PELVIS WITH CONTRAST  TECHNIQUE: Multidetector CT imaging of the abdomen and pelvis was performed using the standard protocol following bolus administration of intravenous contrast.  CONTRAST:  158m OMNIPAQUE IOHEXOL 300 MG/ML  SOLN  COMPARISON:  CT abdomen pelvis dated 03/17/2015  FINDINGS: Lower chest: Moderate right pleural effusion. Trace fluid along the lateral left lung base. Minimal dependent atelectasis bilaterally.  Hepatobiliary: Geographic heterogeneous perfusion inferiorly within the right hepatic lobe (series 2/image 26), unchanged, favored to reflect a perfusion anomaly/shunting. No suspicious hepatic lesions.  Status post cholecystectomy. Suspected choledochojejunostomy. No intrahepatic or extrahepatic ductal dilatation.  Pancreas: Status post resection of the pancreatic head/ uncinate process. Residual pancreatic body/ tail is within normal limits.  Spleen: Within normal limits.  Adrenals/Urinary Tract: Adrenal glands are within normal limits.  Kidneys are within normal limits.  No hydronephrosis.  Bladder is thick-walled although underdistended.  Stomach/Bowel: Status post partial gastrectomy with gastrojejunostomy.  No evidence of bowel obstruction.  Colon is mildly  thick-walled but largely decompressed.  Vascular/Lymphatic: Atherosclerotic calcifications of the abdominal aorta and branch vessels.  No suspicious abdominopelvic lymphadenopathy.  Reproductive: Mild prostatomegaly.  Other: Large volume abdominopelvic ascites.  Musculoskeletal: Mild degenerative changes of the lumbar spine.  IMPRESSION: Postsurgical change related to prior Whipple procedure.  No findings specific for recurrent or metastatic disease.  Large volume abdominopelvic ascites. Moderate right pleural effusion.  Additional ancillary findings as above.   Electronically Signed   By: SJulian HyM.D.   On: 04/14/2015 20:18     EKG Interpretation   Date/Time:  Friday April 14 2015 16:14:42 EDT Ventricular Rate:  79 PR Interval:  136 QRS Duration: 77 QT Interval:  397 QTC Calculation: 455 R Axis:   70 Text Interpretation:  Sinus rhythm Low voltage, extremity and precordial  leads Confirmed by ZACKOWSKI  MD, SCOTT ([id number] on 04/14/2015 4:27:54 PM      MDM   Final diagnoses:  Swelling  Anasarca    Admit for anasarca    JMilton Ferguson MD 04/14/15 2123

## 2015-04-14 NOTE — ED Notes (Signed)
Swelling to legs, abdomen and shortness of breath

## 2015-04-14 NOTE — H&P (Addendum)
Hospitalist Admission History and Physical  Patient name: Patrick Lee Medical record number: [id number] Date of birth: 03-Aug-1961 Age: 54 y.o. Gender: male  Primary Care Provider: [name]  Chief Complaint: anasarca   History of Present Illness:This is a 54 y.o. year old male with significant past medical history of pancreatic cancer s/p whipple procedure, lung cancer, DVT and PE on lovenox, CAD  presenting with anasarca. Patient reports progressive abdominal and lower extremity swelling over the past 2-3 weeks. Has seen his PCP about symptoms. Was placed on spironolactone for treatment. Patient reports her minimal improvement in symptoms. Denies any NSAID use. Reports decreased salt intake over the time frame. Denies any orthopnea or PND. Denies any alcohol abuse. Reports this is been a subacute on chronic issue for several months. Nonsmoker. Has been compliant with anticoagulation regimen. Presented to the ER afebrile, hemodynamically stable. CBC grossly within normal limits. CMP grossly within normal limits. Lipase within normal limits. BNP within normal limits at 52. CT of the abdomen and pelvis shows postsurgical changes secondary to will procedure. No findings of recurrent metastatic disease. Large volume abdominopelvic ascites and moderate right pleural effusion. Assessment and Plan: SENDER [name] is a 54 y.o. year old male presenting with Anasarca    Active Problems:   Anasarca   1- Anasarca -broad ddx include renal, hepatic, cardiac, post thrombotic etiologies among others -baseline hx/o lung and pancreatic cancer s/p whipple is also a concern-no findings of metastatic disease noted on imaging.  -2D ECHO 03/2015 WNL-BNP stable -LFTs stable-denies ETOH abuse-no cirrhotic changes on imaging  -will transfer to [facility] for likely am paracentesis as this may help guide in diagnosis-defer to am team -IV lasix for now  -cont aldactone  2-Hx/o DVT and PE -no resp  distress -may be an element of post-thrombotic syndrome to above -cont lovenox   3-CAD -no active CP  -Follow  FEN/GI: heart healthy diet  Prophylaxis: lovenox  Disposition: pending further evaluation  Code Status:Full Code    Patient Active Problem List   Diagnosis Date Noted  . Anasarca 04/14/2015  . Acute deep vein thrombosis of both lower extremities 03/19/2015  . Acute pulmonary embolism 03/19/2015  . Long term current use of anticoagulant therapy 03/19/2015  . Acute on chronic respiratory failure with hypoxia 03/16/2015  . H/O pancreatic cancer 04/19/2013  . Former smoker 04/19/2013  . Abnormal transaminases 04/19/2013  . Malignant neoplasm of lower lobe, bronchus, or lung 08/11/2012  . Cardiomyopathy 04/17/2011  . Preoperative cardiovascular examination 04/17/2011  . Dyspnea 03/26/2011  . Chest pain, unspecified   . Tobacco use disorder    Past Medical History: Past Medical History  Diagnosis Date  . Lateral epicondylitis   . Chest pain, unspecified   . Abdominal pain, other specified site   . Obstruction of bile duct   . Tobacco use disorder   . Anxiety   . Shortness of breath     uses oxygen at night- 2 liters   . Myocardial infarction 2007    during [place]- 2007  . Complication of anesthesia     pt. reports that he had MI while under anesth. for Whipple procedure  . Pancreatic cancer      status post Whipple procedure.  . Lung cancer     Past Surgical History: Past Surgical History  Procedure Laterality Date  . Whipple procedure  2007  . Percutaneous extraction of gallstones      [facility]  .  Knee arthroscopy      R knee- at [facility]  . Cholecystectomy      open  . Video bronchoscopy  04/08/2012    Procedure: VIDEO BRONCHOSCOPY;  Surgeon: Gaye Pollack, MD;  Location: [facility] OR;  Service: Thoracic;  Laterality: N/A;    Social History: History   Social History  . Marital Status: Divorced    Spouse Name: N/A  .  Number of Children: 4  . Years of Education: N/A   Occupational History  . UNEMPLOYED    Social History Main Topics  . Smoking status: Former Smoker -- 1.00 packs/day for 28 years    Types: Cigarettes    Quit date: 01/02/2012  . Smokeless tobacco: Never Used  . Alcohol Use: Yes     Comment:  Rare alcohol use  . Drug Use: No     Comment: History of illicit substance abuse  . Sexual Activity: Not on file   Other Topics Concern  . None   Social History Narrative    Family History: Family History  Problem Relation Age of Onset  . Stroke Father 55    Deceased  . Anesthesia problems Neg Hx     Allergies: No Known Allergies  Current Facility-Administered Medications  Medication Dose Route Frequency Provider Last Rate Last Dose  . 0.9 %  sodium chloride infusion  250 mL Intravenous PRN Deneise Lever, MD      . enoxaparin (LOVENOX) injection 100 mg  100 mg Subcutaneous Q24H Deneise Lever, MD      . sodium chloride 0.9 % injection 3 mL  3 mL Intravenous Q12H Deneise Lever, MD      . sodium chloride 0.9 % injection 3 mL  3 mL Intravenous PRN Deneise Lever, MD       Current Outpatient Prescriptions  Medication Sig Dispense Refill  . acetaminophen (TYLENOL) 500 MG tablet Take 500 mg by mouth every 6 (six) hours as needed for mild pain or moderate pain.    Marland Kitchen enoxaparin (LOVENOX) 100 MG/ML injection Inject 1 mL (100 mg total) into the skin daily. 30 Syringe 0  . ferrous sulfate 325 (65 FE) MG tablet Take 325 mg by mouth 2 (two) times daily.    . OXYGEN Inhale 2 L into the lungs daily.    Marland Kitchen spironolactone (ALDACTONE) 25 MG tablet Take 25 mg by mouth daily.    Marland Kitchen levofloxacin (LEVAQUIN) 750 MG tablet Take 1 tablet (750 mg total) by mouth daily at 6 PM. (Patient not taking: Reported on 04/14/2015) 4 tablet 0  . predniSONE (DELTASONE) 5 MG tablet Take 1 tablet (5 mg total) by mouth daily with breakfast. (Patient not taking: Reported on 04/14/2015)     Review Of Systems: Complete  ROS negative except as noted above in HPI.  Physical Exam: Filed Vitals:   04/14/15 2130  BP: 104/74  Pulse: 80  Temp:   Resp: 14    General: alert and cooperative HEENT: PERRLA and extra ocular movement intact Heart: S1, S2 normal, no murmur, rub or gallop, regular rate and rhythm Lungs: clear to auscultation, no wheezes or rales and unlabored breathing Abdomen:+ mild abd distension, mild abd TTP, + bowel sounds Extremities: 2-3+ pitting edema in LEs  Skin:no rashes Neurology: normal without focal findings Psych: mood stable   Labs and Imaging: Lab Results  Component Value Date/Time   NA 136 04/14/2015 04:13 PM   K 4.2 04/14/2015 04:13 PM   CL 104 04/14/2015 04:13 PM  CO2 22 04/14/2015 04:07 PM   BUN <3* 04/14/2015 04:13 PM   CREATININE 0.60* 04/14/2015 04:13 PM   CREATININE 1.05 02/22/2013 04:22 PM   GLUCOSE 71 04/14/2015 04:13 PM   Lab Results  Component Value Date   WBC 6.6 04/14/2015   HGB 11.9* 04/14/2015   HCT 35.0* 04/14/2015   MCV 104.8* 04/14/2015   PLT 205 04/14/2015    Dg Chest 2 View  04/14/2015   CLINICAL DATA:  Leg swelling  EXAM: CHEST  2 VIEW  COMPARISON:  03/16/2015  FINDINGS: Small bilateral pleural effusion, greater on the right. The volume and appearance is similar to 03/16/2015. Mild atelectasis or scarring at the bases with a 14 mm nodular opacity at the peripheral right base which was described on abdominal CT 08/01/2014. Chest CT 03/17/2015 also showed nodular densities and three-month follow-up CT was suggested. No pneumonia, edema, or pneumothorax. Normal heart size and mediastinal contours.  IMPRESSION: Small bilateral pleural effusion.  No pulmonary edema.   Electronically Signed   By: Monte Fantasia M.D.   On: 04/14/2015 17:13   Ct Abdomen Pelvis W Contrast  04/14/2015   CLINICAL DATA:  Abdominal pain/swelling, history of pancreatic cancer status post Whipple 6 years ago, history of lung cancer  EXAM: CT ABDOMEN AND PELVIS WITH CONTRAST   TECHNIQUE: Multidetector CT imaging of the abdomen and pelvis was performed using the standard protocol following bolus administration of intravenous contrast.  CONTRAST:  148m OMNIPAQUE IOHEXOL 300 MG/ML  SOLN  COMPARISON:  CT abdomen pelvis dated 03/17/2015  FINDINGS: Lower chest: Moderate right pleural effusion. Trace fluid along the lateral left lung base. Minimal dependent atelectasis bilaterally.  Hepatobiliary: Geographic heterogeneous perfusion inferiorly within the right hepatic lobe (series 2/image 26), unchanged, favored to reflect a perfusion anomaly/shunting. No suspicious hepatic lesions.  Status post cholecystectomy. Suspected choledochojejunostomy. No intrahepatic or extrahepatic ductal dilatation.  Pancreas: Status post resection of the pancreatic head/ uncinate process. Residual pancreatic body/ tail is within normal limits.  Spleen: Within normal limits.  Adrenals/Urinary Tract: Adrenal glands are within normal limits.  Kidneys are within normal limits.  No hydronephrosis.  Bladder is thick-walled although underdistended.  Stomach/Bowel: Status post partial gastrectomy with gastrojejunostomy.  No evidence of bowel obstruction.  Colon is mildly thick-walled but largely decompressed.  Vascular/Lymphatic: Atherosclerotic calcifications of the abdominal aorta and branch vessels.  No suspicious abdominopelvic lymphadenopathy.  Reproductive: Mild prostatomegaly.  Other: Large volume abdominopelvic ascites.  Musculoskeletal: Mild degenerative changes of the lumbar spine.  IMPRESSION: Postsurgical change related to prior Whipple procedure.  No findings specific for recurrent or metastatic disease.  Large volume abdominopelvic ascites. Moderate right pleural effusion.  Additional ancillary findings as above.   Electronically Signed   By: SJulian HyM.D.   On: 04/14/2015 20:18        Greater than 50% of greater than 70 minutes spent with patient in terms of direct patient care and/or care  coordination.   Imaging studies and EKG readings personally reviewed    SShanda HowellsMD  Pager: [phone]

## 2015-04-15 ENCOUNTER — Observation Stay (HOSPITAL_COMMUNITY): Payer: Medicare Other

## 2015-04-15 DIAGNOSIS — Z681 Body mass index (BMI) 19 or less, adult: Secondary | ICD-10-CM | POA: Diagnosis not present

## 2015-04-15 DIAGNOSIS — Z90411 Acquired partial absence of pancreas: Secondary | ICD-10-CM | POA: Diagnosis present

## 2015-04-15 DIAGNOSIS — I959 Hypotension, unspecified: Secondary | ICD-10-CM | POA: Diagnosis present

## 2015-04-15 DIAGNOSIS — Z87891 Personal history of nicotine dependence: Secondary | ICD-10-CM | POA: Diagnosis not present

## 2015-04-15 DIAGNOSIS — I2510 Atherosclerotic heart disease of native coronary artery without angina pectoris: Secondary | ICD-10-CM | POA: Diagnosis present

## 2015-04-15 DIAGNOSIS — J449 Chronic obstructive pulmonary disease, unspecified: Secondary | ICD-10-CM | POA: Diagnosis present

## 2015-04-15 DIAGNOSIS — C259 Malignant neoplasm of pancreas, unspecified: Secondary | ICD-10-CM | POA: Diagnosis not present

## 2015-04-15 DIAGNOSIS — Z8507 Personal history of malignant neoplasm of pancreas: Secondary | ICD-10-CM | POA: Diagnosis not present

## 2015-04-15 DIAGNOSIS — E440 Moderate protein-calorie malnutrition: Secondary | ICD-10-CM | POA: Diagnosis present

## 2015-04-15 DIAGNOSIS — Z9981 Dependence on supplemental oxygen: Secondary | ICD-10-CM | POA: Diagnosis not present

## 2015-04-15 DIAGNOSIS — I252 Old myocardial infarction: Secondary | ICD-10-CM | POA: Diagnosis not present

## 2015-04-15 DIAGNOSIS — I429 Cardiomyopathy, unspecified: Secondary | ICD-10-CM | POA: Diagnosis not present

## 2015-04-15 DIAGNOSIS — C787 Secondary malignant neoplasm of liver and intrahepatic bile duct: Secondary | ICD-10-CM | POA: Diagnosis not present

## 2015-04-15 DIAGNOSIS — R740 Nonspecific elevation of levels of transaminase and lactic acid dehydrogenase [LDH]: Secondary | ICD-10-CM | POA: Diagnosis not present

## 2015-04-15 DIAGNOSIS — C3490 Malignant neoplasm of unspecified part of unspecified bronchus or lung: Secondary | ICD-10-CM | POA: Diagnosis present

## 2015-04-15 DIAGNOSIS — Z7901 Long term (current) use of anticoagulants: Secondary | ICD-10-CM | POA: Diagnosis not present

## 2015-04-15 DIAGNOSIS — D539 Nutritional anemia, unspecified: Secondary | ICD-10-CM | POA: Diagnosis present

## 2015-04-15 DIAGNOSIS — R188 Other ascites: Secondary | ICD-10-CM | POA: Diagnosis present

## 2015-04-15 DIAGNOSIS — F419 Anxiety disorder, unspecified: Secondary | ICD-10-CM | POA: Diagnosis present

## 2015-04-15 DIAGNOSIS — Z86711 Personal history of pulmonary embolism: Secondary | ICD-10-CM | POA: Diagnosis not present

## 2015-04-15 DIAGNOSIS — R609 Edema, unspecified: Secondary | ICD-10-CM | POA: Diagnosis present

## 2015-04-15 DIAGNOSIS — Z791 Long term (current) use of non-steroidal anti-inflammatories (NSAID): Secondary | ICD-10-CM | POA: Diagnosis not present

## 2015-04-15 DIAGNOSIS — Z86718 Personal history of other venous thrombosis and embolism: Secondary | ICD-10-CM | POA: Diagnosis not present

## 2015-04-15 DIAGNOSIS — D62 Acute posthemorrhagic anemia: Secondary | ICD-10-CM | POA: Diagnosis present

## 2015-04-15 DIAGNOSIS — Z720 Tobacco use: Secondary | ICD-10-CM | POA: Diagnosis not present

## 2015-04-15 DIAGNOSIS — I2699 Other pulmonary embolism without acute cor pulmonale: Secondary | ICD-10-CM | POA: Diagnosis not present

## 2015-04-15 DIAGNOSIS — Z79899 Other long term (current) drug therapy: Secondary | ICD-10-CM | POA: Diagnosis not present

## 2015-04-15 DIAGNOSIS — Z7952 Long term (current) use of systemic steroids: Secondary | ICD-10-CM | POA: Diagnosis not present

## 2015-04-15 DIAGNOSIS — R601 Generalized edema: Secondary | ICD-10-CM | POA: Diagnosis not present

## 2015-04-15 LAB — PROTEIN, BODY FLUID: Total protein, fluid: <3 g/dL

## 2015-04-15 LAB — BODY FLUID CELL COUNT WITH DIFFERENTIAL
Lymphs, Fluid: 72 %
MONOCYTE-MACROPHAGE-SEROUS FLUID: 20 % — AB (ref 50–90)
NEUTROPHIL FLUID: 8 % (ref 0–25)
Total Nucleated Cell Count, Fluid: 295 cu mm (ref 0–1000)

## 2015-04-15 LAB — ALBUMIN, FLUID (OTHER)

## 2015-04-15 LAB — GLUCOSE, PERITONEAL FLUID: GLUCOSE, PERITONEAL FLUID: 132 mg/dL

## 2015-04-15 LAB — LACTATE DEHYDROGENASE, PLEURAL OR PERITONEAL FLUID: LD FL: 28 U/L — AB (ref 3–23)

## 2015-04-15 MED ORDER — FUROSEMIDE 40 MG PO TABS
40.0000 mg | ORAL_TABLET | Freq: Every day | ORAL | Status: DC
  Administered 2015-04-16 – 2015-04-18 (×3): 40 mg via ORAL
  Filled 2015-04-15 (×4): qty 1

## 2015-04-15 MED ORDER — OXYCODONE HCL 5 MG PO TABS
5.0000 mg | ORAL_TABLET | ORAL | Status: DC | PRN
  Administered 2015-04-15 – 2015-04-16 (×9): 10 mg via ORAL
  Filled 2015-04-15 (×9): qty 2

## 2015-04-15 MED ORDER — LIDOCAINE HCL (PF) 1 % IJ SOLN
INTRAMUSCULAR | Status: AC
  Filled 2015-04-15: qty 10

## 2015-04-15 MED ORDER — ENOXAPARIN SODIUM 100 MG/ML ~~LOC~~ SOLN
100.0000 mg | SUBCUTANEOUS | Status: DC
  Administered 2015-04-15 – 2015-04-22 (×8): 100 mg via SUBCUTANEOUS
  Filled 2015-04-15 (×8): qty 1

## 2015-04-15 MED ORDER — SPIRONOLACTONE 100 MG PO TABS
100.0000 mg | ORAL_TABLET | Freq: Every day | ORAL | Status: DC
  Administered 2015-04-16 – 2015-04-19 (×4): 100 mg via ORAL
  Filled 2015-04-15 (×4): qty 1

## 2015-04-15 MED ORDER — HYDROMORPHONE HCL 1 MG/ML IJ SOLN
0.5000 mg | Freq: Once | INTRAMUSCULAR | Status: AC
  Administered 2015-04-15: 0.5 mg via INTRAVENOUS
  Filled 2015-04-15: qty 1

## 2015-04-15 NOTE — Progress Notes (Signed)
Pt admitted to 6N9 From [facility] at 0130, alert and orientedX4

## 2015-04-15 NOTE — Progress Notes (Signed)
Patrick Lee XHB:[id number] DOB: 09-23-1961 DOA: 04/14/2015 PCP: [facility]  Brief narrative:  54 y/o ? known CAD-neg Cardiolite 2004, EF 46% 04/10/11, Whipple for Panc Ca 06/25/06-ChemoRx for + nodes former smoker, h/o RLL lung ca s/p wedge resection of superior segment lung lesion, right lower lobectomy by Dr. Cyndia Bent on 04/08/2012, recent  Acute PE and LE DVT on Lovenox admitted with anasarca from [place] me he used to drink occasionally only-Once a week per him No h/o blood transfusions  Past medical history-As per Problem list Chart reviewed as below-   Consultants:  None yet  Procedures:  Paracentesis abd 6/11  Antibiotics:   none yet   Subjective   Doing fair Worried about swelling Tells me has lost a lot of weight unintentionally in the past cpl mo's No n/v/cp eating and drinking well despite this-no early satiety    Objective     Objective: Filed Vitals:   04/15/15 0000 04/15/15 0030 04/15/15 0135 04/15/15 0557  BP: 102/78 103/77 110/75 112/76  Pulse: 98 92 101 102  Temp:   98 F (36.7 C) 97.9 F (36.6 C)  TempSrc:   Oral Oral  Resp: '16 14 16 17  '$ Height:   '5\' 11"'$  (1.803 m)   Weight:   68.221 kg (150 lb 6.4 oz) 68.856 kg (151 lb 12.8 oz)  SpO2: 99% 100% 100% 99%    Intake/Output Summary (Last 24 hours) at 04/15/15 1145 Last data filed at 04/15/15 0908  Gross per 24 hour  Intake      0 ml  Output   3006 ml  Net  -3006 ml    Exam:  General: frail, eomi, ncat Cardiovascular: s1 s 2no m/r/g Respiratory: clear no added sound Abdomen: soft-midline scar.  No rebound no guard. Cannot appreciate shift dullness Skin anicteric  Data Reviewed: Basic Metabolic Panel:  Recent Labs Lab 04/14/15 1607 04/14/15 1613  NA 136 136  K 4.2 4.2  CL 109 104  CO2 22  --   GLUCOSE 74 71  BUN <5* <3*  CREATININE 0.67 0.60*  CALCIUM 7.4*  --    Liver Function Tests:  Recent Labs Lab 04/14/15 1607  AST 51*  ALT 27  ALKPHOS 112    BILITOT 0.4  PROT 4.5*  ALBUMIN 1.9*    Recent Labs Lab 04/14/15 1607  LIPASE 11*   No results for input(s): AMMONIA in the last 168 hours. CBC:  Recent Labs Lab 04/14/15 1607 04/14/15 1613  WBC 6.6  --   NEUTROABS 3.1  --   HGB 11.3* 11.9*  HCT 32.6* 35.0*  MCV 104.8*  --   PLT 205  --    Cardiac Enzymes: No results for input(s): CKTOTAL, CKMB, CKMBINDEX, TROPONINI in the last 168 hours. BNP: Invalid input(s): POCBNP CBG: No results for input(s): GLUCAP in the last 168 hours.  No results found for this or any previous visit (from the past 240 hour(s)).   Studies:              All Imaging reviewed and is as per above notation   Scheduled Meds: . enoxaparin (LOVENOX) injection  100 mg Subcutaneous Q24H  . furosemide  40 mg Intravenous Q12H  . sodium chloride  3 mL Intravenous Q12H  . spironolactone  25 mg Oral Daily   Continuous Infusions:    Assessment/Plan: New onset Ascites-SAAG (gm/dL) <1.1--Consistent with carcinomatosis, tuberculosis, pancreatitis, serositis, or nephrosis.   ?pancreatic malignancy recurrence.  His h/o weight loss  serves as a non-specific but surrogate symptom.   Await cytology-if + will d/w GI and Oncology.   Diuresing well however-keep aldactone/lasix @ 100/40 dosing--Usually ascites of malignancy doesnt respond to diuresis- ECHO from 03/2015 not specific for CHF LFT's show AST/ALT 2:1 ETOH or NASH related-rpt LFT + get INR am.  Also Viral Hep Acute titers  Bilat DVT/ + PE 03/16/15 admission-Lifelong lovenox. COPD-NO wheeze-hold nebs Macrocytic anemia    Code Status: Full Family Communication: None at bedside Disposition Plan: Inpatient pending resolution/disposition-need to wait on ascitic pathology to make a determination as to what to do.   Verneita Griffes, MD  [facility] Pager [phone] 04/15/2015, 11:45 AM

## 2015-04-15 NOTE — Procedures (Signed)
Successful US guided paracentesis from RLQ.  Yielded 2.4L of clear yellow fluid.  No immediate complications.  Pt tolerated well.   Specimen was sent for labs.  Ascencion Dike PA-C 04/15/2015 1:50 PM

## 2015-04-16 LAB — CBC WITH DIFFERENTIAL/PLATELET
Basophils Absolute: 0 10*3/uL (ref 0.0–0.1)
Basophils Relative: 1 % (ref 0–1)
Eosinophils Absolute: 0.1 10*3/uL (ref 0.0–0.7)
Eosinophils Relative: 2 % (ref 0–5)
HCT: 27.2 % — ABNORMAL LOW (ref 39.0–52.0)
HEMOGLOBIN: 9.3 g/dL — AB (ref 13.0–17.0)
LYMPHS PCT: 43 % (ref 12–46)
Lymphs Abs: 2.4 10*3/uL (ref 0.7–4.0)
MCH: 34.4 pg — ABNORMAL HIGH (ref 26.0–34.0)
MCHC: 34.2 g/dL (ref 30.0–36.0)
MCV: 100.7 fL — AB (ref 78.0–100.0)
Monocytes Absolute: 0.7 10*3/uL (ref 0.1–1.0)
Monocytes Relative: 13 % — ABNORMAL HIGH (ref 3–12)
NEUTROS ABS: 2.3 10*3/uL (ref 1.7–7.7)
NEUTROS PCT: 41 % — AB (ref 43–77)
Platelets: 216 10*3/uL (ref 150–400)
RBC: 2.7 MIL/uL — ABNORMAL LOW (ref 4.22–5.81)
RDW: 14.9 % (ref 11.5–15.5)
WBC: 5.5 10*3/uL (ref 4.0–10.5)

## 2015-04-16 LAB — COMPREHENSIVE METABOLIC PANEL
ALT: 27 U/L (ref 17–63)
ANION GAP: 6 (ref 5–15)
AST: 45 U/L — ABNORMAL HIGH (ref 15–41)
Albumin: 1.5 g/dL — ABNORMAL LOW (ref 3.5–5.0)
Alkaline Phosphatase: 87 U/L (ref 38–126)
CHLORIDE: 101 mmol/L (ref 101–111)
CO2: 27 mmol/L (ref 22–32)
Calcium: 7.2 mg/dL — ABNORMAL LOW (ref 8.9–10.3)
Creatinine, Ser: 0.68 mg/dL (ref 0.61–1.24)
GFR calc Af Amer: >60 mL/min (ref >60)
Glucose, Bld: 124 mg/dL — ABNORMAL HIGH (ref 65–99)
Potassium: 4 mmol/L (ref 3.5–5.1)
Sodium: 134 mmol/L — ABNORMAL LOW (ref 135–145)
TOTAL PROTEIN: 3.5 g/dL — AB (ref 6.5–8.1)
Total Bilirubin: 0.3 mg/dL (ref 0.3–1.2)

## 2015-04-16 LAB — PROTIME-INR
INR: 1.28 (ref 0.00–1.49)
Prothrombin Time: 16.2 seconds — ABNORMAL HIGH (ref 11.6–15.2)

## 2015-04-16 MED ORDER — ALBUMIN HUMAN 25 % IV SOLN
50.0000 g | Freq: Once | INTRAVENOUS | Status: AC
  Administered 2015-04-16: 50 g via INTRAVENOUS
  Filled 2015-04-16: qty 200

## 2015-04-16 MED ORDER — DIPHENHYDRAMINE HCL 25 MG PO CAPS
25.0000 mg | ORAL_CAPSULE | Freq: Once | ORAL | Status: AC
  Administered 2015-04-16: 25 mg via ORAL
  Filled 2015-04-16: qty 1

## 2015-04-16 MED ORDER — OXYCODONE HCL 5 MG PO TABS
10.0000 mg | ORAL_TABLET | ORAL | Status: DC
Start: 2015-04-16 — End: 2015-04-23
  Administered 2015-04-16 – 2015-04-23 (×40): 10 mg via ORAL
  Filled 2015-04-16 (×42): qty 2

## 2015-04-16 NOTE — Progress Notes (Signed)
Patrick Lee [id number] DOB: [birth year] DOA: 04/14/2015 PCP: [name]  Brief narrative:  54 y/o ? known CAD-neg Cardiolite 2004, EF 46% 04/10/11, [facility] for Panc Ca 06/25/06-ChemoRx for + nodes former smoker, h/o RLL lung ca s/p wedge resection of superior segment lung lesion, right lower lobectomy by Dr. Cyndia Bent on 04/08/2012, recent  Acute PE and LE DVT on Lovenox admitted with anasarca from [place] me he used to drink occasionally only-Once a week per him No h/o blood transfusions  Past medical history-As per Problem list Chart reviewed as below-   Consultants:  None yet  Procedures:  Paracentesis abd 6/11  Antibiotics:   none yet   Subjective   Fair Pain ~7-8/10 No n/v   Objective     Objective: Filed Vitals:   04/15/15 1330 04/15/15 2202 04/16/15 0504 04/16/15 1232  BP: 94/68 1'08/75 96/66 90/58 '$  Pulse:  99 91 92  Temp:  98 F (36.7 C) 97.9 F (36.6 C) 98.7 F (37.1 C)  TempSrc:  Oral Oral Oral  Resp:  '18 17 18  '$ Height:      Weight:   65.862 kg (145 lb 3.2 oz)   SpO2:  100% 100% 100%    Intake/Output Summary (Last 24 hours) at 04/16/15 1337 Last data filed at 04/16/15 1317  Gross per 24 hour  Intake      0 ml  Output   1501 ml  Net  -1501 ml    Exam:  General: frail, eomi, ncat Cardiovascular: s1 s 2no m/r/g Respiratory: clear no added sound Abdomen: soft-midline scar.  No rebound no guard. Cannot appreciate shift dullness Skin anicteric  Data Reviewed: Basic Metabolic Panel:  Recent Labs Lab 04/14/15 1607 04/14/15 1613 04/16/15 0350  NA 136 136 134*  K 4.2 4.2 4.0  CL 109 104 101  CO2 22  --  27  GLUCOSE 74 71 124*  BUN <5* <3* <5*  CREATININE 0.67 0.60* 0.68  CALCIUM 7.4*  --  7.2*   Liver Function Tests:  Recent Labs Lab 04/14/15 1607 04/16/15 0350  AST 51* 45*  ALT 27 27  ALKPHOS 112 87  BILITOT 0.4 0.3  PROT 4.5* 3.5*  ALBUMIN 1.9* 1.5*    Recent Labs Lab  04/14/15 1607  LIPASE 11*   No results for input(s): AMMONIA in the last 168 hours. CBC:  Recent Labs Lab 04/14/15 1607 04/14/15 1613 04/16/15 0350  WBC 6.6  --  5.5  NEUTROABS 3.1  --  2.3  HGB 11.3* 11.9* 9.3*  HCT 32.6* 35.0* 27.2*  MCV 104.8*  --  100.7*  PLT 205  --  216   Cardiac Enzymes: No results for input(s): CKTOTAL, CKMB, CKMBINDEX, TROPONINI in the last 168 hours. BNP: Invalid input(s): POCBNP CBG: No results for input(s): GLUCAP in the last 168 hours.  Recent Results (from the past 240 hour(s))  Anaerobic culture     Status: None (Preliminary result)   Collection Time: 04/15/15  1:53 PM  Result Value Ref Range Status   Specimen Description PERITONEAL CAVITY  Final   Special Requests NONE  Final   Gram Stain PENDING  Incomplete   Culture   Final    NO ANAEROBES ISOLATED; CULTURE IN PROGRESS FOR 5 DAYS Performed at [facility]    Report Status PENDING  Incomplete  Body fluid culture     Status: None (Preliminary result)   Collection Time: 04/15/15  1:54 PM  Result Value Ref  Range Status   Specimen Description PERITONEAL CAVITY  Final   Special Requests NONE  Final   Gram Stain   Final    NO WBC SEEN NO ORGANISMS SEEN Performed at [facility]    Culture NO GROWTH Performed at [facility]   Final   Report Status PENDING  Incomplete     Studies:              All Imaging reviewed and is as per above notation   Scheduled Meds: . enoxaparin (LOVENOX) injection  100 mg Subcutaneous Q24H  . furosemide  40 mg Oral Daily  . sodium chloride  3 mL Intravenous Q12H  . spironolactone  100 mg Oral Daily   Continuous Infusions:    Assessment/Plan:  New onset Ascites-SAAG (gm/dL) <1.1--Consistent with carcinomatosis, tuberculosis, pancreatitis, serositis, or nephrosis.   ?pancreatic malignancy recurrence.  His h/o weight loss serves as a non-specific but surrogate symptom.   Await cytology-if + will d/w GI and Oncology for  work-up  Diuresing-keep aldactone/lasix @ 100/40 dosing--Usually ascites of malignancy doesnt respond to diuresis ECHO from 03/2015 not specific for CHF -rpt LFT=2:1 pattern consistent with ETOH abuse +  INR no ?  Monitor Viral Hep Acute titers Abd pain-schedule oxycodone, not prn for now  Third spacing and hypotensionGive Albumin 50 mg now Bilat DVT/ + PE 03/16/15 admission-Lifelong lovenox. COPD-NO wheeze-hold nebs Macrocytic anemia-stable    Code Status: Full Family Communication: None at bedside Disposition Plan: await cytology and potentially d/c for OP work-up  Verneita Griffes, MD  [facility] Pager [phone] 04/16/2015, 1:37 PM    LOS: 1 day

## 2015-04-17 LAB — CBC WITH DIFFERENTIAL/PLATELET
Basophils Absolute: 0.1 10*3/uL (ref 0.0–0.1)
Basophils Relative: 1 % (ref 0–1)
EOS ABS: 0.1 10*3/uL (ref 0.0–0.7)
EOS PCT: 1 % (ref 0–5)
HEMATOCRIT: 26.5 % — AB (ref 39.0–52.0)
Hemoglobin: 9.3 g/dL — ABNORMAL LOW (ref 13.0–17.0)
Lymphocytes Relative: 42 % (ref 12–46)
Lymphs Abs: 2.8 10*3/uL (ref 0.7–4.0)
MCH: 35.2 pg — AB (ref 26.0–34.0)
MCHC: 35.1 g/dL (ref 30.0–36.0)
MCV: 100.4 fL — ABNORMAL HIGH (ref 78.0–100.0)
MONOS PCT: 10 % (ref 3–12)
Monocytes Absolute: 0.7 10*3/uL (ref 0.1–1.0)
NEUTROS ABS: 3.2 10*3/uL (ref 1.7–7.7)
Neutrophils Relative %: 46 % (ref 43–77)
Platelets: 208 10*3/uL (ref 150–400)
RBC: 2.64 MIL/uL — ABNORMAL LOW (ref 4.22–5.81)
RDW: 14.9 % (ref 11.5–15.5)
WBC: 6.8 10*3/uL (ref 4.0–10.5)

## 2015-04-17 LAB — COMPREHENSIVE METABOLIC PANEL
ALBUMIN: 2.1 g/dL — AB (ref 3.5–5.0)
ALT: 24 U/L (ref 17–63)
AST: 43 U/L — AB (ref 15–41)
Alkaline Phosphatase: 76 U/L (ref 38–126)
Anion gap: 4 — ABNORMAL LOW (ref 5–15)
BUN: <5 mg/dL — ABNORMAL LOW (ref 6–20)
CALCIUM: 7.5 mg/dL — AB (ref 8.9–10.3)
CO2: 28 mmol/L (ref 22–32)
CREATININE: 0.6 mg/dL — AB (ref 0.61–1.24)
Chloride: 103 mmol/L (ref 101–111)
GFR calc Af Amer: >60 mL/min (ref >60)
GFR calc non Af Amer: >60 mL/min (ref >60)
Glucose, Bld: 94 mg/dL (ref 65–99)
Potassium: 3.9 mmol/L (ref 3.5–5.1)
Sodium: 135 mmol/L (ref 135–145)
Total Bilirubin: 0.6 mg/dL (ref 0.3–1.2)
Total Protein: 4 g/dL — ABNORMAL LOW (ref 6.5–8.1)

## 2015-04-17 LAB — HEPATITIS PANEL, ACUTE
HCV Ab: <0.1 s/co ratio (ref 0.0–0.9)
HEP A IGM: NEGATIVE
HEP B C IGM: NEGATIVE
HEP B S AG: NEGATIVE

## 2015-04-17 LAB — PATHOLOGIST SMEAR REVIEW

## 2015-04-17 LAB — PROTIME-INR
INR: 1.35 (ref 0.00–1.49)
PROTHROMBIN TIME: 16.8 s — AB (ref 11.6–15.2)

## 2015-04-17 NOTE — Progress Notes (Signed)
Patrick Lee OYD:[id number] DOB: Feb 28, 1961 DOA: 04/14/2015 PCP: [place] NP  Brief narrative:  54 y/o ? known CAD-neg Cardiolite 2004, EF 46% 04/10/11, [facility] for Panc Ca 06/25/06-ChemoRx for + nodes former smoker, h/o RLL lung ca s/p wedge resection of superior segment lung lesion, right lower lobectomy by Dr. Cyndia Bent on 04/08/2012, recent  Acute PE and LE DVT on Lovenox admitted with anasarca from [place] he used to drink occasionally only-Once a week per him No h/o blood transfusions  Past medical history-As per Problem list Chart reviewed as below-  Consultants:  None yet  Procedures:  Paracentesis abd 6/11  Antibiotics:   none yet   Subjective   Well No new issues tol diet   Objective     Objective: Filed Vitals:   04/16/15 1232 04/16/15 2238 04/16/15 2312 04/17/15 0548  BP: 90/58 97/64  98/58  Pulse: 92 110  83  Temp: 98.7 F (37.1 C) 101 F (38.3 C) 99.9 F (37.7 C) 98.5 F (36.9 C)  TempSrc: Oral Oral    Resp: '18 17  16  '$ Height:      Weight:    66.7 kg (147 lb 0.8 oz)  SpO2: 100% 100%  97%    Intake/Output Summary (Last 24 hours) at 04/17/15 1214 Last data filed at 04/17/15 0949  Gross per 24 hour  Intake    240 ml  Output   1950 ml  Net  -1710 ml    Exam:  General: frail, eomi, ncat Cardiovascular: s1 s 2no m/r/g Respiratory: clear no added sound Abdomen: soft-midline scar.  No rebound no guard. Cannot appreciate shift dullness Skin anicteric  Data Reviewed: Basic Metabolic Panel:  Recent Labs Lab 04/14/15 1607 04/14/15 1613 04/16/15 0350 04/17/15 0453  NA 136 136 134* 135  K 4.2 4.2 4.0 3.9  CL 109 104 101 103  CO2 22  --  27 28  GLUCOSE 74 71 124* 94  BUN <5* <3* <5* <5*  CREATININE 0.67 0.60* 0.68 0.60*  CALCIUM 7.4*  --  7.2* 7.5*   Liver Function Tests:  Recent Labs Lab 04/14/15 1607 04/16/15 0350 04/17/15 0453  AST 51* 45* 43*  ALT '27 27 24  '$ ALKPHOS 112 87 76    BILITOT 0.4 0.3 0.6  PROT 4.5* 3.5* 4.0*  ALBUMIN 1.9* 1.5* 2.1*    Recent Labs Lab 04/14/15 1607  LIPASE 11*   No results for input(s): AMMONIA in the last 168 hours. CBC:  Recent Labs Lab 04/14/15 1607 04/14/15 1613 04/16/15 0350 04/17/15 0453  WBC 6.6  --  5.5 6.8  NEUTROABS 3.1  --  2.3 3.2  HGB 11.3* 11.9* 9.3* 9.3*  HCT 32.6* 35.0* 27.2* 26.5*  MCV 104.8*  --  100.7* 100.4*  PLT 205  --  216 208   Cardiac Enzymes: No results for input(s): CKTOTAL, CKMB, CKMBINDEX, TROPONINI in the last 168 hours. BNP: Invalid input(s): POCBNP CBG: No results for input(s): GLUCAP in the last 168 hours.  Recent Results (from the past 240 hour(s))  Anaerobic culture     Status: None (Preliminary result)   Collection Time: 04/15/15  1:53 PM  Result Value Ref Range Status   Specimen Description PERITONEAL CAVITY  Final   Special Requests NONE  Final   Gram Stain   Final    NO WBC SEEN NO SQUAMOUS EPITHELIAL CELLS SEEN NO ORGANISMS SEEN Performed at [facility]    Culture   Final  NO ANAEROBES ISOLATED; CULTURE IN PROGRESS FOR 5 DAYS Performed at [facility]    Report Status PENDING  Incomplete  Body fluid culture     Status: None (Preliminary result)   Collection Time: 04/15/15  1:54 PM  Result Value Ref Range Status   Specimen Description PERITONEAL CAVITY  Final   Special Requests NONE  Final   Gram Stain   Final    NO WBC SEEN NO ORGANISMS SEEN Performed at [facility]    Culture NO GROWTH Performed at [facility]   Final   Report Status PENDING  Incomplete     Studies:              All Imaging reviewed and is as per above notation   Scheduled Meds: . enoxaparin (LOVENOX) injection  100 mg Subcutaneous Q24H  . furosemide  40 mg Oral Daily  . oxyCODONE  10 mg Oral Q4H  . sodium chloride  3 mL Intravenous Q12H  . spironolactone  100 mg Oral Daily   Continuous Infusions:    Assessment/Plan:  New onset Ascites-   SAAG (gm/dL) <1.1--Consistent with carcinomatosis, tuberculosis, pancreatitis, serositis, or nephrosis. ? Pancreatic malignancy recurrence.  His h/o weight loss serves as a non-specific but surrogate symptom.   Benign cytology from probable CHF despite  Low SAAG  d/w [facility]  who will see patient for set-up of ascites care Viral Hep Acute titers are neg -6 liters minus  Diuresing-keep aldactone/lasix @ 100/40 dosing--Usually ascites of malignancy doesnt respond to diuresis  ECHO from 03/2015 not specific for CHF  -rpt LFT=2:1 pattern consistent with ETOH abuse +  INR not ?   Abd pain-schedule oxycodone, not prn for now  Third spacing and hypotension-Give Albumin 50 mg now Bilat DVT/ + PE 03/16/15 admission-Lifelong lovenox. COPD-NO wheeze-hold nebs Macrocytic anemia-stable    Code Status: Full Family Communication: None at bedside Disposition Plan: awpotentially d/c for OP work-up in 24-48 hrs  Verneita Griffes, MD  [facility] Pager [phone] 04/17/2015, 12:14 PM    LOS: 2 days

## 2015-04-18 LAB — COMPREHENSIVE METABOLIC PANEL
ALT: 28 U/L (ref 17–63)
AST: 55 U/L — ABNORMAL HIGH (ref 15–41)
Albumin: 1.9 g/dL — ABNORMAL LOW (ref 3.5–5.0)
Alkaline Phosphatase: 83 U/L (ref 38–126)
Anion gap: 7 (ref 5–15)
BILIRUBIN TOTAL: 1 mg/dL (ref 0.3–1.2)
BUN: <5 mg/dL — ABNORMAL LOW (ref 6–20)
CO2: 26 mmol/L (ref 22–32)
Calcium: 7.5 mg/dL — ABNORMAL LOW (ref 8.9–10.3)
Chloride: 99 mmol/L — ABNORMAL LOW (ref 101–111)
Creatinine, Ser: 0.7 mg/dL (ref 0.61–1.24)
GFR calc Af Amer: >60 mL/min (ref >60)
Glucose, Bld: 91 mg/dL (ref 65–99)
Potassium: 4 mmol/L (ref 3.5–5.1)
Sodium: 132 mmol/L — ABNORMAL LOW (ref 135–145)
Total Protein: 3.9 g/dL — ABNORMAL LOW (ref 6.5–8.1)

## 2015-04-18 LAB — CBC WITH DIFFERENTIAL/PLATELET
BASOS ABS: 0.1 10*3/uL (ref 0.0–0.1)
Basophils Relative: 1 % (ref 0–1)
EOS ABS: 0 10*3/uL (ref 0.0–0.7)
EOS PCT: 0 % (ref 0–5)
HEMATOCRIT: 26.6 % — AB (ref 39.0–52.0)
HEMOGLOBIN: 9.5 g/dL — AB (ref 13.0–17.0)
Lymphocytes Relative: 41 % (ref 12–46)
Lymphs Abs: 4.1 10*3/uL — ABNORMAL HIGH (ref 0.7–4.0)
MCH: 35.2 pg — ABNORMAL HIGH (ref 26.0–34.0)
MCHC: 35.7 g/dL (ref 30.0–36.0)
MCV: 98.5 fL (ref 78.0–100.0)
Monocytes Absolute: 1.2 10*3/uL — ABNORMAL HIGH (ref 0.1–1.0)
Monocytes Relative: 12 % (ref 3–12)
Neutro Abs: 4.6 10*3/uL (ref 1.7–7.7)
Neutrophils Relative %: 46 % (ref 43–77)
Platelets: 217 10*3/uL (ref 150–400)
RBC: 2.7 MIL/uL — ABNORMAL LOW (ref 4.22–5.81)
RDW: 14.7 % (ref 11.5–15.5)
WBC: 10.1 10*3/uL (ref 4.0–10.5)

## 2015-04-18 LAB — PH, BODY FLUID: PH, FLUID: 8

## 2015-04-18 LAB — CEA: CEA: 5.6 ng/mL — ABNORMAL HIGH (ref 0.0–4.7)

## 2015-04-18 LAB — AFP TUMOR MARKER: AFP TUMOR MARKER: 1.3 ng/mL (ref 0.0–8.3)

## 2015-04-18 LAB — CANCER ANTIGEN 19-9: CA 19 9: 78 U/mL — AB (ref 0–35)

## 2015-04-18 NOTE — Progress Notes (Signed)
Patrick Lee [id number]:[id number] DOB: 10/26/1961 DOA: 04/14/2015 PCP: [place] NP  Brief narrative:  54 y/o ? known CAD-neg Cardiolite 2004, EF 46% 04/10/11, [facility] for Panc Ca 06/25/06-ChemoRx for + nodes former smoker, h/o RLL lung ca s/p wedge resection of superior segment lung lesion, right lower lobectomy by Dr. Cyndia Bent on 04/08/2012, recent  Acute PE and LE DVT on Lovenox admitted with anasarca from [place] me he used to drink occasionally only-Once a week per him No h/o blood transfusions  Past medical history-As per Problem list Chart reviewed as below-  Consultants:  None yet  Procedures:  Paracentesis abd 6/11  Antibiotics:   none yet   Subjective   Well  No issues currently Tolerating diet No cp/n/v Didn't like the food today No dark stool or tarry stool   Objective     Objective: Filed Vitals:   04/18/15 0542 04/18/15 0605 04/18/15 0853 04/18/15 1327  BP: '78/48 85/52 98/58 '$ 93/64  Pulse: 113 117  118  Temp: 100.9 F (38.3 C) 99.5 F (37.5 C)  97.9 F (36.6 C)  TempSrc: Oral Oral  Oral  Resp: 16   18  Height:      Weight: 63.3 kg (139 lb 8.8 oz)     SpO2: 97%   100%    Intake/Output Summary (Last 24 hours) at 04/18/15 1518 Last data filed at 04/18/15 1328  Gross per 24 hour  Intake    960 ml  Output   3500 ml  Net  -2540 ml    Exam:  General: frail, eomi, ncat Cardiovascular: s1 s 2no m/r/g Respiratory: clear no added sound Abdomen: soft-midline scar.  No rebound no guard. Cannot appreciate shift dullness Skin anicteric  Data Reviewed: Basic Metabolic Panel:  Recent Labs Lab 04/14/15 1607 04/14/15 1613 04/16/15 0350 04/17/15 0453 04/18/15 0420  NA 136 136 134* 135 132*  K 4.2 4.2 4.0 3.9 4.0  CL 109 104 101 103 99*  CO2 22  --  '27 28 26  '$ GLUCOSE 74 71 124* 94 91  BUN <5* <3* <5* <5* <5*  CREATININE 0.67 0.60* 0.68 0.60* 0.70  CALCIUM 7.4*  --  7.2* 7.5* 7.5*   Liver Function  Tests:  Recent Labs Lab 04/14/15 1607 04/16/15 0350 04/17/15 0453 04/18/15 0420  AST 51* 45* 43* 55*  ALT '27 27 24 28  '$ ALKPHOS 112 87 76 83  BILITOT 0.4 0.3 0.6 1.0  PROT 4.5* 3.5* 4.0* 3.9*  ALBUMIN 1.9* 1.5* 2.1* 1.9*    Recent Labs Lab 04/14/15 1607  LIPASE 11*   No results for input(s): AMMONIA in the last 168 hours. CBC:  Recent Labs Lab 04/14/15 1607 04/14/15 1613 04/16/15 0350 04/17/15 0453 04/18/15 0420  WBC 6.6  --  5.5 6.8 10.1  NEUTROABS 3.1  --  2.3 3.2 4.6  HGB 11.3* 11.9* 9.3* 9.3* 9.5*  HCT 32.6* 35.0* 27.2* 26.5* 26.6*  MCV 104.8*  --  100.7* 100.4* 98.5  PLT 205  --  216 208 217   Cardiac Enzymes: No results for input(s): CKTOTAL, CKMB, CKMBINDEX, TROPONINI in the last 168 hours. BNP: Invalid input(s): POCBNP CBG: No results for input(s): GLUCAP in the last 168 hours.  Recent Results (from the past 240 hour(s))  Anaerobic culture     Status: None (Preliminary result)   Collection Time: 04/15/15  1:53 PM  Result Value Ref Range Status   Specimen Description PERITONEAL CAVITY  Final   Special Requests NONE  Final   Gram Stain   Final    NO WBC SEEN NO SQUAMOUS EPITHELIAL CELLS SEEN NO ORGANISMS SEEN Performed at [facility]    Culture   Final    NO ANAEROBES ISOLATED; CULTURE IN PROGRESS FOR 5 DAYS Performed at [facility]    Report Status PENDING  Incomplete  Body fluid culture     Status: None (Preliminary result)   Collection Time: 04/15/15  1:54 PM  Result Value Ref Range Status   Specimen Description PERITONEAL CAVITY  Final   Special Requests NONE  Final   Gram Stain   Final    NO WBC SEEN NO ORGANISMS SEEN Performed at [facility]    Culture   Final    NO GROWTH 2 DAYS Performed at [facility]    Report Status PENDING  Incomplete     Studies:              All Imaging reviewed and is as per above notation   Scheduled Meds: . enoxaparin (LOVENOX) injection  100 mg Subcutaneous  Q24H  . furosemide  40 mg Oral Daily  . oxyCODONE  10 mg Oral Q4H  . sodium chloride  3 mL Intravenous Q12H  . spironolactone  100 mg Oral Daily   Continuous Infusions:    Assessment/Plan:  New onset Ascites-  SAAG (gm/dL) <1.1--Consistent with carcinomatosis, tuberculosis, pancreatitis, serositis, or nephrosis. ? Pancreatic malignancy recurrence.  His h/o weight loss serves as a non-specific but surrogate symptom.   Benign cytology from probable CHF despite  Low SAAG  d/w Dr Laural Golden who will see patientin [place] for ascites care Viral Hep Acute titers are neg - 8.5 liters minus  Diuresing-keep aldactone/lasix @ 100/40 dosing--Usually ascites of malignancy doesnt respond to diuresis but his has  ECHO from 03/2015 not specific for CHF  -rpt LFT=2:1 pattern consistent with ETOH abuse +  INR not ?   Abd pain-schedule oxycodone, not prn for now  Third spacing and hypotension-Given Albumin 50 mg this admission Bilat DVT/ + PE 03/16/15 admission-Lifelong lovenox.  PLT's are wnl COPD-NO wheeze-hold nebs Constitutiona hypotension-stable Macrocytic anemia-stable    Code Status: Full Family Communication: None at bedside Disposition Plan: potentially d/c for OP work-up in 24 hrs  Verneita Griffes, MD  [facility] Pager [phone] 04/18/2015, 3:18 PM    LOS: 3 days

## 2015-04-18 NOTE — Progress Notes (Signed)
Pt's oral temp = 101.1, BP=87/53 at 2135; paged on call; order has been made to check pt's BP manually and page MD if SBP lower than 87 ; recheck manual BP=92/58 at 2202. Pt is asymptomatic. Will  Continue to monitor pt.

## 2015-04-19 ENCOUNTER — Inpatient Hospital Stay (HOSPITAL_COMMUNITY): Payer: Medicare Other

## 2015-04-19 LAB — COMPREHENSIVE METABOLIC PANEL
ALBUMIN: 2 g/dL — AB (ref 3.5–5.0)
ALK PHOS: 79 U/L (ref 38–126)
ALT: 31 U/L (ref 17–63)
AST: 60 U/L — ABNORMAL HIGH (ref 15–41)
Anion gap: 8 (ref 5–15)
BUN: <5 mg/dL — ABNORMAL LOW (ref 6–20)
CHLORIDE: 98 mmol/L — AB (ref 101–111)
CO2: 24 mmol/L (ref 22–32)
Calcium: 7.5 mg/dL — ABNORMAL LOW (ref 8.9–10.3)
Creatinine, Ser: 0.69 mg/dL (ref 0.61–1.24)
GFR calc non Af Amer: >60 mL/min (ref >60)
Glucose, Bld: 88 mg/dL (ref 65–99)
POTASSIUM: 3.9 mmol/L (ref 3.5–5.1)
SODIUM: 130 mmol/L — AB (ref 135–145)
Total Bilirubin: 1.1 mg/dL (ref 0.3–1.2)
Total Protein: 3.7 g/dL — ABNORMAL LOW (ref 6.5–8.1)

## 2015-04-19 LAB — CBC WITH DIFFERENTIAL/PLATELET
Basophils Absolute: 0.1 10*3/uL (ref 0.0–0.1)
Basophils Relative: 1 % (ref 0–1)
Eosinophils Absolute: 0 10*3/uL (ref 0.0–0.7)
Eosinophils Relative: 0 % (ref 0–5)
HCT: 25.2 % — ABNORMAL LOW (ref 39.0–52.0)
Hemoglobin: 8.9 g/dL — ABNORMAL LOW (ref 13.0–17.0)
Lymphocytes Relative: 36 % (ref 12–46)
Lymphs Abs: 4.5 10*3/uL — ABNORMAL HIGH (ref 0.7–4.0)
MCH: 34.8 pg — ABNORMAL HIGH (ref 26.0–34.0)
MCHC: 35.3 g/dL (ref 30.0–36.0)
MCV: 98.4 fL (ref 78.0–100.0)
Monocytes Absolute: 1.3 10*3/uL — ABNORMAL HIGH (ref 0.1–1.0)
Monocytes Relative: 10 % (ref 3–12)
NEUTROS ABS: 6.7 10*3/uL (ref 1.7–7.7)
NEUTROS PCT: 53 % (ref 43–77)
PLATELETS: 218 10*3/uL (ref 150–400)
RBC: 2.56 MIL/uL — ABNORMAL LOW (ref 4.22–5.81)
RDW: 14.6 % (ref 11.5–15.5)
WBC: 12.5 10*3/uL — ABNORMAL HIGH (ref 4.0–10.5)

## 2015-04-19 LAB — BODY FLUID CULTURE
Culture: NO GROWTH
Gram Stain: NONE SEEN

## 2015-04-19 MED ORDER — ALBUMIN HUMAN 25 % IV SOLN
50.0000 g | Freq: Once | INTRAVENOUS | Status: AC
  Administered 2015-04-19: 50 g via INTRAVENOUS
  Filled 2015-04-19: qty 200

## 2015-04-19 MED ORDER — METRONIDAZOLE 500 MG PO TABS
500.0000 mg | ORAL_TABLET | Freq: Three times a day (TID) | ORAL | Status: DC
  Administered 2015-04-19 – 2015-04-23 (×13): 500 mg via ORAL
  Filled 2015-04-19 (×13): qty 1

## 2015-04-19 MED ORDER — ALBUMIN HUMAN 25 % IV SOLN
12.5000 g | Freq: Once | INTRAVENOUS | Status: AC
  Administered 2015-04-19: 12.5 g via INTRAVENOUS
  Filled 2015-04-19: qty 50

## 2015-04-19 NOTE — Plan of Care (Signed)
Problem: Food- and Nutrition-Related Knowledge Deficit (NB-1.1) Goal: Nutrition education Formal process to instruct or train a patient/client in a skill or to impart knowledge to help patients/clients voluntarily manage or modify food choices and eating behavior to maintain or improve health. Outcome: Adequate for Discharge Received consult for diet education for low sodium diet. Provided pt with handout "Heart Failure Nutrition Therapy" by the Academy of [employer]. Walked pt through handout explaining why it is important to restrict sodium, main dietary sources of sodium and label reading tips. Diet recall shows pt consuming several high sodium foods, such as bacon, hot dogs and other highly processed foods, but not daily. Encouraged to choosing low sodium varieties and eating less of these items whenever possible.  Pt states that the only thing he has been avoiding is high fat foods. Pt cans his own vegetables, suggested rinsing them before use. Pt cooks most of his meals, including the use of venison as his primary meat, gave suggestions for no sodium seasonings. Pt states he likes Mrs Deliah Boston and similar seasonings and feels he can replace his salt with it without much trouble. Pt also admits to drinking 3-4 Cokes and 1-2 quarts of water a day. Encouraged pt to limit his intake to one coke per day and choose water, while also being aware of the amount of water consumed, gave couple suggestions to do that. Provided tips to reduce thirst.  Pt grateful for the visit, pt verbalized understanding, teach-back method used, expect good compliance.  Suheyla Mortellaro [facility] Dietetic Intern Pager: [phone] 04/19/2015 11:12 AM

## 2015-04-19 NOTE — Progress Notes (Signed)
Patrick Lee ZOX:[id number] DOB: 12-Aug-1961 DOA: 04/14/2015 PCP: [place] NP  Brief narrative:  54 y/o ? known CAD-neg Cardiolite 2004, EF 46% 04/10/11, [facility] for Panc Ca 06/25/06-ChemoRx for + nodes former smoker, h/o RLL lung ca s/p wedge resection of superior segment lung lesion, right lower lobectomy by Dr. Cyndia Bent on 04/08/2012, recent  Acute PE and LE DVT on Lovenox admitted with anasarca from [place] me he used to drink occasionally only-Once a week per him No h/o blood transfusions  Past medical history-As per Problem list Chart reviewed as below-  Consultants:  None yet  Procedures:  Paracentesis abd 6/11  Antibiotics:   none yet   Subjective   Fever overnight 3 loose stools as well No n/v No significant abd swelling No current abd pain Not really hungry Note blood pressure was low as well    Objective     Objective: Filed Vitals:   04/19/15 0845 04/19/15 0848 04/19/15 1041 04/19/15 1335  BP: '80/46 90/50 94/58 '$ 87/59  Pulse:  107  117  Temp:  98.4 F (36.9 C)  98.4 F (36.9 C)  TempSrc:  Oral  Oral  Resp:  18    Height:      Weight:      SpO2:  96%  96%    Intake/Output Summary (Last 24 hours) at 04/19/15 1707 Last data filed at 04/19/15 0600  Gross per 24 hour  Intake    533 ml  Output    800 ml  Net   -267 ml    Exam:  General: frail, eomi, ncat Cardiovascular: s1 s 2no m/r/g Respiratory: clear no added sound Abdomen: soft-midline scar.  No rebound no guard. Cannot appreciate shift dullness Skin anicteric  Data Reviewed: Basic Metabolic Panel:  Recent Labs Lab 04/14/15 1607 04/14/15 1613 04/16/15 0350 04/17/15 0453 04/18/15 0420 04/19/15 0515  NA 136 136 134* 135 132* 130*  K 4.2 4.2 4.0 3.9 4.0 3.9  CL 109 104 101 103 99* 98*  CO2 22  --  '27 28 26 24  '$ GLUCOSE 74 71 124* 94 91 88  BUN <5* <3* <5* <5* <5* <5*  CREATININE 0.67 0.60* 0.68 0.60* 0.70 0.69  CALCIUM 7.4*  --  7.2* 7.5*  7.5* 7.5*   Liver Function Tests:  Recent Labs Lab 04/14/15 1607 04/16/15 0350 04/17/15 0453 04/18/15 0420 04/19/15 0515  AST 51* 45* 43* 55* 60*  ALT '27 27 24 28 31  '$ ALKPHOS 112 87 76 83 79  BILITOT 0.4 0.3 0.6 1.0 1.1  PROT 4.5* 3.5* 4.0* 3.9* 3.7*  ALBUMIN 1.9* 1.5* 2.1* 1.9* 2.0*    Recent Labs Lab 04/14/15 1607  LIPASE 11*   No results for input(s): AMMONIA in the last 168 hours. CBC:  Recent Labs Lab 04/14/15 1607 04/14/15 1613 04/16/15 0350 04/17/15 0453 04/18/15 0420 04/19/15 0515  WBC 6.6  --  5.5 6.8 10.1 12.5*  NEUTROABS 3.1  --  2.3 3.2 4.6 6.7  HGB 11.3* 11.9* 9.3* 9.3* 9.5* 8.9*  HCT 32.6* 35.0* 27.2* 26.5* 26.6* 25.2*  MCV 104.8*  --  100.7* 100.4* 98.5 98.4  PLT 205  --  216 208 217 218   Cardiac Enzymes: No results for input(s): CKTOTAL, CKMB, CKMBINDEX, TROPONINI in the last 168 hours. BNP: Invalid input(s): POCBNP CBG: No results for input(s): GLUCAP in the last 168 hours.  Recent Results (from the past 240 hour(s))  Anaerobic culture     Status: None (Preliminary  result)   Collection Time: 04/15/15  1:53 PM  Result Value Ref Range Status   Specimen Description PERITONEAL CAVITY  Final   Special Requests NONE  Final   Gram Stain   Final    NO WBC SEEN NO SQUAMOUS EPITHELIAL CELLS SEEN NO ORGANISMS SEEN Performed at [facility]    Culture   Final    NO ANAEROBES ISOLATED; CULTURE IN PROGRESS FOR 5 DAYS Performed at [facility]    Report Status PENDING  Incomplete  Body fluid culture     Status: None   Collection Time: 04/15/15  1:54 PM  Result Value Ref Range Status   Specimen Description PERITONEAL CAVITY  Final   Special Requests NONE  Final   Gram Stain   Final    NO WBC SEEN NO ORGANISMS SEEN Performed at [facility]    Culture   Final    NO GROWTH 3 DAYS Performed at [facility]    Report Status 04/19/2015 FINAL  Final     Studies:              All Imaging reviewed and is as  per above notation   Scheduled Meds: . enoxaparin (LOVENOX) injection  100 mg Subcutaneous Q24H  . metroNIDAZOLE  500 mg Oral 3 times per day  . oxyCODONE  10 mg Oral Q4H  . sodium chloride  3 mL Intravenous Q12H   Continuous Infusions:    Assessment/Plan:  Fever -diarrhea x 3 -r/o Cdiff and start empriic flagyll po 500 tid No ascites on exam so hold abx and paracentesis for right now  New onset Ascites-  SAAG (gm/dL) <1.1--Consistent with carcinomatosis, tuberculosis, pancreatitis, serositis, or nephrosis. Unlikely Pancreatic malignancy recurrence.  Benign cytology from probable CHF despite  Low SAAG  d/w Dr Olevia Perches practice and will arrange to see patient in [place] for ascites care Viral Hep Acute titers are neg - 6.5 liters minus  Diuresing -held aldactone/lasix @ 100/40 dosing on 6/15 -Usually ascites of malignancy doesnt respond to diuresis but his has -Re-evaluate in am  ECHO from 03/2015 not specific for CHF  -rpt LFT=2:1 pattern consistent with ETOH abuse +  INR not ?   Abd pain-schedule oxycodone, not prn for now  Third spacing and hypotension-Given Albumin 50 mg this admission and repeated 04/19/15 Bilat DVT/ + PE 03/16/15 admission-Lifelong lovenox.  PLT's are wnl COPD-NO wheeze-hold nebs Constitutiona hypotension-stable Macrocytic anemia-stable    Code Status: Full Family Communication: None at bedside Disposition Plan: pending work-up  Verneita Griffes, MD  [facility] Pager [phone] 04/19/2015, 5:07 PM    LOS: 4 days

## 2015-04-20 LAB — CBC WITH DIFFERENTIAL/PLATELET
BASOS ABS: 0 10*3/uL (ref 0.0–0.1)
BASOS ABS: 0 10*3/uL (ref 0.0–0.1)
BASOS PCT: 0 % (ref 0–1)
Basophils Relative: 0 % (ref 0–1)
EOS ABS: 0 10*3/uL (ref 0.0–0.7)
Eosinophils Absolute: 0 10*3/uL (ref 0.0–0.7)
Eosinophils Relative: 0 % (ref 0–5)
Eosinophils Relative: 0 % (ref 0–5)
HEMATOCRIT: 21.4 % — AB (ref 39.0–52.0)
HEMATOCRIT: 25.9 % — AB (ref 39.0–52.0)
Hemoglobin: 7.7 g/dL — ABNORMAL LOW (ref 13.0–17.0)
Hemoglobin: 9.4 g/dL — ABNORMAL LOW (ref 13.0–17.0)
Lymphocytes Relative: 33 % (ref 12–46)
Lymphocytes Relative: 20 % (ref 12–46)
Lymphs Abs: 3 10*3/uL (ref 0.7–4.0)
Lymphs Abs: 2.2 10*3/uL (ref 0.7–4.0)
MCH: 35.2 pg — ABNORMAL HIGH (ref 26.0–34.0)
MCH: 36.4 pg — ABNORMAL HIGH (ref 26.0–34.0)
MCHC: 36 g/dL (ref 30.0–36.0)
MCHC: 36.3 g/dL — AB (ref 30.0–36.0)
MCV: 97.7 fL (ref 78.0–100.0)
MCV: 100.4 fL — AB (ref 78.0–100.0)
MONO ABS: 0.7 10*3/uL (ref 0.1–1.0)
Monocytes Absolute: 0.8 10*3/uL (ref 0.1–1.0)
Monocytes Relative: 9 % (ref 3–12)
Monocytes Relative: 6 % (ref 3–12)
NEUTROS ABS: 5.2 10*3/uL (ref 1.7–7.7)
NEUTROS PCT: 58 % (ref 43–77)
Neutro Abs: 8.1 10*3/uL — ABNORMAL HIGH (ref 1.7–7.7)
Neutrophils Relative %: 74 % (ref 43–77)
PLATELETS: 195 10*3/uL (ref 150–400)
PLATELETS: 231 10*3/uL (ref 150–400)
RBC: 2.19 MIL/uL — ABNORMAL LOW (ref 4.22–5.81)
RBC: 2.58 MIL/uL — ABNORMAL LOW (ref 4.22–5.81)
RDW: 14.5 % (ref 11.5–15.5)
RDW: 14.5 % (ref 11.5–15.5)
WBC: 9.1 10*3/uL (ref 4.0–10.5)
WBC: 10.9 10*3/uL — ABNORMAL HIGH (ref 4.0–10.5)

## 2015-04-20 LAB — COMPREHENSIVE METABOLIC PANEL
ALK PHOS: 63 U/L (ref 38–126)
ALT: 27 U/L (ref 17–63)
ANION GAP: 5 (ref 5–15)
AST: 49 U/L — ABNORMAL HIGH (ref 15–41)
Albumin: 2.3 g/dL — ABNORMAL LOW (ref 3.5–5.0)
BUN: <5 mg/dL — ABNORMAL LOW (ref 6–20)
CALCIUM: 7.7 mg/dL — AB (ref 8.9–10.3)
CO2: 27 mmol/L (ref 22–32)
Chloride: 102 mmol/L (ref 101–111)
Creatinine, Ser: 0.67 mg/dL (ref 0.61–1.24)
GFR calc non Af Amer: >60 mL/min (ref >60)
GLUCOSE: 66 mg/dL (ref 65–99)
POTASSIUM: 3.7 mmol/L (ref 3.5–5.1)
Sodium: 134 mmol/L — ABNORMAL LOW (ref 135–145)
TOTAL PROTEIN: 3.9 g/dL — AB (ref 6.5–8.1)
Total Bilirubin: 1.9 mg/dL — ABNORMAL HIGH (ref 0.3–1.2)

## 2015-04-20 LAB — ANAEROBIC CULTURE: GRAM STAIN: NONE SEEN

## 2015-04-20 NOTE — Progress Notes (Addendum)
Patrick Lee WCH:[id number] DOB: [birth year] DOA: 04/14/2015 PCP: [place] NP  Brief narrative:  54 y/o ? known CAD-neg Cardiolite 2004, EF 46% 04/10/11, Whipple for Panc Ca 06/25/06-ChemoRx for + nodes former smoker, h/o RLL lung ca s/p wedge resection of superior segment lung lesion, right lower lobectomy by Dr. Cyndia Bent on 04/08/2012, recent  Acute PE and LE DVT on Lovenox admitted with anasarca from [place] he used to drink occasionally only-Once a week per him No h/o blood transfusions  Past medical history-As per Problem list Chart reviewed as below-  Consultants:  None yet  Procedures:  Paracentesis abd 6/11  Antibiotics:   none yet   Subjective   Feels weak and lightheaded Ambulated but felt sleepy earlier No dark or tarry stool No cp No n/v     Objective     Objective: Filed Vitals:   04/19/15 1335 04/19/15 1830 04/19/15 2158 04/20/15 0624  BP: '87/59 96/60 92/59 '$ 80/54  Pulse: 117 105 108 117  Temp: 98.4 F (36.9 C)  99.3 F (37.4 C) 98.8 F (37.1 C)  TempSrc: Oral  Oral Oral  Resp:   16 16  Height:      Weight:    60.056 kg (132 lb 6.4 oz)  SpO2: 96% 96% 98% 94%    Intake/Output Summary (Last 24 hours) at 04/20/15 1522 Last data filed at 04/20/15 0600  Gross per 24 hour  Intake    680 ml  Output      0 ml  Net    680 ml    Exam:  General: frail, eomi, ncat Cardiovascular: s1 s 2no m/r/g Respiratory: clear no added sound Abdomen: soft-midline scar.  No rebound no guard. Skin anicteric  Data Reviewed: Basic Metabolic Panel:  Recent Labs Lab 04/16/15 0350 04/17/15 0453 04/18/15 0420 04/19/15 0515 04/20/15 0558  NA 134* 135 132* 130* 134*  K 4.0 3.9 4.0 3.9 3.7  CL 101 103 99* 98* 102  CO2 '27 28 26 24 27  '$ GLUCOSE 124* 94 91 88 66  BUN <5* <5* <5* <5* <5*  CREATININE 0.68 0.60* 0.70 0.69 0.67  CALCIUM 7.2* 7.5* 7.5* 7.5* 7.7*   Liver Function Tests:  Recent Labs Lab 04/16/15 0350  04/17/15 0453 04/18/15 0420 04/19/15 0515 04/20/15 0558  AST 45* 43* 55* 60* 49*  ALT '27 24 28 31 27  '$ ALKPHOS 87 76 83 79 63  BILITOT 0.3 0.6 1.0 1.1 1.9*  PROT 3.5* 4.0* 3.9* 3.7* 3.9*  ALBUMIN 1.5* 2.1* 1.9* 2.0* 2.3*    Recent Labs Lab 04/14/15 1607  LIPASE 11*   No results for input(s): AMMONIA in the last 168 hours. CBC:  Recent Labs Lab 04/16/15 0350 04/17/15 0453 04/18/15 0420 04/19/15 0515 04/20/15 0558  WBC 5.5 6.8 10.1 12.5* 9.1  NEUTROABS 2.3 3.2 4.6 6.7 5.2  HGB 9.3* 9.3* 9.5* 8.9* 7.7*  HCT 27.2* 26.5* 26.6* 25.2* 21.4*  MCV 100.7* 100.4* 98.5 98.4 97.7  PLT 216 208 217 218 195   Cardiac Enzymes: No results for input(s): CKTOTAL, CKMB, CKMBINDEX, TROPONINI in the last 168 hours. BNP: Invalid input(s): POCBNP CBG: No results for input(s): GLUCAP in the last 168 hours.  Recent Results (from the past 240 hour(s))  Anaerobic culture     Status: None   Collection Time: 04/15/15  1:53 PM  Result Value Ref Range Status   Specimen Description PERITONEAL CAVITY  Final   Special Requests NONE  Final   Gram  Stain   Final    NO WBC SEEN NO SQUAMOUS EPITHELIAL CELLS SEEN NO ORGANISMS SEEN Performed at [facility]    Culture   Final    NO ANAEROBES ISOLATED Performed at [facility]    Report Status 04/20/2015 FINAL  Final  Body fluid culture     Status: None   Collection Time: 04/15/15  1:54 PM  Result Value Ref Range Status   Specimen Description PERITONEAL CAVITY  Final   Special Requests NONE  Final   Gram Stain   Final    NO WBC SEEN NO ORGANISMS SEEN Performed at [facility]    Culture   Final    NO GROWTH 3 DAYS Performed at [facility]    Report Status 04/19/2015 FINAL  Final     Studies:              All Imaging reviewed and is as per above notation   Scheduled Meds: . enoxaparin (LOVENOX) injection  100 mg Subcutaneous Q24H  . metroNIDAZOLE  500 mg Oral 3 times per day  . oxyCODONE  10 mg  Oral Q4H  . sodium chloride  3 mL Intravenous Q12H   Continuous Infusions:    Assessment/Plan:  New onset Ascites-  SAAG (gm/dL) <1.1--Consistent with carcinomatosis, tuberculosis, pancreatitis, serositis, or nephrosis. ? Pancreatic malignancy recurrence.  His h/o weight loss serves as a non-specific but surrogate symptom.   Benign cytology from probable CHF despite  Low SAAG  d/w Dr Laural Golden who will see patientin [place] for ascites care Viral Hep Acute titers are neg - 8.5 liters minus  Diuresing-keep aldactone/lasix @ 100/40 dosing--Usually ascites of malignancy doesnt respond to diuresis but his has  ECHO from 03/2015 not specific for CHF  -rpt LFT=2:1 pattern consistent with ETOH abuse +  INR not ?   Abd pain-schedule oxycodone, not prn for now  Iatrogenic vs ABLA- had colonoscopy in [place] ~ 2-3 yr ago which was "normal"  We will type and screen him and if he remains below 8 I would transfuse him blood 2 U PRBC and re-check today.   Will hemoccult his stool as well to ensure no overt bleeding Fever? Sepsis multiple chest x-rays are negative and patient has had no further loose stool to indicate that patient has C. difficile Third spacing and hypotension-Given Albumin 50 mg this admission Bilat DVT/ + PE 03/16/15 admission-Lifelong lovenox.  PLT's are wnl COPD-No wheeze-hold nebs Constitutiona hypotension-stable Macrocytic anemia-stable    Code Status: Full Family Communication: None at bedside Disposition Plan: potentially d/c for OP work-up in 24 hrs  Verneita Griffes, MD  [facility] Pager [phone] 04/20/2015, 3:22 PM    LOS: 5 days

## 2015-04-21 DIAGNOSIS — Z8507 Personal history of malignant neoplasm of pancreas: Secondary | ICD-10-CM

## 2015-04-21 DIAGNOSIS — R601 Generalized edema: Secondary | ICD-10-CM

## 2015-04-21 DIAGNOSIS — I429 Cardiomyopathy, unspecified: Secondary | ICD-10-CM

## 2015-04-21 DIAGNOSIS — Z720 Tobacco use: Secondary | ICD-10-CM

## 2015-04-21 LAB — URINALYSIS, ROUTINE W REFLEX MICROSCOPIC
GLUCOSE, UA: NEGATIVE mg/dL
Hgb urine dipstick: NEGATIVE
Ketones, ur: 15 mg/dL — AB
Nitrite: POSITIVE — AB
PROTEIN: NEGATIVE mg/dL
Specific Gravity, Urine: 1.021 (ref 1.005–1.030)
UROBILINOGEN UA: 0.2 mg/dL (ref 0.0–1.0)
pH: 5.5 (ref 5.0–8.0)

## 2015-04-21 LAB — CBC WITH DIFFERENTIAL/PLATELET
BASOS ABS: 0 10*3/uL (ref 0.0–0.1)
Basophils Relative: 0 % (ref 0–1)
EOS ABS: 0 10*3/uL (ref 0.0–0.7)
Eosinophils Relative: 0 % (ref 0–5)
HEMATOCRIT: 21.6 % — AB (ref 39.0–52.0)
Hemoglobin: 7.7 g/dL — ABNORMAL LOW (ref 13.0–17.0)
LYMPHS ABS: 2.9 10*3/uL (ref 0.7–4.0)
LYMPHS PCT: 29 % (ref 12–46)
MCH: 35.5 pg — AB (ref 26.0–34.0)
MCHC: 35.6 g/dL (ref 30.0–36.0)
MCV: 99.5 fL (ref 78.0–100.0)
MONO ABS: 0.8 10*3/uL (ref 0.1–1.0)
Monocytes Relative: 7 % (ref 3–12)
Neutro Abs: 6.5 10*3/uL (ref 1.7–7.7)
Neutrophils Relative %: 64 % (ref 43–77)
Platelets: 190 10*3/uL (ref 150–400)
RBC: 2.17 MIL/uL — ABNORMAL LOW (ref 4.22–5.81)
RDW: 14.5 % (ref 11.5–15.5)
WBC: 10.2 10*3/uL (ref 4.0–10.5)

## 2015-04-21 LAB — URINE MICROSCOPIC-ADD ON

## 2015-04-21 LAB — PREPARE RBC (CROSSMATCH)

## 2015-04-21 MED ORDER — ONDANSETRON HCL 4 MG/2ML IJ SOLN
4.0000 mg | Freq: Four times a day (QID) | INTRAMUSCULAR | Status: DC | PRN
  Filled 2015-04-21: qty 2

## 2015-04-21 MED ORDER — ONDANSETRON HCL 4 MG/2ML IJ SOLN
4.0000 mg | Freq: Four times a day (QID) | INTRAMUSCULAR | Status: DC | PRN
  Administered 2015-04-21 – 2015-04-22 (×2): 4 mg via INTRAVENOUS
  Filled 2015-04-21: qty 2

## 2015-04-21 MED ORDER — ACETAMINOPHEN 325 MG PO TABS
650.0000 mg | ORAL_TABLET | Freq: Four times a day (QID) | ORAL | Status: DC | PRN
  Administered 2015-04-21: 650 mg via ORAL
  Filled 2015-04-21: qty 2

## 2015-04-21 MED ORDER — SODIUM CHLORIDE 0.9 % IV SOLN
INTRAVENOUS | Status: DC
  Administered 2015-04-21 – 2015-04-22 (×2): via INTRAVENOUS

## 2015-04-21 MED ORDER — SODIUM CHLORIDE 0.9 % IV SOLN
Freq: Once | INTRAVENOUS | Status: AC
  Administered 2015-04-21: 09:00:00 via INTRAVENOUS

## 2015-04-21 NOTE — Progress Notes (Signed)
[facility]                                                                              Patient Demographics  Patrick Lee, is a 54 y.o. male, DOB - Jul 09, 1961, EXB:[id number]  Admit date - 04/14/2015   Admitting Physician Deneise Lever, MD  Outpatient Primary MD for the patient is [name]  LOS - 6   Chief Complaint  Patient presents with  . Leg Swelling      HPI on 04/14/2015 by Dr. Shanda Howells This is a 54 y.o. year old male with significant past medical history of pancreatic cancer s/p whipple procedure, lung cancer, DVT and PE on lovenox, CAD presenting with anasarca. Patient reports progressive abdominal and lower extremity swelling over the past 2-3 weeks. Has seen his PCP about symptoms. Was placed on spironolactone for treatment. Patient reports her minimal improvement in symptoms. Denies any NSAID use. Reports decreased salt intake over the time frame. Denies any orthopnea or PND. Denies any alcohol abuse. Reports this is been a subacute on chronic issue for several months. Nonsmoker. Has been compliant with anticoagulation regimen. Presented to the ER afebrile, hemodynamically stable. CBC grossly within normal limits. CMP grossly within normal limits. Lipase within normal limits. BNP within normal limits at 52. CT of the abdomen and pelvis shows postsurgical changes secondary to will procedure. No findings of recurrent metastatic disease. Large volume abdominopelvic ascites and moderate right pleural effusion.  Assessment & Plan   New onset Ascites/third spacing -Etiology unknown, Possibly secondary to recurrent pancreatic malignncy -SAAG <1.1, ddx: carcinomatosis, tuberculosis, pancreatitis, serositis, or nephrosis -Hepatitis Panel negative -Continue diuresis, lasix and aldactone -Echcoardiogram 03/17/2015: EF 10-27%, diastolic function normal -Patient will follow up with Dr. Laural Golden in [place]  History of pancreatic cancer -CA 19-9:  78 -AFP 1.3 -CEA 5.6 -Patient follows with oncology in [place]  Iatrogenic vs Acute blood loss anemia -Patient had normal colonoscopy 2-3 years ago -Hemoglobin currently 7.7, will transfuse 1 unit -FOBT pending  SIRS/fever -CXR negative for infection  -Will obtain UA and blood cultures -Not entirely sure as to why patient is on flagyl ?intraabdominal pathology -Cdiff pending (however patient has no diarrhea) -Continue flagyl  Hypotension -Patient was given albumin, but seems to have chronically low BP -Continue to monitor closely  Bilateral DVT/PE -Continue lovenox  COPD -Stable, no wheezing  Code Status: Full  Family Communication: None at bedside  Disposition Plan: Admitted.  Expect d/c within the next 24-48hours  Time Spent in minutes   30 minutes  Procedures  Paracentesis 04/15/2015  Consults   Interventional radiology  DVT Prophylaxis  Lovenox  Lab Results  Component Value Date   PLT 190 04/21/2015    Medications  Scheduled Meds: . enoxaparin (LOVENOX) injection  100 mg Subcutaneous Q24H  . metroNIDAZOLE  500 mg Oral 3 times per day  . oxyCODONE  10 mg Oral Q4H  . sodium chloride  3 mL Intravenous Q12H   Continuous Infusions: . sodium chloride 50 mL/hr at 04/21/15 1209   PRN Meds:.sodium chloride, acetaminophen, ondansetron, sodium chloride  Antibiotics    Anti-infectives    Start     Dose/Rate Route Frequency Ordered  Stop   04/19/15 1400  metroNIDAZOLE (FLAGYL) tablet 500 mg     500 mg Oral 3 times per day 04/19/15 1307 05/03/15 1359       Subjective:   Ericka Pontiff seen and examined today.  Patient complains of nausea.  Denies abdominal pain, chest pain, shortness of breath, diarrhea.   Objective:   Filed Vitals:   04/21/15 0853 04/21/15 0908 04/21/15 1159 04/21/15 1340  BP: '80/42 84/56 86/55 '$ 82/56  Pulse: 118 112 95 98  Temp: 100 F (37.8 C) 99.8 F (37.7 C) 98.7 F (37.1 C) 98.6 F (37 C)  TempSrc: Oral Oral Oral Oral   Resp: '18 18 18 18  '$ Height:      Weight:      SpO2: 95% 96% 95% 96%    Wt Readings from Last 3 Encounters:  04/21/15 59.467 kg (131 lb 1.6 oz)  03/17/15 67.132 kg (148 lb)  08/17/14 64.411 kg (142 lb)     Intake/Output Summary (Last 24 hours) at 04/21/15 1433 Last data filed at 04/21/15 1335  Gross per 24 hour  Intake   1538 ml  Output      0 ml  Net   1538 ml    Exam  General: Well developed, thin, NAD, appears stated age  47: NCAT, mucous membranes moist.   Cardiovascular: S1 S2 auscultated, no rubs, murmurs or gallops. Regular rate and rhythm.  Respiratory: Clear to auscultation bilaterally with equal chest rise  Abdomen: Soft, nontender, nondistended, + bowel sounds  Extremities: warm dry without cyanosis clubbing.  +LE edema B/L  Neuro: AAOx3, nonfocal  Psych: Normal affect and demeanor   Data Review   Micro Results Recent Results (from the past 240 hour(s))  Anaerobic culture     Status: None   Collection Time: 04/15/15  1:53 PM  Result Value Ref Range Status   Specimen Description PERITONEAL CAVITY  Final   Special Requests NONE  Final   Gram Stain   Final    NO WBC SEEN NO SQUAMOUS EPITHELIAL CELLS SEEN NO ORGANISMS SEEN Performed at [facility]    Culture   Final    NO ANAEROBES ISOLATED Performed at [facility]    Report Status 04/20/2015 FINAL  Final  Body fluid culture     Status: None   Collection Time: 04/15/15  1:54 PM  Result Value Ref Range Status   Specimen Description PERITONEAL CAVITY  Final   Special Requests NONE  Final   Gram Stain   Final    NO WBC SEEN NO ORGANISMS SEEN Performed at [facility]    Culture   Final    NO GROWTH 3 DAYS Performed at [facility]    Report Status 04/19/2015 FINAL  Final    Radiology Reports Dg Chest 2 View  04/19/2015   CLINICAL DATA:  Followup pneumonia  EXAM: CHEST  2 VIEW  COMPARISON:  04/14/2015  FINDINGS: Cardiomediastinal silhouette is  stable. No acute infiltrate or pleural effusion. No pulmonary edema. Mild right basilar atelectasis or scarring. Bony thorax is stable. Stable probable chronic mild interstitial prominence.  IMPRESSION: No active cardiopulmonary disease. Probable chronic mild interstitial prominence. Mild right basilar atelectasis or scarring.   Electronically Signed   By: Lahoma Crocker M.D.   On: 04/19/2015 10:02   Dg Chest 2 View  04/14/2015   CLINICAL DATA:  Leg swelling  EXAM: CHEST  2 VIEW  COMPARISON:  03/16/2015  FINDINGS: Small bilateral pleural effusion, greater on  the right. The volume and appearance is similar to 03/16/2015. Mild atelectasis or scarring at the bases with a 14 mm nodular opacity at the peripheral right base which was described on abdominal CT 08/01/2014. Chest CT 03/17/2015 also showed nodular densities and three-month follow-up CT was suggested. No pneumonia, edema, or pneumothorax. Normal heart size and mediastinal contours.  IMPRESSION: Small bilateral pleural effusion.  No pulmonary edema.   Electronically Signed   By: Monte Fantasia M.D.   On: 04/14/2015 17:13   Ct Abdomen Pelvis W Contrast  04/14/2015   CLINICAL DATA:  Abdominal pain/swelling, history of pancreatic cancer status post Whipple 6 years ago, history of lung cancer  EXAM: CT ABDOMEN AND PELVIS WITH CONTRAST  TECHNIQUE: Multidetector CT imaging of the abdomen and pelvis was performed using the standard protocol following bolus administration of intravenous contrast.  CONTRAST:  150m OMNIPAQUE IOHEXOL 300 MG/ML  SOLN  COMPARISON:  CT abdomen pelvis dated 03/17/2015  FINDINGS: Lower chest: Moderate right pleural effusion. Trace fluid along the lateral left lung base. Minimal dependent atelectasis bilaterally.  Hepatobiliary: Geographic heterogeneous perfusion inferiorly within the right hepatic lobe (series 2/image 26), unchanged, favored to reflect a perfusion anomaly/shunting. No suspicious hepatic lesions.  Status post  cholecystectomy. Suspected choledochojejunostomy. No intrahepatic or extrahepatic ductal dilatation.  Pancreas: Status post resection of the pancreatic head/ uncinate process. Residual pancreatic body/ tail is within normal limits.  Spleen: Within normal limits.  Adrenals/Urinary Tract: Adrenal glands are within normal limits.  Kidneys are within normal limits.  No hydronephrosis.  Bladder is thick-walled although underdistended.  Stomach/Bowel: Status post partial gastrectomy with gastrojejunostomy.  No evidence of bowel obstruction.  Colon is mildly thick-walled but largely decompressed.  Vascular/Lymphatic: Atherosclerotic calcifications of the abdominal aorta and branch vessels.  No suspicious abdominopelvic lymphadenopathy.  Reproductive: Mild prostatomegaly.  Other: Large volume abdominopelvic ascites.  Musculoskeletal: Mild degenerative changes of the lumbar spine.  IMPRESSION: Postsurgical change related to prior Whipple procedure.  No findings specific for recurrent or metastatic disease.  Large volume abdominopelvic ascites. Moderate right pleural effusion.  Additional ancillary findings as above.   Electronically Signed   By: SJulian HyM.D.   On: 04/14/2015 20:18   UKoreaParacentesis  04/15/2015   CLINICAL DATA:  Abdominal distention and ascites. Request diagnostic and therapeutic paracentesis.  EXAM: ULTRASOUND GUIDED PARACENTESIS  COMPARISON:  None.  PROCEDURE: An ultrasound guided paracentesis was thoroughly discussed with the patient and questions answered. The benefits, risks, alternatives and complications were also discussed. The patient understands and wishes to proceed with the procedure. Written consent was obtained.  Ultrasound was performed to localize and mark an adequate pocket of fluid in the right lower quadrant of the abdomen. The area was then prepped and draped in the normal sterile fashion. 1% Lidocaine was used for local anesthesia. Under ultrasound guidance a 19 gauge Yueh  catheter was introduced. Paracentesis was performed. The catheter was removed and a dressing applied.  COMPLICATIONS: None immediate  FINDINGS: A total of approximately 2.4 L of clear yellow fluid was removed. A fluid sample was sent for laboratory analysis.  IMPRESSION: Successful ultrasound guided paracentesis yielding 2.4 L of ascites.  Read by: KAscencion DikePA-C   Electronically Signed   By: HJacqulynn CadetM.D.   On: 04/15/2015 13:50    CBC  Recent Labs Lab 04/18/15 0420 04/19/15 0515 04/20/15 0558 04/20/15 1604 04/21/15 0330  WBC 10.1 12.5* 9.1 10.9* 10.2  HGB 9.5* 8.9* 7.7* 9.4* 7.7*  HCT 26.6* 25.2* 21.4*  25.9* 21.6*  PLT 217 218 195 231 190  MCV 98.5 98.4 97.7 100.4* 99.5  MCH 35.2* 34.8* 35.2* 36.4* 35.5*  MCHC 35.7 35.3 36.0 36.3* 35.6  RDW 14.7 14.6 14.5 14.5 14.5  LYMPHSABS 4.1* 4.5* 3.0 2.2 2.9  MONOABS 1.2* 1.3* 0.8 0.7 0.8  EOSABS 0.0 0.0 0.0 0.0 0.0  BASOSABS 0.1 0.1 0.0 0.0 0.0    Chemistries   Recent Labs Lab 04/16/15 0350 04/17/15 0453 04/18/15 0420 04/19/15 0515 04/20/15 0558  NA 134* 135 132* 130* 134*  K 4.0 3.9 4.0 3.9 3.7  CL 101 103 99* 98* 102  CO2 '27 28 26 24 27  '$ GLUCOSE 124* 94 91 88 66  BUN <5* <5* <5* <5* <5*  CREATININE 0.68 0.60* 0.70 0.69 0.67  CALCIUM 7.2* 7.5* 7.5* 7.5* 7.7*  AST 45* 43* 55* 60* 49*  ALT '27 24 28 31 27  '$ ALKPHOS 87 76 83 79 63  BILITOT 0.3 0.6 1.0 1.1 1.9*   ------------------------------------------------------------------------------------------------------------------ estimated creatinine clearance is 88.8 mL/min (by C-G formula based on Cr of 0.67). ------------------------------------------------------------------------------------------------------------------ No results for input(s): HGBA1C in the last 72 hours. ------------------------------------------------------------------------------------------------------------------ No results for input(s): CHOL, HDL, LDLCALC, TRIG, CHOLHDL, LDLDIRECT in the  last 72 hours. ------------------------------------------------------------------------------------------------------------------ No results for input(s): TSH, T4TOTAL, T3FREE, THYROIDAB in the last 72 hours.  Invalid input(s): FREET3 ------------------------------------------------------------------------------------------------------------------ No results for input(s): VITAMINB12, FOLATE, FERRITIN, TIBC, IRON, RETICCTPCT in the last 72 hours.  Coagulation profile  Recent Labs Lab 04/16/15 0350 04/17/15 0453  INR 1.28 1.35    No results for input(s): DDIMER in the last 72 hours.  Cardiac Enzymes No results for input(s): CKMB, TROPONINI, MYOGLOBIN in the last 168 hours.  Invalid input(s): CK ------------------------------------------------------------------------------------------------------------------ Invalid input(s): POCBNP    Charde Macfarlane D.O. on 04/21/2015 at 2:33 PM  Between 7am to 7pm - Pager - [phone]  After 7pm go to [email or web address] - password [email or web address]  And look for the night coverage person covering for me after hours  [facility] Office  [phone]

## 2015-04-21 NOTE — Clinical Documentation Improvement (Signed)
Presents with new ascites; paracentesis performed with apparent benign cytology.  After study, please link the ascites, SOB, leg swelling to a more specific condition causing admission:   Ascites likely secondary to _____________  Please document findings in next progress note and include in discharge summary.   Thank You, Zoila Shutter ,RN Clinical Documentation Specialist:  [place] Information Management

## 2015-04-22 ENCOUNTER — Inpatient Hospital Stay (HOSPITAL_COMMUNITY): Payer: Medicare Other

## 2015-04-22 DIAGNOSIS — Z87891 Personal history of nicotine dependence: Secondary | ICD-10-CM

## 2015-04-22 DIAGNOSIS — R740 Nonspecific elevation of levels of transaminase and lactic acid dehydrogenase [LDH]: Secondary | ICD-10-CM

## 2015-04-22 LAB — CBC WITH DIFFERENTIAL/PLATELET
Basophils Absolute: 0 10*3/uL (ref 0.0–0.1)
Basophils Relative: 0 % (ref 0–1)
EOS PCT: 0 % (ref 0–5)
Eosinophils Absolute: 0.1 10*3/uL (ref 0.0–0.7)
HEMATOCRIT: 25.4 % — AB (ref 39.0–52.0)
Hemoglobin: 9.2 g/dL — ABNORMAL LOW (ref 13.0–17.0)
Lymphocytes Relative: 23 % (ref 12–46)
Lymphs Abs: 2.8 10*3/uL (ref 0.7–4.0)
MCH: 34.2 pg — AB (ref 26.0–34.0)
MCHC: 36.2 g/dL — AB (ref 30.0–36.0)
MCV: 94.4 fL (ref 78.0–100.0)
MONO ABS: 0.8 10*3/uL (ref 0.1–1.0)
MONOS PCT: 7 % (ref 3–12)
Neutro Abs: 8.4 10*3/uL — ABNORMAL HIGH (ref 1.7–7.7)
Neutrophils Relative %: 70 % (ref 43–77)
Platelets: 213 10*3/uL (ref 150–400)
RBC: 2.69 MIL/uL — AB (ref 4.22–5.81)
RDW: 16.2 % — ABNORMAL HIGH (ref 11.5–15.5)
WBC: 12.1 10*3/uL — AB (ref 4.0–10.5)

## 2015-04-22 LAB — TYPE AND SCREEN
ABO/RH(D): A POS
Antibody Screen: NEGATIVE
Unit division: 0

## 2015-04-22 LAB — BASIC METABOLIC PANEL
Anion gap: 6 (ref 5–15)
BUN: 5 mg/dL — AB (ref 6–20)
CALCIUM: 7.7 mg/dL — AB (ref 8.9–10.3)
CHLORIDE: 101 mmol/L (ref 101–111)
CO2: 26 mmol/L (ref 22–32)
Creatinine, Ser: 0.62 mg/dL (ref 0.61–1.24)
Glucose, Bld: 69 mg/dL (ref 65–99)
POTASSIUM: 4.2 mmol/L (ref 3.5–5.1)
Sodium: 133 mmol/L — ABNORMAL LOW (ref 135–145)

## 2015-04-22 LAB — CLOSTRIDIUM DIFFICILE BY PCR: Toxigenic C. Difficile by PCR: NEGATIVE

## 2015-04-22 MED ORDER — ENSURE ENLIVE PO LIQD
237.0000 mL | Freq: Two times a day (BID) | ORAL | Status: DC
  Administered 2015-04-23: 237 mL via ORAL

## 2015-04-22 MED ORDER — WHITE PETROLATUM GEL
Status: AC
  Administered 2015-04-22: 0.2
  Filled 2015-04-22: qty 1

## 2015-04-22 NOTE — Progress Notes (Addendum)
[facility]                                                                              Patient Demographics  Patrick Lee, is a 54 y.o. male, DOB - 01-13-1961, OEU:[id number]  Admit date - 04/14/2015   Admitting Physician Deneise Lever, MD  Outpatient Primary MD for the patient is [name]  LOS - 7   Chief Complaint  Patient presents with  . Leg Swelling      HPI on 04/14/2015 by Dr. Shanda Howells This is a 54 y.o. year old male with significant past medical history of pancreatic cancer s/p whipple procedure, lung cancer, DVT and PE on lovenox, CAD presenting with anasarca. Patient reports progressive abdominal and lower extremity swelling over the past 2-3 weeks. Has seen his PCP about symptoms. Was placed on spironolactone for treatment. Patient reports her minimal improvement in symptoms. Denies any NSAID use. Reports decreased salt intake over the time frame. Denies any orthopnea or PND. Denies any alcohol abuse. Reports this is been a subacute on chronic issue for several months. Nonsmoker. Has been compliant with anticoagulation regimen. Presented to the ER afebrile, hemodynamically stable. CBC grossly within normal limits. CMP grossly within normal limits. Lipase within normal limits. BNP within normal limits at 52. CT of the abdomen and pelvis shows postsurgical changes secondary to will procedure. No findings of recurrent metastatic disease. Large volume abdominopelvic ascites and moderate right pleural effusion.  Assessment & Plan   New onset Ascites/third spacing/Leg swelling -Etiology unknown, Possibly secondary to recurrent pancreatic malignancy -Patient states he has had leg swelling for about 1 year -SAAG <1.1, ddx: carcinomatosis, tuberculosis, pancreatitis, serositis, or nephrosis -Hepatitis Panel negative -Body fluid culture show no grwoth -Continue diuresis, lasix and aldactone -Echcoardiogram 03/17/2015: EF 15-40%, diastolic function  normal -Patient will follow up with Dr. Laural Golden in [place]  History of pancreatic cancer -CA 19-9: 78 -AFP 1.3 -CEA 5.6 -Patient follows with oncology in [place]  Iatrogenic vs Acute blood loss anemia -Patient had normal colonoscopy 2-3 years ago -given 1uPRBCs 04/21/2015 -Hemoglobin 9.2 -FOBT pending  SIRS/fever -CXR (04/19/2015) negative for infection  -UA: 3-6WBC, small leukocytes, positive nitrite -Pending urine culture (as patient is not complaining of dysuria) -Not entirely sure as to why patient is on flagyl ?intraabdominal pathology -Cdiff pending (however patient has no diarrhea) -Continue flagyl -Small spike in WBC, 12 -Will obtain repeat CXR as patient complains of cough today  Hypotension -Patient was given albumin, but seems to have chronically low BP -Continue to monitor closely  Bilateral DVT/PE -Continue lovenox  COPD -Stable, no wheezing  Malnutrition -Nutrition consulted  Generalized weakness/deconditioning -Likely multifactorial: acute illness, malnutrition -PT consulted and recommended HH  Code Status: Full  Family Communication: None at bedside  Disposition Plan: Admitted.  Expect d/c within the next 24-48hours  Time Spent in minutes   30 minutes  Procedures  Paracentesis 04/15/2015  Consults   Interventional radiology  DVT Prophylaxis  Lovenox  Lab Results  Component Value Date   PLT 213 04/22/2015    Medications  Scheduled Meds: . enoxaparin (LOVENOX) injection  100 mg Subcutaneous Q24H  . metroNIDAZOLE  500 mg Oral 3  times per day  . oxyCODONE  10 mg Oral Q4H  . sodium chloride  3 mL Intravenous Q12H   Continuous Infusions:   PRN Meds:.sodium chloride, acetaminophen, ondansetron, sodium chloride  Antibiotics    Anti-infectives    Start     Dose/Rate Route Frequency Ordered Stop   04/19/15 1400  metroNIDAZOLE (FLAGYL) tablet 500 mg     500 mg Oral 3 times per day 04/19/15 1307 05/03/15 1359       Subjective:    Patrick Lee seen and examined today.  Patient no longer complains of nausea.  Does complain of cough and feeling weak.   Denies abdominal pain, chest pain, shortness of breath, diarrhea.   Objective:   Filed Vitals:   04/21/15 1159 04/21/15 1340 04/21/15 2106 04/22/15 0540  BP: '86/55 82/56 87/63 '$ 92/56  Pulse: 95 98 82 107  Temp: 98.7 F (37.1 C) 98.6 F (37 C) 98.2 F (36.8 C) 98.9 F (37.2 C)  TempSrc: Oral Oral Oral Oral  Resp: '18 18 17 18  '$ Height:      Weight:    61.236 kg (135 lb)  SpO2: 95% 96% 98% 97%    Wt Readings from Last 3 Encounters:  04/22/15 61.236 kg (135 lb)  03/17/15 67.132 kg (148 lb)  08/17/14 64.411 kg (142 lb)     Intake/Output Summary (Last 24 hours) at 04/22/15 1215 Last data filed at 04/22/15 0541  Gross per 24 hour  Intake  572.5 ml  Output    150 ml  Net  422.5 ml    Exam  General: Well developed, thin, no distress  HEENT: NCAT, mucous membranes moist.   Cardiovascular: S1 S2 auscultated, RRR, no murmurs  Respiratory: Clear to auscultation  Abdomen: Soft, nontender, nondistended, + bowel sounds  Extremities: warm dry without cyanosis clubbing.  +LE edema B/L  Neuro: AAOx3, nonfocal  Data Review   Micro Results Recent Results (from the past 240 hour(s))  Anaerobic culture     Status: None   Collection Time: 04/15/15  1:53 PM  Result Value Ref Range Status   Specimen Description PERITONEAL CAVITY  Final   Special Requests NONE  Final   Gram Stain   Final    NO WBC SEEN NO SQUAMOUS EPITHELIAL CELLS SEEN NO ORGANISMS SEEN Performed at [facility]    Culture   Final    NO ANAEROBES ISOLATED Performed at [facility]    Report Status 04/20/2015 FINAL  Final  Body fluid culture     Status: None   Collection Time: 04/15/15  1:54 PM  Result Value Ref Range Status   Specimen Description PERITONEAL CAVITY  Final   Special Requests NONE  Final   Gram Stain   Final    NO WBC SEEN NO ORGANISMS  SEEN Performed at [facility]    Culture   Final    NO GROWTH 3 DAYS Performed at [facility]    Report Status 04/19/2015 FINAL  Final  Clostridium Difficile by PCR (not at [facility])     Status: None   Collection Time: 04/21/15 10:20 PM  Result Value Ref Range Status   C difficile by pcr NEGATIVE NEGATIVE Final    Radiology Reports Dg Chest 2 View  04/19/2015   CLINICAL DATA:  Followup pneumonia  EXAM: CHEST  2 VIEW  COMPARISON:  04/14/2015  FINDINGS: Cardiomediastinal silhouette is stable. No acute infiltrate or pleural effusion. No pulmonary edema. Mild right basilar atelectasis or scarring. Bony  thorax is stable. Stable probable chronic mild interstitial prominence.  IMPRESSION: No active cardiopulmonary disease. Probable chronic mild interstitial prominence. Mild right basilar atelectasis or scarring.   Electronically Signed   By: Lahoma Crocker M.D.   On: 04/19/2015 10:02   Dg Chest 2 View  04/14/2015   CLINICAL DATA:  Leg swelling  EXAM: CHEST  2 VIEW  COMPARISON:  03/16/2015  FINDINGS: Small bilateral pleural effusion, greater on the right. The volume and appearance is similar to 03/16/2015. Mild atelectasis or scarring at the bases with a 14 mm nodular opacity at the peripheral right base which was described on abdominal CT 08/01/2014. Chest CT 03/17/2015 also showed nodular densities and three-month follow-up CT was suggested. No pneumonia, edema, or pneumothorax. Normal heart size and mediastinal contours.  IMPRESSION: Small bilateral pleural effusion.  No pulmonary edema.   Electronically Signed   By: Monte Fantasia M.D.   On: 04/14/2015 17:13   Ct Abdomen Pelvis W Contrast  04/14/2015   CLINICAL DATA:  Abdominal pain/swelling, history of pancreatic cancer status post Whipple 6 years ago, history of lung cancer  EXAM: CT ABDOMEN AND PELVIS WITH CONTRAST  TECHNIQUE: Multidetector CT imaging of the abdomen and pelvis was performed using the standard protocol following bolus  administration of intravenous contrast.  CONTRAST:  155m OMNIPAQUE IOHEXOL 300 MG/ML  SOLN  COMPARISON:  CT abdomen pelvis dated 03/17/2015  FINDINGS: Lower chest: Moderate right pleural effusion. Trace fluid along the lateral left lung base. Minimal dependent atelectasis bilaterally.  Hepatobiliary: Geographic heterogeneous perfusion inferiorly within the right hepatic lobe (series 2/image 26), unchanged, favored to reflect a perfusion anomaly/shunting. No suspicious hepatic lesions.  Status post cholecystectomy. Suspected choledochojejunostomy. No intrahepatic or extrahepatic ductal dilatation.  Pancreas: Status post resection of the pancreatic head/ uncinate process. Residual pancreatic body/ tail is within normal limits.  Spleen: Within normal limits.  Adrenals/Urinary Tract: Adrenal glands are within normal limits.  Kidneys are within normal limits.  No hydronephrosis.  Bladder is thick-walled although underdistended.  Stomach/Bowel: Status post partial gastrectomy with gastrojejunostomy.  No evidence of bowel obstruction.  Colon is mildly thick-walled but largely decompressed.  Vascular/Lymphatic: Atherosclerotic calcifications of the abdominal aorta and branch vessels.  No suspicious abdominopelvic lymphadenopathy.  Reproductive: Mild prostatomegaly.  Other: Large volume abdominopelvic ascites.  Musculoskeletal: Mild degenerative changes of the lumbar spine.  IMPRESSION: Postsurgical change related to prior Whipple procedure.  No findings specific for recurrent or metastatic disease.  Large volume abdominopelvic ascites. Moderate right pleural effusion.  Additional ancillary findings as above.   Electronically Signed   By: SJulian HyM.D.   On: 04/14/2015 20:18   [place]oreaParacentesis  04/15/2015   CLINICAL DATA:  Abdominal distention and ascites. Request diagnostic and therapeutic paracentesis.  EXAM: ULTRASOUND GUIDED PARACENTESIS  COMPARISON:  None.  PROCEDURE: An ultrasound guided paracentesis was  thoroughly discussed with the patient and questions answered. The benefits, risks, alternatives and complications were also discussed. The patient understands and wishes to proceed with the procedure. Written consent was obtained.  Ultrasound was performed to localize and mark an adequate pocket of fluid in the right lower quadrant of the abdomen. The area was then prepped and draped in the normal sterile fashion. 1% Lidocaine was used for local anesthesia. Under ultrasound guidance a 19 gauge Yueh catheter was introduced. Paracentesis was performed. The catheter was removed and a dressing applied.  COMPLICATIONS: None immediate  FINDINGS: A total of approximately 2.4 L of clear yellow fluid was removed. A fluid sample  was sent for laboratory analysis.  IMPRESSION: Successful ultrasound guided paracentesis yielding 2.4 L of ascites.  Read by: Ascencion Dike PA-C   Electronically Signed   By: Jacqulynn Cadet M.D.   On: 04/15/2015 13:50    CBC  Recent Labs Lab 04/19/15 0515 04/20/15 0558 04/20/15 1604 04/21/15 0330 04/22/15 0400  WBC 12.5* 9.1 10.9* 10.2 12.1*  HGB 8.9* 7.7* 9.4* 7.7* 9.2*  HCT 25.2* 21.4* 25.9* 21.6* 25.4*  PLT 218 195 231 190 213  MCV 98.4 97.7 100.4* 99.5 94.4  MCH 34.8* 35.2* 36.4* 35.5* 34.2*  MCHC 35.3 36.0 36.3* 35.6 36.2*  RDW 14.6 14.5 14.5 14.5 16.2*  LYMPHSABS 4.5* 3.0 2.2 2.9 2.8  MONOABS 1.3* 0.8 0.7 0.8 0.8  EOSABS 0.0 0.0 0.0 0.0 0.1  BASOSABS 0.1 0.0 0.0 0.0 0.0    Chemistries   Recent Labs Lab 04/16/15 0350 04/17/15 0453 04/18/15 0420 04/19/15 0515 04/20/15 0558 04/22/15 0400  NA 134* 135 132* 130* 134* 133*  K 4.0 3.9 4.0 3.9 3.7 4.2  CL 101 103 99* 98* 102 101  CO2 '27 28 26 24 27 26  '$ GLUCOSE 124* 94 91 88 66 69  BUN <5* <5* <5* <5* <5* 5*  CREATININE 0.68 0.60* 0.70 0.69 0.67 0.62  CALCIUM 7.2* 7.5* 7.5* 7.5* 7.7* 7.7*  AST 45* 43* 55* 60* 49*  --   ALT '27 24 28 31 27  '$ --   ALKPHOS 87 76 83 79 63  --   BILITOT 0.3 0.6 1.0 1.1 1.9*  --     ------------------------------------------------------------------------------------------------------------------ estimated creatinine clearance is 91.4 mL/min (by C-G formula based on Cr of 0.62). ------------------------------------------------------------------------------------------------------------------ No results for input(s): HGBA1C in the last 72 hours. ------------------------------------------------------------------------------------------------------------------ No results for input(s): CHOL, HDL, LDLCALC, TRIG, CHOLHDL, LDLDIRECT in the last 72 hours. ------------------------------------------------------------------------------------------------------------------ No results for input(s): TSH, T4TOTAL, T3FREE, THYROIDAB in the last 72 hours.  Invalid input(s): FREET3 ------------------------------------------------------------------------------------------------------------------ No results for input(s): VITAMINB12, FOLATE, FERRITIN, TIBC, IRON, RETICCTPCT in the last 72 hours.  Coagulation profile  Recent Labs Lab 04/16/15 0350 04/17/15 0453  INR 1.28 1.35    No results for input(s): DDIMER in the last 72 hours.  Cardiac Enzymes No results for input(s): CKMB, TROPONINI, MYOGLOBIN in the last 168 hours.  Invalid input(s): CK ------------------------------------------------------------------------------------------------------------------ Invalid input(s): POCBNP    Ruie Sendejo D.O. on 04/22/2015 at 12:15 PM  Between 7am to 7pm - Pager - [phone]  After 7pm go to [email or web address] - password [email or web address]  And look for the night coverage person covering for me after hours  [facility] Office  [phone]

## 2015-04-22 NOTE — Progress Notes (Signed)
Initial Nutrition Assessment  DOCUMENTATION CODES:  Non-severe (moderate) malnutrition in context of chronic illness  INTERVENTION:  Ensure Enlive po BID, each supplement provides 350 kcal and 20 grams of protein NUTRITION DIAGNOSIS:  Malnutrition related to chronic illness as evidenced by moderate depletion of body fat, moderate depletions of muscle mass.   GOAL:  Patient will meet greater than or equal to 90% of their needs   MONITOR:  PO intake, Supplement acceptance, Labs, Weight trends, I & O's, Skin  REASON FOR ASSESSMENT:  Consult Assessment of nutrition requirement/status  ASSESSMENT: This is a 54 y.o. year old male with significant past medical history of pancreatic cancer s/p whipple procedure, lung cancer, DVT and PE on lovenox, CAD presenting with anasarca. Patient reports progressive abdominal and lower extremity swelling over the past 2-3 weeks. Has seen his PCP about symptoms. Was placed on spironolactone for treatment. Patient reports her minimal improvement in symptoms. Denies any NSAID use. Reports decreased salt intake over the time frame.   Pt reports a general decline in health over the past year. He reports a 30-35# wt loss over the past year, which is consistent with weight hx (18%). He reveals his usual body weight is 178# per his report.   Pt reports he was s/p whipple procedure in 2008, but has no had recent oncology or GI follow-up. He reports having a paracentesis a few days ago, which made him feel much better. He reports his appetite is generally good at home, however, has decreased since admission, which he attributes to nausea. He reveals that he feels like throwing up every time he eats (RN just administered nausea meds). He also complains of lack of salt and dietary restrictions- reports that he was told he cannot drink milk due to sodium content. Pt was previously educated on low sodium diet- this RD emphasized importance of avoiding added salt to  prevent fluid retention, but also importance of adequate protein intake to help preserve muscle mass. Pt amenable to Ensure supplements.   Pt reports that he has been progressively weaker over the past year and that his skin is "saggy". Nutrition-Focused physical exam completed. Findings are mild to moderate fat depletion, mild to moderate muscle depletion, and moderate edema.    Findings and plan discussed with RN.   Height:  Ht Readings from Last 1 Encounters:  04/15/15 '5\' 11"'$  (1.803 m)    Weight:  Wt Readings from Last 1 Encounters:  04/22/15 135 lb (61.236 kg)    Ideal Body Weight:  78.2 kg  Wt Readings from Last 10 Encounters:  04/22/15 135 lb (61.236 kg)  03/17/15 148 lb (67.132 kg)  08/17/14 142 lb (64.411 kg)  08/01/14 142 lb (64.411 kg)  06/23/13 166 lb (75.297 kg)  04/19/13 166 lb 3.2 oz (75.388 kg)  03/25/13 169 lb (76.658 kg)  02/22/13 170 lb (77.111 kg)  01/12/13 171 lb (77.565 kg)  08/11/12 152 lb (68.947 kg)    BMI:  Body mass index is 18.84 kg/(m^2).  Estimated Nutritional Needs:  Kcal:  1900-2100  Protein:  90-100 grams  Fluid:  1.9-2.1 L  Skin:  Reviewed, no issues  Diet Order:  Diet 2 gram sodium Room service appropriate?: Yes; Fluid consistency:: Thin  EDUCATION NEEDS:  Education needs addressed   Intake/Output Summary (Last 24 hours) at 04/22/15 1808 Last data filed at 04/22/15 0541  Gross per 24 hour  Intake    240 ml  Output    150 ml  Net  90 ml    Last BM:  04/22/15  Aprel Egelhoff A. Jimmye Norman, RD, LDN, CDE Pager: [phone] After hours Pager: [phone]

## 2015-04-22 NOTE — Evaluation (Signed)
Physical Therapy Evaluation Patient Details Name: Patrick Lee MRN: [id number] DOB: 05/26/1961 Today's Date: 04/22/2015   History of Present Illness  Patient is a 54 y/o male admitted for anasarca. PMH includes pancreatic cancer s/p whipple procedure, lung cancer, DVT and PE on lovenox, CAD, MI and lung ca.  Clinical Impression  Patient presents with hypotension and mild balance deficits/weakness impacting mobility. Ambulation distance limited due to low BP.  Sitting EOB BP 90/58, HR 102 bpm Standing BP 83/54, HR 107 bpm asymptomatic.  Discussed importance of sitting when feeling dizzy or light headed due to risk of falls. Instructed pt to ambulate with assist later to improve strength/mobiltiy. Will continue to follow to maximize independence, endurance and mobility prior to return home.    Follow Up Recommendations Supervision - Intermittent;Home health PT    Equipment Recommendations  None recommended by PT    Recommendations for Other Services       Precautions / Restrictions Precautions Precautions: Fall Precaution Comments: monitor BP Restrictions Weight Bearing Restrictions: No      Mobility  Bed Mobility Overal bed mobility: Modified Independent                Transfers Overall transfer level: Modified independent Equipment used: None             General transfer comment: mild sway noted in standing. No dizziness.  Ambulation/Gait Ambulation/Gait assistance: Min guard Ambulation Distance (Feet): 40 Feet Assistive device:  (IV pole.) Gait Pattern/deviations: Step-through pattern;Decreased stride length   Gait velocity interpretation: Below normal speed for age/gender General Gait Details: Mildly unsteady gait but no dizziness. Use of IV pole for support to simulate cane.  Stairs            Wheelchair Mobility    Modified Rankin (Stroke Patients Only)       Balance Overall balance assessment: Needs assistance Sitting-balance  support: Feet supported;No upper extremity supported Sitting balance-Leahy Scale: Good     Standing balance support: During functional activity Standing balance-Leahy Scale: Fair                               Pertinent Vitals/Pain Pain Assessment: No/denies pain    Home Living Family/patient expects to be discharged to:: Private residence Living Arrangements: Alone Available Help at Discharge: Family;Available PRN/intermittently Type of Home: House Home Access: Stairs to enter Entrance Stairs-Rails: Right Entrance Stairs-Number of Steps: 7 split up Home Layout: One level Home Equipment: Cane - single point Additional Comments: Pt reports he will be staying with his brother or daughter at d/c for a few days.    Prior Function Level of Independence: Independent with assistive device(s)         Comments: Pt uses SPC for community ambulation and independent for household.     Hand Dominance        Extremity/Trunk Assessment   Upper Extremity Assessment: Defer to OT evaluation           Lower Extremity Assessment: Generalized weakness         Communication   Communication: No difficulties  Cognition Arousal/Alertness: Awake/alert Behavior During Therapy: WFL for tasks assessed/performed Overall Cognitive Status: Within Functional Limits for tasks assessed                      General Comments General comments (skin integrity, edema, etc.): See vitals in assessment section. Pitting edema present Bil ankle 2+. Encouraged AROM to  improve swelling/fluid movement.   Exercises        Assessment/Plan    PT Assessment Patient needs continued PT services  PT Diagnosis Generalized weakness   PT Problem List Decreased strength;Decreased balance;Decreased mobility;Decreased activity tolerance;Cardiopulmonary status limiting activity  PT Treatment Interventions Balance training;Gait training;Stair training;Functional mobility  training;Therapeutic exercise;Therapeutic activities;Patient/family education   PT Goals (Current goals can be found in the Care Plan section) Acute Rehab PT Goals Patient Stated Goal: to get better PT Goal Formulation: With patient Time For Goal Achievement: 05/06/15 Potential to Achieve Goals: Good    Frequency Min 3X/week   Barriers to discharge Decreased caregiver support      Co-evaluation               End of Session   Activity Tolerance: Patient tolerated treatment well;Patient limited by fatigue Patient left: in chair;with call bell/phone within reach Nurse Communication: Mobility status         Time: 2671-2458 PT Time Calculation (min) (ACUTE ONLY): 25 min   Charges:   PT Evaluation $Initial PT Evaluation Tier I: 1 Procedure PT Treatments $Therapeutic Activity: 8-22 mins   PT G Codes:        Harumi Yamin A Climmie Cronce 04/22/2015, 11:46 AM  Wray Kearns, PT, DPT [phone]

## 2015-04-23 DIAGNOSIS — E440 Moderate protein-calorie malnutrition: Secondary | ICD-10-CM | POA: Insufficient documentation

## 2015-04-23 DIAGNOSIS — I2699 Other pulmonary embolism without acute cor pulmonale: Secondary | ICD-10-CM

## 2015-04-23 LAB — COMPREHENSIVE METABOLIC PANEL
ALBUMIN: 2.1 g/dL — AB (ref 3.5–5.0)
ALK PHOS: 80 U/L (ref 38–126)
ALT: 22 U/L (ref 17–63)
ANION GAP: 4 — AB (ref 5–15)
AST: 32 U/L (ref 15–41)
CALCIUM: 7.8 mg/dL — AB (ref 8.9–10.3)
CHLORIDE: 104 mmol/L (ref 101–111)
CO2: 27 mmol/L (ref 22–32)
Creatinine, Ser: 0.69 mg/dL (ref 0.61–1.24)
GFR calc Af Amer: >60 mL/min (ref >60)
GFR calc non Af Amer: >60 mL/min (ref >60)
Glucose, Bld: 67 mg/dL (ref 65–99)
Potassium: 4.3 mmol/L (ref 3.5–5.1)
Sodium: 135 mmol/L (ref 135–145)
TOTAL PROTEIN: 4.1 g/dL — AB (ref 6.5–8.1)
Total Bilirubin: 1.7 mg/dL — ABNORMAL HIGH (ref 0.3–1.2)

## 2015-04-23 LAB — CBC WITH DIFFERENTIAL/PLATELET
Basophils Absolute: 0.1 K/uL (ref 0.0–0.1)
Basophils Relative: 1 % (ref 0–1)
Eosinophils Absolute: 0.1 K/uL (ref 0.0–0.7)
Eosinophils Relative: 1 % (ref 0–5)
HCT: 24.4 % — ABNORMAL LOW (ref 39.0–52.0)
Hemoglobin: 8.8 g/dL — ABNORMAL LOW (ref 13.0–17.0)
Lymphocytes Relative: 30 % (ref 12–46)
Lymphs Abs: 3 K/uL (ref 0.7–4.0)
MCH: 34.2 pg — ABNORMAL HIGH (ref 26.0–34.0)
MCHC: 36.1 g/dL — ABNORMAL HIGH (ref 30.0–36.0)
MCV: 94.9 fL (ref 78.0–100.0)
Monocytes Absolute: 0.9 K/uL (ref 0.1–1.0)
Monocytes Relative: 9 % (ref 3–12)
Neutro Abs: 6.1 K/uL (ref 1.7–7.7)
Neutrophils Relative %: 59 % (ref 43–77)
Platelets: 214 K/uL (ref 150–400)
RBC: 2.57 MIL/uL — ABNORMAL LOW (ref 4.22–5.81)
RDW: 15.4 % (ref 11.5–15.5)
WBC: 10.1 K/uL (ref 4.0–10.5)

## 2015-04-23 LAB — URINE CULTURE

## 2015-04-23 MED ORDER — OXYCODONE HCL 10 MG PO TABS: 10.0000 mg | ORAL_TABLET | ORAL | Status: DC

## 2015-04-23 MED ORDER — ONDANSETRON HCL 4 MG PO TABS: 4.0000 mg | ORAL_TABLET | Freq: Three times a day (TID) | ORAL | Status: DC | PRN

## 2015-04-23 MED ORDER — LEVOFLOXACIN 500 MG PO TABS: 500.0000 mg | ORAL_TABLET | Freq: Every day | ORAL | Status: DC

## 2015-04-23 MED ORDER — METRONIDAZOLE 500 MG PO TABS: 500.0000 mg | ORAL_TABLET | Freq: Three times a day (TID) | ORAL | Status: DC

## 2015-04-23 MED ORDER — ENSURE ENLIVE PO LIQD: 237.0000 mL | Freq: Two times a day (BID) | ORAL | Status: DC

## 2015-04-23 NOTE — Discharge Instructions (Signed)
Ascites °Ascites is a gathering of fluid in the belly (abdomen). This is most often caused by liver disease. It may also be caused by a number of other less common problems. It causes a ballooning out (distension) of the abdomen. °CAUSES  °Scarring of the liver (cirrhosis) is the most common cause of ascites. Other causes include: °· Infection or inflammation in the abdomen. °· Cancer in the abdomen. °· Heart failure. °· Certain forms of kidney failure (nephritic syndrome). °· Inflammation of the pancreas. °· Clots in the veins of the liver. °SYMPTOMS  °In the early stages of ascites, you may not have any symptoms. The main symptom of ascites is a sense of abdominal bloating. This is due to the presence of fluid. This may also cause an increase in abdominal or waist size. People with this condition can develop swelling in the legs, and men can develop a swollen scrotum. When there is a lot of fluid, it may be hard to breath. Stretching of the abdomen by fluid can be painful. °DIAGNOSIS  °Certain features of your medical history, such as a history of liver disease and of an enlarging abdomen, can suggest the presence of ascites. The diagnosis of ascites can be made on physical exam by your caregiver. An abdominal ultrasound examination can confirm that ascites is present, and estimate the amount of fluid. °Once ascites is confirmed, it is important to determine its cause. Again, a history of one of the conditions listed in "CAUSES" provides a strong clue. A physical exam is important, and blood and X-ray tests may be needed. During a procedure called paracentesis, a sample of fluid is removed from the abdomen. This can determine certain key features about the fluid, such as whether or not infection or cancer is present. Your caregiver will determine if a paracentesis is necessary. They will describe the procedure to you. °PREVENTION  °Ascites is a complication of other conditions. Therefore to prevent ascites, you  must seek treatment for any significant health conditions you have. Once ascites is present, careful attention to fluid and salt intake may help prevent it from getting worse. If you have ascites, you should not drink alcohol. °PROGNOSIS  °The prognosis of ascites depends on the underlying disease. If the disease is reversible, such as with certain infections or with heart failure, then ascites may improve or disappear. When ascites is caused by cirrhosis, then it indicates that the liver disease has worsened, and further evaluation and treatment of the liver disease is needed. If your ascites is caused by cancer, then the success or failure of the cancer treatment will determine whether your ascites will improve or worsen. °RISKS AND COMPLICATIONS  °Ascites is likely to worsen if it is not properly diagnosed and treated. A large amount of ascites can cause pain and difficulty breathing. The main complication, besides worsening, is infection (called spontaneous bacterial peritonitis). This requires prompt treatment. °TREATMENT  °The treatment of ascites depends on its cause. When liver disease is your cause, medical management using water pills (diuretics) and decreasing salt intake is often effective. Ascites due to peritoneal inflammation or malignancy (cancer) alone does not respond to salt restriction and diuretics. Hospitalization is sometimes required. °If the treatment of ascites cannot be managed with medications, a number of other treatments are available. Your caregivers will help you decide which will work best for you. Some of these are: °· Removal of fluid from the abdomen (paracentesis). °· Fluid from the abdomen is passed into a vein (peritoneovenous shunting). °·   Liver transplantation. °· Transjugular intrahepatic portosystemic stent shunt. °HOME CARE INSTRUCTIONS  °It is important to monitor body weight and the intake and output of fluids. Weigh yourself at the same time every day. Record your  weights. Fluid restriction may be necessary. It is also important to know your salt intake. The more salt you take in, the more fluid you will retain. Ninety percent of people with ascites respond to this approach. °· Follow any directions for medicines carefully. °· Follow up with your caregiver, as directed. °· Report any changes in your health, especially any new or worsening symptoms. °· If your ascites is from liver disease, avoid alcohol and other substances toxic to the liver. °SEEK MEDICAL CARE IF:  °· Your weight increases more than a few pounds in a few days. °· Your abdominal or waist size increases. °· You develop swelling in your legs. °· You had swelling and it worsens. °SEEK IMMEDIATE MEDICAL CARE IF:  °· You develop a fever. °· You develop new abdominal pain. °· You develop difficulty breathing. °· You develop confusion. °· You have bleeding from the mouth, stomach, or rectum. °MAKE SURE YOU:  °· Understand these instructions. °· Will watch your condition. °· Will get help right away if you are not doing well or get worse. °Document Released: 10/21/2005 Document Revised: 01/13/2012 Document Reviewed: 05/22/2007 °ExitCare® Patient Information ©2015 [employer]. This information is not intended to replace advice given to you by your health care provider. Make sure you discuss any questions you have with your health care provider. ° °

## 2015-04-23 NOTE — Discharge Summary (Deleted)
Physician Discharge Summary  Patrick Lee RCV:[id number] DOB: 1961/09/17 DOA: 04/14/2015  PCP: [place] date: 04/14/2015 Discharge date: 04/23/2015  Time spent: 45 minutes  Recommendations for Outpatient Follow-up:  Patient will be discharged to home with home health PT.  Patient will need to follow up with primary care provider within one week of discharge, and should have a repeat CBC/CMP at that time.  Patient should also have a repeat Chest xray in 1 week.  Patient will also need to follow up with gastroenterology, Dr. Laural Golden, within 1-2 weeks of discharge.  Patient should continue medications as prescribed.  Patient should follow a low sodium diet diet.   Discharge Diagnoses:  New-onset ascites last third spacing/leg swelling History of pancreatic cancer Iatrogenic versus acute blood loss anemia SIRS/fever possibly secondary to ?UTI/pneumonia Hypotension Bilateral DVT/PE COPD Malnutrition Generalized weakness/deconditioning  Discharge Condition: Stable  Diet recommendation: Low sodium   Filed Weights   04/21/15 0604 04/22/15 0540 04/23/15 0508  Weight: 59.467 kg (131 lb 1.6 oz) 61.236 kg (135 lb) 59.557 kg (131 lb 4.8 oz)    History of present illness:  on 04/14/2015 by Dr. Shanda Howells This is a 54 y.o. year old male with significant past medical history of pancreatic cancer s/p whipple procedure, lung cancer, DVT and PE on lovenox, CAD presenting with anasarca. Patient reports progressive abdominal and lower extremity swelling over the past 2-3 weeks. Has seen his PCP about symptoms. Was placed on spironolactone for treatment. Patient reports her minimal improvement in symptoms. Denies any NSAID use. Reports decreased salt intake over the time frame. Denies any orthopnea or PND. Denies any alcohol abuse. Reports this is been a subacute on chronic issue for several months. Nonsmoker. Has been compliant with anticoagulation regimen. Presented to the  ER afebrile, hemodynamically stable. CBC grossly within normal limits. CMP grossly within normal limits. Lipase within normal limits. BNP within normal limits at 52. CT of the abdomen and pelvis shows postsurgical changes secondary to will procedure. No findings of recurrent metastatic disease. Large volume abdominopelvic ascites and moderate right pleural effusion.  Hospital Course:  New onset Ascites/third spacing/Leg swelling -Etiology unknown, Possibly secondary to recurrent pancreatic malignancy -Patient states he has had leg swelling for about 1 year -SAAG <1.1, ddx: carcinomatosis, tuberculosis, pancreatitis, serositis, or nephrosis -Hepatitis Panel negative -Body fluid culture show no grwoth -Continue diuresis, lasix and aldactone -Echcoardiogram 03/17/2015: EF 10-25%, diastolic function normal -Patient will follow up with Dr. Laural Golden in [place]  History of pancreatic cancer -CA 19-9: 78 -AFP 1.3 -CEA 5.6 -Patient follows with oncology in [place] -Dr. Verlon Au spoke with Dr. Burr Medico (oncology)- no inpatient work up at this time  Iatrogenic vs Acute blood loss anemia -Patient had normal colonoscopy 2-3 years ago -given 1uPRBCs 04/21/2015 -Hemoglobin 8.8 -Continue iron supplementation  SIRS/fever secondary to possible UTI vs pneumonia -CXR (04/19/2015) negative for infection  -UA: 3-6WBC, small leukocytes, positive nitrite -Urine culture pending (patient has no complaints of dysuria) -Cdiff PCR negative; flagyl discontinued -Leukocytosis resolved -CXR 04/22/15: interstitial densities in RUL-?infection vs inflammatory process -Will discharge patient with levaquin- patient should have a repeat chest xray to ensure resolution -Patient no longer complains of cough  Hypotension -Patient was given albumin, but seems to have chronically low BP -Continue to monitor closely  Bilateral DVT/PE -Continue lovenox  COPD -Stable, no wheezing  Malnutrition -Nutrition  consulted  Generalized weakness/deconditioning -Likely multifactorial: acute illness, malnutrition -PT consulted and recommended [facility]  Procedures  Paracentesis 04/15/2015  Consults  Interventional radiology  Discharge Exam: Filed Vitals:   04/23/15 0508  BP: 100/63  Pulse: 125  Temp: 98.3 F (36.8 C)  Resp: 16   Exam  General: Well developed, thin, no apparent distress  HEENT: NCAT, mucous membranes moist.   Cardiovascular: S1 S2 auscultated, RRR, no murmurs  Respiratory: Clear to auscultation bilaterally, no wheezing  Abdomen: Soft, nontender, nondistended, + bowel sounds  Extremities: warm dry without cyanosis clubbing. +LE edema B/L  Neuro: AAOx3, nonfocal  Discharge Instructions      Discharge Instructions    Discharge instructions    Complete by:  As directed   Patient will be discharged to home with home health PT.  Patient will need to follow up with primary care provider within one week of discharge, and should have a repeat CBC/CMP at that time.  Patient should also have a repeat Chest xray in 1 week.  Patient will also need to follow up with gastroenterology, Dr. Laural Golden, within 1-2 weeks of discharge.  Patient should continue medications as prescribed.  Patient should follow a low sodium diet diet.            Medication List    STOP taking these medications        predniSONE 5 MG tablet  Commonly known as:  DELTASONE      TAKE these medications        acetaminophen 500 MG tablet  Commonly known as:  TYLENOL  Take 500 mg by mouth every 6 (six) hours as needed for mild pain or moderate pain.     enoxaparin 100 MG/ML injection  Commonly known as:  LOVENOX  Inject 1 mL (100 mg total) into the skin daily.     feeding supplement (ENSURE ENLIVE) Liqd  Take 237 mLs by mouth 2 (two) times daily between meals.     ferrous sulfate 325 (65 FE) MG tablet  Take 325 mg by mouth 2 (two) times daily.     levofloxacin 500 MG tablet  Commonly known  as:  LEVAQUIN  Take 1 tablet (500 mg total) by mouth daily.     ondansetron 4 MG tablet  Commonly known as:  ZOFRAN  Take 1 tablet (4 mg total) by mouth every 8 (eight) hours as needed for nausea or vomiting.     Oxycodone HCl 10 MG Tabs  Take 1 tablet (10 mg total) by mouth every 4 (four) hours.     OXYGEN  Inhale 2 L into the lungs daily.     spironolactone 25 MG tablet  Commonly known as:  ALDACTONE  Take 25 mg by mouth daily.       No Known Allergies Follow-up Information    Follow up with [name]. Schedule an appointment as soon as possible for a visit in 1 week.   Why:  Hospital follow up   Contact information:   [place] [address] [phone]       Follow up with REHMAN,NAJEEB U, MD. Schedule an appointment as soon as possible for a visit in 1 week.   Specialty:  Gastroenterology   Why:  Hospital follow up, ascites    Contact information:   [place], [place] [address] [phone]       Follow up with Truitt Merle, MD. Schedule an appointment as soon as possible for a visit in 1 week.   Specialties:  Hematology, Oncology   Why:  Hospital follow up   Contact information:  [place] [address] [phone]        The results of significant diagnostics from this hospitalization (including imaging, microbiology, ancillary and laboratory) are listed below for reference.    Significant Diagnostic Studies: Dg Chest 1 View  04/22/2015   CLINICAL DATA:  Cough.  History of pancreatic cancer.  EXAM: CHEST  1 VIEW  COMPARISON:  04/19/2015 and 03/16/2015  FINDINGS: Prominent interstitial densities in the right upper lung compared to the prior examinations. Few prominent densities in the left perihilar region. Heart size is normal. Trachea is midline.  IMPRESSION: Prominent interstitial densities in the right upper lung are concerning for a subtle infectious or inflammatory process. Recommend follow up to  ensure resolution.   Electronically Signed   By: Markus Daft M.D.   On: 04/22/2015 13:11   Dg Chest 2 View  04/19/2015   CLINICAL DATA:  Followup pneumonia  EXAM: CHEST  2 VIEW  COMPARISON:  04/14/2015  FINDINGS: Cardiomediastinal silhouette is stable. No acute infiltrate or pleural effusion. No pulmonary edema. Mild right basilar atelectasis or scarring. Bony thorax is stable. Stable probable chronic mild interstitial prominence.  IMPRESSION: No active cardiopulmonary disease. Probable chronic mild interstitial prominence. Mild right basilar atelectasis or scarring.   Electronically Signed   By: Lahoma Crocker M.D.   On: 04/19/2015 10:02   Dg Chest 2 View  04/14/2015   CLINICAL DATA:  Leg swelling  EXAM: CHEST  2 VIEW  COMPARISON:  03/16/2015  FINDINGS: Small bilateral pleural effusion, greater on the right. The volume and appearance is similar to 03/16/2015. Mild atelectasis or scarring at the bases with a 14 mm nodular opacity at the peripheral right base which was described on abdominal CT 08/01/2014. Chest CT 03/17/2015 also showed nodular densities and three-month follow-up CT was suggested. No pneumonia, edema, or pneumothorax. Normal heart size and mediastinal contours.  IMPRESSION: Small bilateral pleural effusion.  No pulmonary edema.   Electronically Signed   By: Monte Fantasia M.D.   On: 04/14/2015 17:13   Ct Abdomen Pelvis W Contrast  04/14/2015   CLINICAL DATA:  Abdominal pain/swelling, history of pancreatic cancer status post Whipple 6 years ago, history of lung cancer  EXAM: CT ABDOMEN AND PELVIS WITH CONTRAST  TECHNIQUE: Multidetector CT imaging of the abdomen and pelvis was performed using the standard protocol following bolus administration of intravenous contrast.  CONTRAST:  135m OMNIPAQUE IOHEXOL 300 MG/ML  SOLN  COMPARISON:  CT abdomen pelvis dated 03/17/2015  FINDINGS: Lower chest: Moderate right pleural effusion. Trace fluid along the lateral left lung base. Minimal dependent  atelectasis bilaterally.  Hepatobiliary: Geographic heterogeneous perfusion inferiorly within the right hepatic lobe (series 2/image 26), unchanged, favored to reflect a perfusion anomaly/shunting. No suspicious hepatic lesions.  Status post cholecystectomy. Suspected choledochojejunostomy. No intrahepatic or extrahepatic ductal dilatation.  Pancreas: Status post resection of the pancreatic head/ uncinate process. Residual pancreatic body/ tail is within normal limits.  Spleen: Within normal limits.  Adrenals/Urinary Tract: Adrenal glands are within normal limits.  Kidneys are within normal limits.  No hydronephrosis.  Bladder is thick-walled although underdistended.  Stomach/Bowel: Status post partial gastrectomy with gastrojejunostomy.  No evidence of bowel obstruction.  Colon is mildly thick-walled but largely decompressed.  Vascular/Lymphatic: Atherosclerotic calcifications of the abdominal aorta and branch vessels.  No suspicious abdominopelvic lymphadenopathy.  Reproductive: Mild prostatomegaly.  Other: Large volume abdominopelvic ascites.  Musculoskeletal: Mild degenerative changes of the lumbar spine.  IMPRESSION: Postsurgical change related to prior Whipple procedure.  No  findings specific for recurrent or metastatic disease.  Large volume abdominopelvic ascites. Moderate right pleural effusion.  Additional ancillary findings as above.   Electronically Signed   By: Julian Hy M.D.   On: 04/14/2015 20:18   US Paracentesis  04/15/2015   CLINICAL DATA:  Abdominal distention and ascites. Request diagnostic and therapeutic paracentesis.  EXAM: ULTRASOUND GUIDED PARACENTESIS  COMPARISON:  None.  PROCEDURE: An ultrasound guided paracentesis was thoroughly discussed with the patient and questions answered. The benefits, risks, alternatives and complications were also discussed. The patient understands and wishes to proceed with the procedure. Written consent was obtained.  Ultrasound was performed to  localize and mark an adequate pocket of fluid in the right lower quadrant of the abdomen. The area was then prepped and draped in the normal sterile fashion. 1% Lidocaine was used for local anesthesia. Under ultrasound guidance a 19 gauge Yueh catheter was introduced. Paracentesis was performed. The catheter was removed and a dressing applied.  COMPLICATIONS: None immediate  FINDINGS: A total of approximately 2.4 L of clear yellow fluid was removed. A fluid sample was sent for laboratory analysis.  IMPRESSION: Successful ultrasound guided paracentesis yielding 2.4 L of ascites.  Read by: Ascencion Dike PA-C   Electronically Signed   By: Jacqulynn Cadet M.D.   On: 04/15/2015 13:50    Microbiology: Recent Results (from the past 240 hour(s))  Anaerobic culture     Status: None   Collection Time: 04/15/15  1:53 PM  Result Value Ref Range Status   Specimen Description PERITONEAL CAVITY  Final   Special Requests NONE  Final   Gram Stain   Final    NO WBC SEEN NO SQUAMOUS EPITHELIAL CELLS SEEN NO ORGANISMS SEEN Performed at [facility]    Culture   Final    NO ANAEROBES ISOLATED Performed at [facility]    Report Status 04/20/2015 FINAL  Final  Body fluid culture     Status: None   Collection Time: 04/15/15  1:54 PM  Result Value Ref Range Status   Specimen Description PERITONEAL CAVITY  Final   Special Requests NONE  Final   Gram Stain   Final    NO WBC SEEN NO ORGANISMS SEEN Performed at [facility]    Culture   Final    NO GROWTH 3 DAYS Performed at [facility]    Report Status 04/19/2015 FINAL  Final  Culture, blood (routine x 2)     Status: None (Preliminary result)   Collection Time: 04/21/15  3:38 PM  Result Value Ref Range Status   Specimen Description BLOOD RIGHT ANTECUBITAL  Final   Special Requests BOTTLES DRAWN AEROBIC AND ANAEROBIC 10CC 10CC  Final   Culture NO GROWTH < 24 HOURS  Final   Report Status PENDING  Incomplete  Culture,  blood (routine x 2)     Status: None (Preliminary result)   Collection Time: 04/21/15  3:46 PM  Result Value Ref Range Status   Specimen Description BLOOD RIGHT ARM  Final   Special Requests BOTTLES DRAWN AEROBIC AND ANAEROBIC 10CC EACH  Final   Culture NO GROWTH < 24 HOURS  Final   Report Status PENDING  Incomplete  Clostridium Difficile by PCR (not at [facility])     Status: None   Collection Time: 04/21/15 10:20 PM  Result Value Ref Range Status   C difficile by pcr NEGATIVE NEGATIVE Final     Labs: Basic Metabolic Panel:  Recent Labs Lab 04/18/15  5038 04/19/15 0515 04/20/15 0558 04/22/15 0400 04/23/15 0435  NA 132* 130* 134* 133* 135  K 4.0 3.9 3.7 4.2 4.3  CL 99* 98* 102 101 104  CO2 '26 24 27 26 27  '$ GLUCOSE 91 88 66 69 67  BUN <5* <5* <5* 5* <5*  CREATININE 0.70 0.69 0.67 0.62 0.69  CALCIUM 7.5* 7.5* 7.7* 7.7* 7.8*   Liver Function Tests:  Recent Labs Lab 04/17/15 0453 04/18/15 0420 04/19/15 0515 04/20/15 0558 04/23/15 0435  AST 43* 55* 60* 49* 32  ALT '24 28 31 27 22  '$ ALKPHOS 76 83 79 63 80  BILITOT 0.6 1.0 1.1 1.9* 1.7*  PROT 4.0* 3.9* 3.7* 3.9* 4.1*  ALBUMIN 2.1* 1.9* 2.0* 2.3* 2.1*   No results for input(s): LIPASE, AMYLASE in the last 168 hours. No results for input(s): AMMONIA in the last 168 hours. CBC:  Recent Labs Lab 04/20/15 0558 04/20/15 1604 04/21/15 0330 04/22/15 0400 04/23/15 0435  WBC 9.1 10.9* 10.2 12.1* 10.1  NEUTROABS 5.2 8.1* 6.5 8.4* 6.1  HGB 7.7* 9.4* 7.7* 9.2* 8.8*  HCT 21.4* 25.9* 21.6* 25.4* 24.4*  MCV 97.7 100.4* 99.5 94.4 94.9  PLT 195 231 190 213 214   Cardiac Enzymes: No results for input(s): CKTOTAL, CKMB, CKMBINDEX, TROPONINI in the last 168 hours. BNP: BNP (last 3 results)  Recent Labs  03/16/15 2030 04/14/15 1607  BNP 68.0 52.0    ProBNP (last 3 results) No results for input(s): PROBNP in the last 8760 hours.  CBG: No results for input(s): GLUCAP in the last 168 hours.     SignedCristal Ford  [facility] 04/23/2015, 10:27 AM

## 2015-04-23 NOTE — Progress Notes (Signed)
CM call to RN caring for patient to advise that [facility] is ordered and that CM will be there to speak to patient. CM called patient room and patient states that his daughter will be staying there with him. CM advised that [facility] PT was ordered and states that he has never had home health before. CM advised that a list of options/choice will be brought to him. Understanding verbalized. CM will continue to follow for needs.

## 2015-04-23 NOTE — Care Management Note (Signed)
Case Management Note  Patient Details  Name: Patrick Lee MRN: [id number] Date of Birth: 1961-10-11  Subjective/Objective:                  DX: Anasarca       Action/Plan: Discharge planning  Expected Discharge Date:  04/23/15               Expected Discharge Plan:  [place]  In-House Referral:     Discharge planning Services  CM Consult  Post Acute Care Choice:  Home Health Choice offered to:  Patient  DME Arranged:  N/A DME Agency:  NA  HH Arranged:  PT [place] Agency:  [place]  Status of Service:  Completed, signed off  Medicare Important Message Given:  Yes Date Medicare IM Given:  04/19/15 Medicare IM give by:  Ellan Lambert, RN, BSN  Date Additional Medicare IM Given:  04/23/15 Additional Medicare Important Message give by:  Marcy Panning, RN, BSN  If discussed at [employer], dates discussed:    Additional Comments: Cm spoke to patient at the bedside and explained choice of [facility] care agencies and pt specified that he would like [facility]. CM called [place] for referral for [facility] PT and referral accepted. CM also delivered Medicare IM at the bedside and the patient denies questions. The patient denies the need of any DME at home, states his brother will pick him up for d/c and his daughter will be staying with him at home and denies the need for additional assistance. No additional CM needs communicated.  Guido Sander, RN 04/23/2015, 11:17 AM

## 2015-04-23 NOTE — Progress Notes (Signed)
CM call to Jonelle Sidle, [facility] rep to advise of best contact for patient to be [phone].

## 2015-04-23 NOTE — Progress Notes (Signed)
Pt discharged to home.  Discharged instructions explained to pt.  Pt has no questions at time of discharge.  IV removed.  Pt taken down by wheelchair.

## 2015-04-25 ENCOUNTER — Encounter (HOSPITAL_COMMUNITY): Payer: Self-pay | Admitting: Oncology

## 2015-04-25 ENCOUNTER — Encounter (HOSPITAL_COMMUNITY): Payer: Medicare Other | Attending: Oncology | Admitting: Oncology

## 2015-04-25 VITALS — BP 91/69 | HR 94 | Temp 98.3°F | Resp 20 | Ht 71.5 in | Wt 131.5 lb

## 2015-04-25 DIAGNOSIS — D649 Anemia, unspecified: Secondary | ICD-10-CM | POA: Diagnosis not present

## 2015-04-25 DIAGNOSIS — R601 Generalized edema: Secondary | ICD-10-CM

## 2015-04-25 DIAGNOSIS — R188 Other ascites: Secondary | ICD-10-CM | POA: Diagnosis not present

## 2015-04-25 DIAGNOSIS — I82403 Acute embolism and thrombosis of unspecified deep veins of lower extremity, bilateral: Secondary | ICD-10-CM | POA: Diagnosis not present

## 2015-04-25 DIAGNOSIS — I2699 Other pulmonary embolism without acute cor pulmonale: Secondary | ICD-10-CM | POA: Diagnosis not present

## 2015-04-25 DIAGNOSIS — Z85118 Personal history of other malignant neoplasm of bronchus and lung: Secondary | ICD-10-CM | POA: Diagnosis not present

## 2015-04-25 DIAGNOSIS — Z8507 Personal history of malignant neoplasm of pancreas: Secondary | ICD-10-CM | POA: Diagnosis not present

## 2015-04-25 DIAGNOSIS — Z87891 Personal history of nicotine dependence: Secondary | ICD-10-CM | POA: Insufficient documentation

## 2015-04-25 LAB — COMPREHENSIVE METABOLIC PANEL
ALT: 24 U/L (ref 17–63)
AST: 45 U/L — ABNORMAL HIGH (ref 15–41)
Albumin: 2.8 g/dL — ABNORMAL LOW (ref 3.5–5.0)
Alkaline Phosphatase: 87 U/L (ref 38–126)
Anion gap: 11 (ref 5–15)
BUN: 7 mg/dL (ref 6–20)
CALCIUM: 8.3 mg/dL — AB (ref 8.9–10.3)
CO2: 23 mmol/L (ref 22–32)
Chloride: 100 mmol/L — ABNORMAL LOW (ref 101–111)
Creatinine, Ser: 0.99 mg/dL (ref 0.61–1.24)
GFR calc non Af Amer: >60 mL/min (ref >60)
GLUCOSE: 276 mg/dL — AB (ref 65–99)
Potassium: 3.9 mmol/L (ref 3.5–5.1)
Sodium: 134 mmol/L — ABNORMAL LOW (ref 135–145)
TOTAL PROTEIN: 6 g/dL — AB (ref 6.5–8.1)
Total Bilirubin: 1.4 mg/dL — ABNORMAL HIGH (ref 0.3–1.2)

## 2015-04-25 LAB — CBC WITH DIFFERENTIAL/PLATELET
BASOS ABS: 0 10*3/uL (ref 0.0–0.1)
Basophils Relative: 0 % (ref 0–1)
EOS ABS: 0 10*3/uL (ref 0.0–0.7)
Eosinophils Relative: 0 % (ref 0–5)
HEMATOCRIT: 30.9 % — AB (ref 39.0–52.0)
Hemoglobin: 10.8 g/dL — ABNORMAL LOW (ref 13.0–17.0)
Lymphocytes Relative: 10 % — ABNORMAL LOW (ref 12–46)
Lymphs Abs: 1.6 10*3/uL (ref 0.7–4.0)
MCH: 34.7 pg — ABNORMAL HIGH (ref 26.0–34.0)
MCHC: 35 g/dL (ref 30.0–36.0)
MCV: 99.4 fL (ref 78.0–100.0)
Monocytes Absolute: 1.2 10*3/uL — ABNORMAL HIGH (ref 0.1–1.0)
Monocytes Relative: 7 % (ref 3–12)
Neutro Abs: 12.9 10*3/uL — ABNORMAL HIGH (ref 1.7–7.7)
Neutrophils Relative %: 83 % — ABNORMAL HIGH (ref 43–77)
PLATELETS: 298 10*3/uL (ref 150–400)
RBC: 3.11 MIL/uL — ABNORMAL LOW (ref 4.22–5.81)
RDW: 15 % (ref 11.5–15.5)
WBC: 15.7 10*3/uL — AB (ref 4.0–10.5)

## 2015-04-25 LAB — IRON AND TIBC
Iron: 56 ug/dL (ref 45–182)
TIBC: <70 ug/dL — ABNORMAL LOW (ref 250–450)

## 2015-04-25 LAB — C-REACTIVE PROTEIN: CRP: 4.8 mg/dL — AB (ref <1.0)

## 2015-04-25 LAB — RETICULOCYTES
RBC.: 3.11 MIL/uL — ABNORMAL LOW (ref 4.22–5.81)
Retic Count, Absolute: 43.5 10*3/uL (ref 19.0–186.0)
Retic Ct Pct: 1.4 % (ref 0.4–3.1)

## 2015-04-25 LAB — TSH: TSH: 2.578 u[IU]/mL (ref 0.350–4.500)

## 2015-04-25 LAB — FERRITIN: FERRITIN: 459 ng/mL — AB (ref 24–336)

## 2015-04-25 LAB — SEDIMENTATION RATE: SED RATE: 30 mm/h — AB (ref 0–16)

## 2015-04-25 LAB — VITAMIN B12: Vitamin B-12: 1187 pg/mL — ABNORMAL HIGH (ref 180–914)

## 2015-04-25 LAB — LACTATE DEHYDROGENASE: LDH: 207 U/L — ABNORMAL HIGH (ref 98–192)

## 2015-04-25 LAB — FOLATE: FOLATE: 12.4 ng/mL (ref >5.9)

## 2015-04-25 MED ORDER — ENOXAPARIN SODIUM 100 MG/ML ~~LOC~~ SOLN: 100.0000 mg | SUBCUTANEOUS | Status: DC

## 2015-04-25 NOTE — Progress Notes (Addendum)
[facility] Hematology/Oncology Consultation   Name: Patrick Lee      MRN: [id number]    Location: Room/bed info not found  Date: 04/25/2015 Time:2:54 PM   REFERRING PHYSICIAN:  Wendie Lee  REASON FOR CONSULT:  Anemia in the setting of anticoagulation for VTE.   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   I personally reviewed and went over laboratory results with the patient.  The results are noted within this dictation.  I personally reviewed and went over radiographic studies with the patient.  The results are noted within this dictation.   Chart reviewed.  The patient's story is that he has recovered nicely from his Whipple Procedure (only about 5% of patient with pancreatic cancer, all-comers, survive 5 years) that was surgically resected by Patrick Lee at [facility] and subsequent wedge resection for adenocarcinoma of lung in June 2013 until June 2015 when he started to notice a decline in his performance status, appetite, and weight secondary to nausea with vomiting.  Of note, prior to September 2015, his Hgb was WNL.  In June 2015, he weight 178 lbs and he now is down to 131.5 lbs.  In May 2016, he presented to the ED with shortness of breath at which time he was found to have a PE and B/L LE DVTs.  He denies any recent long travel or surgeries.  He denies any significant risk factors for VTE except a history of malignancy, history of tobacco abuse, and sedentary lifestyle.  He was started on Lovenox  anticoagulation and then re-presented to [facility] with unexplained ascites.  2.4 L of clear yellow fluid was drained from abdomen.  He notes that his appetite increased and his breathing improved subsequently.  He was referred to hematology for VTE and anemia evaluation, but his case is much more complicated than that.  He admits to drenching night sweats requiring him to change clothes.  His weight loss is very significant and his unexplained ascites is worrisome, in addition to his unprovoked VTE and severe anemia.    On review, he was started on iron tablets, but he is demonstrating intolerance to oral iron.  I have asked him to stop this medication.  I do not see any record of ferritin level.  I do see iron/TIBC studies by his primary care provider demonstrating an anemia of chronic disease-like picture without frank evidence of iron deficiency.  He does have an EtOH and tobacco abuse history.  Details can be found in his social history.   He denies any chest pain, blood in stool, dark stools, hematuria, urinary complaints, dizziness, double vision, headaches, confusion.  He does note some diffuse abdominal pain. He has an upcoming appointment with GI on 7/8.  Additionally, I have reviewed his surgical oncology notes from [facility] and the patient is noted to have an elevated CA 19-9 in December 2015.  This has not yet been addressed to my knowledge.  PAST MEDICAL HISTORY:   Past Medical History  Diagnosis  Date  . Lateral epicondylitis   . Chest pain, unspecified   . Abdominal pain, other specified site   . Obstruction of bile duct   . Tobacco use disorder   . Anxiety   . Shortness of breath     uses oxygen at night- 2 liters   . Myocardial infarction 2007    during [place]- 2007  . Complication of anesthesia     pt. reports that he had MI while under anesth. for Whipple procedure  . Pancreatic cancer      status post Whipple procedure.  . Lung cancer      ALLERGIES: No Known Allergies    MEDICATIONS: I have reviewed the patient's current medications.    Current Outpatient Prescriptions on File Prior to Visit  Medication Sig Dispense Refill  . acetaminophen (TYLENOL) 500 MG tablet Take 500 mg by mouth every 6 (six) hours as needed for mild pain or moderate pain.    Marland Kitchen enoxaparin (LOVENOX) 100 MG/ML injection Inject 1 mL (100 mg total) into the skin daily. 30 Syringe 0  . feeding supplement, ENSURE ENLIVE, (ENSURE ENLIVE) LIQD Take 237 mLs by mouth 2 (two) times daily between meals. 237 mL 12  . ferrous sulfate 325 (65 FE) MG tablet Take 325 mg by mouth 2 (two) times daily.    Marland Kitchen levofloxacin (LEVAQUIN) 500 MG tablet Take 1 tablet (500 mg total) by mouth daily. 5 tablet 0  . ondansetron (ZOFRAN) 4 MG tablet Take 1 tablet (4 mg total) by mouth every 8 (eight) hours as needed for nausea or vomiting. 30 tablet 0  . oxyCODONE 10 MG TABS Take 1 tablet (10 mg total) by mouth every 4 (four) hours. 25 tablet 0  . OXYGEN Inhale 2 L into the lungs daily.    Marland Kitchen spironolactone (ALDACTONE) 25 MG tablet Take 25 mg by mouth daily.     No current facility-administered medications on file prior to visit.     PAST SURGICAL HISTORY Past Surgical History  Procedure Laterality Date  . Whipple procedure  2007  . Percutaneous extraction of gallstones      [facility]  . Knee arthroscopy      R knee- at [facility]  . Cholecystectomy      open  . Video bronchoscopy  04/08/2012    Procedure: VIDEO BRONCHOSCOPY;  Surgeon: Patrick Pollack, MD;  Location: [facility] OR;  Service: Thoracic;  Laterality: N/A;    FAMILY HISTORY: Family History  Problem Relation Age of Onset  . Stroke Father 62    Deceased  . Anesthesia problems Neg Hx    Patient has 4 children, 3 sons (ages 61, 29, and 72) who are healthy and 1 daughter who is 74 years old (healthy).  His mother is 109 years old and she is healthy and still working.  His father is 84 and recently had a stroke.  He  is divorced x 2.  SOCIAL HISTORY: He notes that he quit drinking EtOH about 1 year ago, but used to drink 1 case of beer weekly for many years, more when he was younger.  He also has a 40 pack year smoking history quitting recently.  He has a distant history of dip.  He denies any illicit drug abuse.  He is currently disabled from his pancreatic cancer treatment, he reports, and used to work at [employer] as a Merchant navy officer.  PERFORMANCE STATUS: The patient's performance status is 3 - Symptomatic, >50% confined to  bed  PHYSICAL EXAM: Most Recent Vital Signs: There were no vitals taken for this visit. General appearance: alert, cooperative, appears older than stated age and mild distress Head: Normocephalic, without obvious abnormality, atraumatic Eyes: negative findings: lids and lashes normal, conjunctivae and sclerae normal, corneas clear and pupils equal, round, reactive to light and accomodation, positive findings: small pupils Throat: normal findings: lips normal without lesions, buccal mucosa normal, palate normal, tongue midline and normal and soft palate, uvula, and tonsils normal Neck: no adenopathy, supple, symmetrical, trachea midline and thyroid not enlarged, symmetric, no tenderness/mass/nodules Lungs: clear to auscultation bilaterally and normal percussion bilaterally Heart: regular rate and rhythm, S1, S2 normal, no murmur, click, rub or gallop Abdomen: normal findings: bowel sounds normal, no masses palpable, no organomegaly and soft and abnormal findings:  moderate tenderness in the entire abdomen Extremities: edema B/L 2-3+ pitting pretibially. Skin: Skin color, texture, turgor normal. No rashes or lesions Lymph nodes: Cervical, supraclavicular, and axillary nodes normal. Neurologic: Grossly normal  LABORATORY DATA:  CBC    Component Value Date/Time   WBC 10.1 04/23/2015 0435   WBC 5.2 02/22/2013 1635   RBC 2.57* 04/23/2015 0435   RBC 4.2* 02/22/2013 1635   HGB  8.8* 04/23/2015 0435   HGB 15.0 02/22/2013 1635   HCT 24.4* 04/23/2015 0435   HCT 42.0* 02/22/2013 1635   PLT 214 04/23/2015 0435   MCV 94.9 04/23/2015 0435   MCV 100.0* 02/22/2013 1635   MCH 34.2* 04/23/2015 0435   MCH 35.6* 02/22/2013 1635   MCHC 36.1* 04/23/2015 0435   MCHC 35.6* 02/22/2013 1635   RDW 15.4 04/23/2015 0435   LYMPHSABS 3.0 04/23/2015 0435   MONOABS 0.9 04/23/2015 0435   EOSABS 0.1 04/23/2015 0435   BASOSABS 0.1 04/23/2015 0435      Chemistry      Component Value Date/Time   NA 135 04/23/2015 0435   K 4.3 04/23/2015 0435   CL 104 04/23/2015 0435   CO2 27 04/23/2015 0435   BUN <5* 04/23/2015 0435   CREATININE 0.69 04/23/2015 0435   CREATININE 1.05 02/22/2013 1622      Component Value Date/Time   CALCIUM 7.8* 04/23/2015 0435   ALKPHOS 80 04/23/2015 0435   AST 32 04/23/2015 0435   ALT 22 04/23/2015 0435   BILITOT 1.7* 04/23/2015 0435       RADIOGRAPHY: In CHL   PATHOLOGY:   IN CHL  ASSESSMENT/PLAN:   Acute pulmonary embolism Old appearing 54 year old white American man who was referred to [facility] for DVT and anemia, but on further review, the patient has a much more difficult situation than initially thought.  He is currently anticoagulated with Lovenox and I have refilled that Rx and asked him to continue 1.5 mg/kg dose daily.  Anemia is severe and not completely worked-up.  He was started on oral iron supplementation and he notes increased abdominal pain and discomfort, thus I have asked him to stop his iron pill particularly in light of an anemia of chronic disease-like picture on his iron studies without a ferritin level.  Over the past year, Mr. Nixon has developed a severe anemia, favoring macrocytosis, with > 45 lbs weight loss, new ascites requiring drainage of 2.4 L of fluid 2-3 weeks ago, VTE (PE and B/L DVT).  All of this has not been fully explained at this time.  In addition, he has a history of pancreatic cancer treated surgically by Dr.  Jyl Lee in 2008 with a whipple procedure and Stage I  adenocarcinoma of right lower lobe treated surgically by Patrick Lee in 2013.    CareEverywhere is reviewed and Dr. Lyda Jester notations are appreciated.  In Dec 2015, a CA 19-9 was elevated at 79.  All of these findings are concerning for an underlying malignancy.    Labs today: CBC diff, CMET, anemia panel, LDH, Haptoglobin, Retic count, TSH, MM panel, ESR, CRP, CA 19-9.  Stool cards x 3.  PET imaging ordered to evaluate for malignancy, particularly carcinomatosis since CT imaging is not the best at looking for this diagnosis (in light of symptoms and recent ascites).  Continue Lovenox at dose of 1.5 mg/kg daily.  New Rx for 30 more days provided while we work-up all other issues.  Return following PET scan to review data.     All questions were answered. The patient knows to call the clinic with any problems, questions or concerns. We can certainly see the patient much sooner if necessary.  Patient and plan discussed with Dr. Ancil Linsey and she is in agreement with the aforementioned.   More than 50% of the time spent with the patient was utilized for counseling and coordination of care.  This note is electronically signed by: Doy Mince 04/25/2015 2:54 PM    AS above. Patient seen and examined.  He appears chronically ill. His history is complicated but certainly his ascites, prior pancreatic carcinoma, DVT and weight loss are concerning for recurrence.  His also has an ETOH history and will be following up with GI, he does not report a known history of cirrhosis.  We will perform an anemia panel today, I have recommended a PET to evaluate for possible recurrence of either of his prior malignancies. He is to continue on anticoagulation. We will follow-up and review all results with additional recommendations to follow. Donald Pore MD

## 2015-04-25 NOTE — Assessment & Plan Note (Signed)
Old appearing 54 year old white American man who was referred to [facility] for DVT and anemia, but on further review, the patient has a much more difficult situation than initially thought.  He is currently anticoagulated with Lovenox and I have refilled that Rx and asked him to continue 1.5 mg/kg dose daily.  Anemia is severe and not completely worked-up.  He was started on oral iron supplementation and he notes increased abdominal pain and discomfort, thus I have asked him to stop his iron pill particularly in light of an anemia of chronic disease-like picture on his iron studies without a ferritin level.  Over the past year, Patrick Lee has developed a severe anemia, favoring macrocytosis, with > 45 lbs weight loss, new ascites requiring drainage of 2.4 L of fluid 2-3 weeks ago, VTE (PE and B/L DVT).  All of this has not been fully explained at this time.  In addition, he has a history of pancreatic cancer treated surgically by Dr. Jyl Heinz in 2008 with a whipple procedure and Stage I adenocarcinoma of right lower lobe treated surgically by Dr. Cyndia Bent in 2013.    CareEverywhere is reviewed and Dr. Lyda Jester notations are appreciated.  In Dec 2015, a CA 19-9 was elevated at 79.  All of these findings are concerning for an underlying malignancy.    Labs today: CBC diff, CMET, anemia panel, LDH, Haptoglobin, Retic count, TSH, MM panel, ESR, CRP, CA 19-9.  Stool cards x 3.  PET imaging ordered to evaluate for malignancy, particularly carcinomatosis since CT imaging is not the best at looking for this diagnosis (in light of symptoms and recent ascites).  Continue Lovenox at dose of 1.5 mg/kg daily.  New Rx for 30 more days provided while we work-up all other issues.  Return following PET scan to review data.

## 2015-04-25 NOTE — Patient Instructions (Addendum)
[place] at [facility] Discharge Instructions  RECOMMENDATIONS MADE BY THE CONSULTANT AND ANY TEST RESULTS WILL BE SENT TO YOUR REFERRING PHYSICIAN.  Exam and discussion by Robynn Pane, PA-C and Dr. Ancil Linsey. We need to get some additional blood work, get a PET scan and check some stool cards for blood. Continue your lovenox.  We will see you back after the PET Scan. PET scan - will be done at [facility].  Nothing to eat or drink for 6 hours prior to the test.  Do not eat or drink anything with sugar in it.  No candy or chewing gum    Thank you for choosing [place] at [facility] to provide your oncology and hematology care.  To afford each patient quality time with our provider, please arrive at least 15 minutes before your scheduled appointment time.    You need to re-schedule your appointment should you arrive 10 or more minutes late.  We strive to give you quality time with our providers, and arriving late affects you and other patients whose appointments are after yours.  Also, if you no show three or more times for appointments you may be dismissed from the clinic at the providers discretion.     Again, thank you for choosing [facility].  Our hope is that these requests will decrease the amount of time that you wait before being seen by our physicians.       _____________________________________________________________  Should you have questions after your visit to [facility], please contact our office at [phone] between the hours of 8:30 a.m. and 4:30 p.m.  Voicemails left after 4:30 p.m. will not be returned until the following business day.  For prescription refill requests, have your pharmacy contact our office.    Fecal Occult Blood Test This is a test done on a stool specimen to screen for gastrointestinal bleeding, which may be an indicator of colon cancer Is is usually done  as part of a routine examination, annually, after age 70 or as directed by your caregiver. The fecal occult blood test (FOBT) checks for blood in your stool. Normally, there will not be enough blood lost through the gastrointestinal tract to turn an FOBT positive or for you to notice it visually in the form of bloody or dark, tarry stools. Any significant amount of blood being passed should be investigated.  A positive FOBT will tell your caregiver that you have bleeding occurring somewhere in your gastrointestinal tract. This blood loss could be due to ulcers, diverticulosis, bleeding polyps, inflammatory bowel disease, hemorrhoids, from swallowed blood due to bleeding gums or nosebleeds, or it could be due to benign or cancerous tumors. Anything that protrudes into the lumen (the empty space in the intestine), like a polyp or tumor, and is rubbed against by the fecal waste as it passes through has the potential to eventually bleed intermittently. Often this small amount of blood is the first, and sometimes the only, symptom of early colon cancer, making the FOBT a valuable screening tool. PREPARATION FOR TEST  You should not eat red meat within three days before testing. Other substances that could cause a false positive test result include fish, turnips, horseradish, and drugs such as colchicines and oxidizing drugs (for example, iodine and boric acid). Be sure to carefully follow your caregiver's instructions. With FOBT, your caregiver or laboratory will give you one or more test "cards." You collect  a separate sample from three different stools, usually on consecutive days. Each stool sample should be collected into a clean container and should not be contaminated with urine or water. The slide is labeled with your name and the date; then, with an applicator stick, you apply a thin smear of stool onto each filter paper square/window contained on the card. Allow the filter paper to dry. Once it is dry, it  is stable. Usually you will collect all of the consecutive samples, and then return all of them to your caregiver or laboratory at the same time, sometimes by mailing them. There are also over the counter tests which are dropped in your toilet. NORMAL FINDINGS   No occult blood within the stool.  The FOBT test is normally negative. A positive indicates either blood in the stool or an interfering substance. Multiple samples are done to: 1) catch intermittent bleeding; and 2) help rule out false positives. Ranges for normal findings may vary among different laboratories and hospitals. You should always check with your doctor after having lab work or other tests done to discuss the meaning of your test results and whether your values are considered within normal limits. MEANING OF TEST  Your caregiver will go over the test results with you and discuss the importance and meaning of your results, as well as treatment options and the need for additional tests if necessary. OBTAINING THE TEST RESULTS  It is your responsibility to obtain your test results. Ask the lab or department performing the test when and how you will get your results. Document Released: 11/15/2004 Document Revised: 01/13/2012 Document Reviewed: 09/30/2008 [facility] Patient Information 2015 [place], [place]. This information is not intended to replace advice given to you by your health care provider. Make sure you discuss any questions you have with your health care provider.

## 2015-04-26 LAB — KAPPA/LAMBDA LIGHT CHAINS
Kappa free light chain: 40.27 mg/L — ABNORMAL HIGH (ref 3.30–19.40)
Kappa, lambda light chain ratio: 0.67 (ref 0.26–1.65)
Lambda free light chains: 60.12 mg/L — ABNORMAL HIGH (ref 5.71–26.30)

## 2015-04-26 LAB — CULTURE, BLOOD (ROUTINE X 2)
CULTURE: NO GROWTH
Culture: NO GROWTH

## 2015-04-26 LAB — BETA 2 MICROGLOBULIN, SERUM: BETA 2 MICROGLOBULIN: 2.1 mg/L (ref 0.6–2.4)

## 2015-04-26 LAB — HAPTOGLOBIN: Haptoglobin: 78 mg/dL (ref 34–200)

## 2015-04-27 ENCOUNTER — Other Ambulatory Visit (HOSPITAL_COMMUNITY): Payer: Self-pay | Admitting: Nurse Practitioner

## 2015-04-27 ENCOUNTER — Other Ambulatory Visit (HOSPITAL_COMMUNITY): Payer: Medicare Other | Admitting: *Deleted

## 2015-04-27 ENCOUNTER — Ambulatory Visit (HOSPITAL_COMMUNITY)
Admission: RE | Admit: 2015-04-27 | Discharge: 2015-04-27 | Disposition: A | Discharge status: Home / Self Care | Payer: Medicare Other | Source: Ambulatory Visit | Attending: Nurse Practitioner | Admitting: Nurse Practitioner

## 2015-04-27 DIAGNOSIS — D649 Anemia, unspecified: Secondary | ICD-10-CM

## 2015-04-27 DIAGNOSIS — J189 Pneumonia, unspecified organism: Secondary | ICD-10-CM | POA: Diagnosis not present

## 2015-04-27 DIAGNOSIS — J029 Acute pharyngitis, unspecified: Secondary | ICD-10-CM | POA: Insufficient documentation

## 2015-04-27 DIAGNOSIS — Z87891 Personal history of nicotine dependence: Secondary | ICD-10-CM | POA: Insufficient documentation

## 2015-04-27 LAB — MULTIPLE MYELOMA PANEL, SERUM
ALBUMIN SERPL ELPH-MCNC: 2.6 g/dL — AB (ref 2.9–4.4)
ALBUMIN/GLOB SERPL: 1.2 (ref 0.7–1.7)
Alpha 1: 0.2 g/dL (ref 0.0–0.4)
Alpha2 Glob SerPl Elph-Mcnc: 0.3 g/dL — ABNORMAL LOW (ref 0.4–1.0)
B-Globulin SerPl Elph-Mcnc: 0.6 g/dL — ABNORMAL LOW (ref 0.7–1.3)
Gamma Glob SerPl Elph-Mcnc: 1.1 g/dL (ref 0.4–1.8)
Globulin, Total: 2.2 g/dL (ref 2.2–3.9)
IGA: 528 mg/dL — AB (ref 90–386)
IGG (IMMUNOGLOBIN G), SERUM: 986 mg/dL (ref 700–1600)
IGM, SERUM: 78 mg/dL (ref 20–172)
M Protein SerPl Elph-Mcnc: 0.2 g/dL — ABNORMAL HIGH
TOTAL PROTEIN ELP: 4.8 g/dL — AB (ref 6.0–8.5)

## 2015-04-27 LAB — OCCULT BLOOD X 1 CARD TO LAB, STOOL
FECAL OCCULT BLD: NEGATIVE
FECAL OCCULT BLD: POSITIVE — AB
Fecal Occult Bld: NEGATIVE

## 2015-05-03 ENCOUNTER — Ambulatory Visit (HOSPITAL_COMMUNITY)
Admission: RE | Admit: 2015-05-03 | Discharge: 2015-05-03 | Disposition: A | Discharge status: Home / Self Care | Payer: Medicare Other | Source: Ambulatory Visit | Attending: Oncology | Admitting: Oncology

## 2015-05-03 DIAGNOSIS — D649 Anemia, unspecified: Secondary | ICD-10-CM | POA: Diagnosis not present

## 2015-05-03 DIAGNOSIS — C259 Malignant neoplasm of pancreas, unspecified: Secondary | ICD-10-CM | POA: Insufficient documentation

## 2015-05-03 DIAGNOSIS — C3490 Malignant neoplasm of unspecified part of unspecified bronchus or lung: Secondary | ICD-10-CM | POA: Diagnosis not present

## 2015-05-03 DIAGNOSIS — I2510 Atherosclerotic heart disease of native coronary artery without angina pectoris: Secondary | ICD-10-CM | POA: Diagnosis not present

## 2015-05-03 DIAGNOSIS — R918 Other nonspecific abnormal finding of lung field: Secondary | ICD-10-CM | POA: Insufficient documentation

## 2015-05-03 DIAGNOSIS — J90 Pleural effusion, not elsewhere classified: Secondary | ICD-10-CM | POA: Diagnosis not present

## 2015-05-03 DIAGNOSIS — R188 Other ascites: Secondary | ICD-10-CM

## 2015-05-03 DIAGNOSIS — Z87891 Personal history of nicotine dependence: Secondary | ICD-10-CM | POA: Diagnosis not present

## 2015-05-03 DIAGNOSIS — Z8507 Personal history of malignant neoplasm of pancreas: Secondary | ICD-10-CM

## 2015-05-03 DIAGNOSIS — R601 Generalized edema: Secondary | ICD-10-CM

## 2015-05-03 DIAGNOSIS — Z86711 Personal history of pulmonary embolism: Secondary | ICD-10-CM | POA: Insufficient documentation

## 2015-05-03 DIAGNOSIS — J439 Emphysema, unspecified: Secondary | ICD-10-CM | POA: Diagnosis not present

## 2015-05-03 DIAGNOSIS — I82403 Acute embolism and thrombosis of unspecified deep veins of lower extremity, bilateral: Secondary | ICD-10-CM

## 2015-05-03 DIAGNOSIS — Z86718 Personal history of other venous thrombosis and embolism: Secondary | ICD-10-CM | POA: Insufficient documentation

## 2015-05-03 DIAGNOSIS — Z85118 Personal history of other malignant neoplasm of bronchus and lung: Secondary | ICD-10-CM

## 2015-05-03 DIAGNOSIS — I2699 Other pulmonary embolism without acute cor pulmonale: Secondary | ICD-10-CM

## 2015-05-03 LAB — GLUCOSE, CAPILLARY: Glucose-Capillary: 114 mg/dL — ABNORMAL HIGH (ref 65–99)

## 2015-05-03 MED ORDER — FLUDEOXYGLUCOSE F - 18 (FDG) INJECTION
6.5200 | Freq: Once | INTRAVENOUS | Status: AC | PRN
  Administered 2015-05-03: 6.52 via INTRAVENOUS

## 2015-05-05 ENCOUNTER — Ambulatory Visit (HOSPITAL_COMMUNITY)
Admission: RE | Admit: 2015-05-05 | Discharge: 2015-05-05 | Disposition: A | Discharge status: Home / Self Care | Payer: Medicare Other | Source: Ambulatory Visit | Attending: Oncology | Admitting: Oncology

## 2015-05-05 ENCOUNTER — Encounter (HOSPITAL_COMMUNITY): Payer: Medicare Other | Attending: Hematology & Oncology | Admitting: Hematology & Oncology

## 2015-05-05 ENCOUNTER — Encounter (HOSPITAL_COMMUNITY): Payer: Self-pay | Admitting: Hematology & Oncology

## 2015-05-05 VITALS — BP 94/75 | HR 116 | Temp 98.3°F | Resp 18 | Wt 137.6 lb

## 2015-05-05 DIAGNOSIS — R17 Unspecified jaundice: Secondary | ICD-10-CM

## 2015-05-05 DIAGNOSIS — R700 Elevated erythrocyte sedimentation rate: Secondary | ICD-10-CM

## 2015-05-05 DIAGNOSIS — I2699 Other pulmonary embolism without acute cor pulmonale: Secondary | ICD-10-CM | POA: Diagnosis not present

## 2015-05-05 DIAGNOSIS — I82403 Acute embolism and thrombosis of unspecified deep veins of lower extremity, bilateral: Secondary | ICD-10-CM | POA: Insufficient documentation

## 2015-05-05 DIAGNOSIS — Z85118 Personal history of other malignant neoplasm of bronchus and lung: Secondary | ICD-10-CM | POA: Insufficient documentation

## 2015-05-05 DIAGNOSIS — C259 Malignant neoplasm of pancreas, unspecified: Secondary | ICD-10-CM | POA: Diagnosis not present

## 2015-05-05 DIAGNOSIS — D638 Anemia in other chronic diseases classified elsewhere: Secondary | ICD-10-CM

## 2015-05-05 DIAGNOSIS — D649 Anemia, unspecified: Secondary | ICD-10-CM | POA: Insufficient documentation

## 2015-05-05 DIAGNOSIS — R188 Other ascites: Secondary | ICD-10-CM

## 2015-05-05 DIAGNOSIS — Z8507 Personal history of malignant neoplasm of pancreas: Secondary | ICD-10-CM | POA: Insufficient documentation

## 2015-05-05 DIAGNOSIS — E8809 Other disorders of plasma-protein metabolism, not elsewhere classified: Secondary | ICD-10-CM

## 2015-05-05 DIAGNOSIS — D6489 Other specified anemias: Secondary | ICD-10-CM

## 2015-05-05 DIAGNOSIS — Z87891 Personal history of nicotine dependence: Secondary | ICD-10-CM | POA: Insufficient documentation

## 2015-05-05 NOTE — Progress Notes (Signed)
[facility] Hematology/Oncology Consultation   Name: Patrick Lee      MRN: [id number]    Location: Room/bed info not found  Date: 05/21/2015 Time:9:47 AM   REFERRING PHYSICIAN:  Wendie Simmer  REASON FOR CONSULT:  Anemia in the setting of anticoagulation for VTE.   DIAGNOSIS:  Macrocytic, normochromic anemia with normal WBC and platelet count in the setting of anticoagulation with Lovenox for VTE  HISTORY OF PRESENT ILLNESS:   Patrick Lee is a 54 year old white American man with a past medical history significant for pancreatic cancer s/p whipple procedure in 2008 by Dr. 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   The patient's story is that he has recovered nicely from his Whipple Procedure (only about 5% of patient with pancreatic cancer, all-comers, survive 5 years) that was surgically resected by Dr. Jyl Heinz at [facility] and subsequent wedge resection for adenocarcinoma of lung in June 2013 until June 2015 when he started to notice a decline in his performance status, appetite, and weight secondary to nausea with vomiting.  Of note, prior to September 2015, his Hgb was WNL.  In June 2015, he weight 178 lbs and he now is down to 131.5 lbs.  In May 2016, he presented to the ED with shortness of breath at which time he was found to have a PE and B/L LE DVTs.  He denies any recent long travel or surgeries.  He denies any significant risk factors for VTE except a history of malignancy, history of tobacco abuse, and sedentary lifestyle.  He was started on Lovenox anticoagulation and then re-presented to [facility] with unexplained ascites.  2.4 L of clear yellow fluid was drained from abdomen.  He notes that his appetite increased and his breathing improved subsequently.  He was referred to hematology for VTE and anemia  evaluation, but his case is much more complicated than that.  He admits to drenching night sweats requiring him to change clothes.  His weight loss is very significant and his unexplained ascites is worrisome, in addition to his unprovoked VTE and severe anemia.    On review, he was started on iron tablets, but he is demonstrating intolerance to oral iron.  I have asked him to stop this medication.  I do not see any record of ferritin level.  I do see iron/TIBC studies by his primary care provider demonstrating an anemia of chronic disease-like picture without frank evidence of iron deficiency.  He does have an EtOH and tobacco abuse history.  Details can be found in his social history.   He denies any chest pain, blood in stool, dark stools, hematuria, urinary complaints, dizziness, double vision, headaches, confusion.  He does note some diffuse abdominal pain. He has an upcoming appointment with GI on 7/8.  Additionally, I have reviewed his surgical oncology notes from [facility] and the patient is noted to have an elevated CA 19-9 in December 2015.  He is here today to review the results of his CT scan, stool cards, and blood work. He is here with a family member. He ambulates with the assistance of a cane.  He has been getting around better lately and his appetite has increased. His family member mentioned he eats about 4-6 times a day now. He notes however that his weight may  be up several pounds because his leg swelling is worse.  He is considering buying compression stockings from [facility] to help with his legs swelling. He mentions sometimes after he sits for a long time it is hard to get back up  PAST MEDICAL HISTORY:   Past Medical History  Diagnosis Date  . Lateral epicondylitis   . Chest pain, unspecified   . Abdominal pain, other specified site   . Obstruction of bile duct   . Tobacco use disorder   . Anxiety   . Shortness of breath     uses oxygen at night- 2 liters   .  Myocardial infarction 2007    during [place]- 2007  . Complication of anesthesia     pt. reports that he had MI while under anesth. for Whipple procedure  . Pancreatic cancer      status post Whipple procedure.  . Lung cancer     ALLERGIES: No Known Allergies    MEDICATIONS: I have reviewed the patient's current medications.    Current Outpatient Prescriptions on File Prior to Visit  Medication Sig Dispense Refill  . acetaminophen (TYLENOL) 500 MG tablet Take 500 mg by mouth every 6 (six) hours as needed for mild pain or moderate pain.    Marland Kitchen enoxaparin (LOVENOX) 100 MG/ML injection Inject 1 mL (100 mg total) into the skin daily. 30 Syringe 0  . feeding supplement, ENSURE ENLIVE, (ENSURE ENLIVE) LIQD Take 237 mLs by mouth 2 (two) times daily between meals. 237 mL 12  . ferrous sulfate 325 (65 FE) MG tablet Take 325 mg by mouth 2 (two) times daily.    . ondansetron (ZOFRAN) 4 MG tablet Take 1 tablet (4 mg total) by mouth every 8 (eight) hours as needed for nausea or vomiting. 30 tablet 0  . oxyCODONE 10 MG TABS Take 1 tablet (10 mg total) by mouth every 4 (four) hours. 25 tablet 0  . OXYGEN Inhale 2 L into the lungs as needed.      No current facility-administered medications on file prior to visit.     PAST SURGICAL HISTORY Past Surgical History  Procedure Laterality Date  . Whipple procedure  2007  . Percutaneous extraction of gallstones      [facility]  . Knee arthroscopy      R knee- at [facility]  . Cholecystectomy      open  . Video bronchoscopy  04/08/2012    Procedure: VIDEO BRONCHOSCOPY;  Surgeon: Gaye Pollack, MD;  Location: [facility] OR;  Service: Thoracic;  Laterality: N/A;  . Partial right lung removal    . Colonoscopy  May 2014    Dr. Anthony Sar: normal     FAMILY HISTORY: Family History  Problem Relation Age of Onset  . Stroke Father 34    Deceased  . Anesthesia problems Neg Hx   . Colon cancer Neg Hx   . Cirrhosis Neg Hx    Patient  has 4 children, 3 sons (ages 63, 45, and 71) who are healthy and 1 daughter who is 46 years old (healthy).  His mother is 31 years old and she is healthy and still working.  His father is 6 and recently had a stroke.  He is divorced x 2.  SOCIAL HISTORY: He notes that he quit drinking EtOH about 1 year ago, but used to drink 1 case of beer weekly for many years, more when he was younger.  He also has a 40 pack  year smoking history quitting recently.  He has a distant history of dip.  He denies any illicit drug abuse.  He is currently disabled from his pancreatic cancer treatment, he reports, and used to work at [employer] as a Merchant navy officer.  PERFORMANCE STATUS: The patient's performance status is 3 - Symptomatic, >50% confined to bed  ROS Positive for edema. In ankles and abdomen. Complains of leg swelling and abdominal swelling Weakness 14 point review of systems was performed and is negative except as detailed under history of present illness and above   PHYSICAL EXAM: Most Recent Vital Signs: Blood pressure 94/75, pulse 116, temperature 98.3 F (36.8 C), temperature source Oral, resp. rate 18, weight 137 lb 9.6 oz (62.415 kg), SpO2 100 %. General appearance: alert, cooperative, appears older than stated age and mild distress Head: Normocephalic, without obvious abnormality, atraumatic Eyes: negative findings: lids and lashes normal, conjunctivae and sclerae normal, corneas clear and pupils equal, round, reactive to light and accomodation, positive findings: small pupils Throat: normal findings: lips normal without lesions, buccal mucosa normal, palate normal, tongue midline and normal and soft palate, uvula, and tonsils normal Neck: no adenopathy, supple, symmetrical, trachea midline and thyroid not enlarged, symmetric, no tenderness/mass/nodules Lungs: clear to auscultation bilaterally and normal percussion bilaterally Heart: regular rate and rhythm, S1, S2 normal, no murmur,  click, rub or gallop Abdomen:no palpable masses, thin with slight distension  moderate tenderness in the entire abdomen Extremities: edema B/L 2-3+ pitting pretibially. Skin: Skin color, texture, turgor normal. No rashes or lesions Lymph nodes: Cervical, supraclavicular, and axillary nodes normal. Neurologic: Grossly normal General: Ambulates with assistance of a cane.   LABORATORY DATA:  Results for SOHUM, DELILLO (MRN [id number])   Ref. Range 04/25/2015 17:28  Sodium Latest Ref Range: 135-145 mmol/L 134 (L)  Potassium Latest Ref Range: 3.5-5.1 mmol/L 3.9  Chloride Latest Ref Range: 101-111 mmol/L 100 (L)  CO2 Latest Ref Range: 22-32 mmol/L 23  BUN Latest Ref Range: 6-20 mg/dL 7  Creatinine Latest Ref Range: 0.61-1.24 mg/dL 0.99  Calcium Latest Ref Range: 8.9-10.3 mg/dL 8.3 (L)  EGFR (Non-African Amer.) Latest Ref Range: >60 mL/min >60  EGFR (African American) Latest Ref Range: >60 mL/min >60  Glucose Latest Ref Range: 65-99 mg/dL 276 (H)  Anion gap Latest Ref Range: 5-15  11  Alkaline Phosphatase Latest Ref Range: 38-126 U/L 87  Albumin Latest Ref Range: 3.5-5.0 g/dL 2.8 (L)  AST Latest Ref Range: 15-41 U/L 45 (H)  ALT Latest Ref Range: 17-63 U/L 24  Total Protein Latest Ref Range: 6.5-8.1 g/dL 6.0 (L)  Total Bilirubin Latest Ref Range: 0.3-1.2 mg/dL 1.4 (H)  LDH Latest Ref Range: 98-192 U/L 207 (H)  Iron Latest Ref Range: 45-182 ug/dL 56  UIBC Latest Units: ug/dL NOT CALCULATED  TIBC Latest Ref Range: 250-450 ug/dL <70 (L)  Saturation Ratios Latest Ref Range: 17.9-39.5 % NOT CALCULATED  Ferritin Latest Ref Range: 24-336 ng/mL 459 (H)  Folate Latest Ref Range: >5.9 ng/mL 12.4  Beta-2 Microglobulin Latest Ref Range: 0.6-2.4 mg/L 2.1  CRP Latest Ref Range: <1.0 mg/dL 4.8 (H)  Vitamin B-12 Latest Ref Range: 180-914 pg/mL 1187 (H)  Total Protein ELP Latest Ref Range: 6.0-8.5 g/dL 4.8 (L)  Albumin SerPl Elph-Mcnc Latest Ref Range: 2.9-4.4 g/dL 2.6 (L)  Albumin/Glob SerPl Latest  Ref Range: 0.7-1.7  1.2  Alpha2 Glob SerPl Elph-Mcnc Latest Ref Range: 0.4-1.0 g/dL 0.3 (L)  Alpha 1 Latest Ref Range: 0.0-0.4 g/dL 0.2  Gamma Glob SerPl Elph-Mcnc Latest Ref Range:  0.4-1.8 g/dL 1.1  IFE 1 Unknown Comment  Globulin, Total Latest Ref Range: 2.2-3.9 g/dL 2.2  B-Globulin SerPl Elph-Mcnc Latest Ref Range: 0.7-1.3 g/dL 0.6 (L)  IgG (Immunoglobin G), Serum Latest Ref Range: 480-134-0171 mg/dL 986  IgA Latest Ref Range: 90-386 mg/dL 528 (H)  IgM, Serum Latest Ref Range: 20-172 mg/dL 78  Kappa free light chain Latest Ref Range: 3.30-19.40 mg/L 40.27 (H)  Lamda free light chains Latest Ref Range: 5.71-26.30 mg/L 60.12 (H)  Kappa, lamda light chain ratio Latest Ref Range: 0.26-1.65  0.67  M Protein SerPl Elph-Mcnc Latest Ref Range: Not Observed g/dL 0.2 (H)  WBC Latest Ref Range: 4.0-10.5 K/uL 15.7 (H)  RBC Latest Ref Range: 4.22-5.81 MIL/uL 3.11 (L)  Hemoglobin Latest Ref Range: 13.0-17.0 g/dL 10.8 (L)  HCT Latest Ref Range: 39.0-52.0 % 30.9 (L)  MCV Latest Ref Range: 78.0-100.0 fL 99.4  MCH Latest Ref Range: 26.0-34.0 pg 34.7 (H)  MCHC Latest Ref Range: 30.0-36.0 g/dL 35.0  RDW Latest Ref Range: 11.5-15.5 % 15.0  Platelets Latest Ref Range: 150-400 K/uL 298  Neutrophils Latest Ref Range: 43-77 % 83 (H)  Lymphocytes Latest Ref Range: 12-46 % 10 (L)  Monocytes Relative Latest Ref Range: 3-12 % 7  Eosinophil Latest Ref Range: 0-5 % 0  Basophil Latest Ref Range: 0-1 % 0  NEUT# Latest Ref Range: 1.7-7.7 K/uL 12.9 (H)  Lymphocyte # Latest Ref Range: 0.7-4.0 K/uL 1.6  Monocyte # Latest Ref Range: 0.1-1.0 K/uL 1.2 (H)  Eosinophils Absolute Latest Ref Range: 0.0-0.7 K/uL 0.0  Basophils Absolute Latest Ref Range: 0.0-0.1 K/uL 0.0  RBC. Latest Ref Range: 4.22-5.81 MIL/uL 3.11 (L)  Retic Ct Pct Latest Ref Range: 0.4-3.1 % 1.4  Retic Count, Manual Latest Ref Range: 19.0-186.0 K/uL 43.5  Haptoglobin Latest Ref Range: 34-200 mg/dL 78  Sed Rate Latest Ref Range: 0-16 mm/hr 30 (H)  TSH  Latest Ref Range: 0.350-4.500 uIU/mL 2.578  Please Note (HCV): Unknown Comment      RADIOGRAPHY: CLINICAL DATA: Subsequent treatment strategy for pancreatic and lung cancer ; weight loss, and night sweats.  EXAM: NUCLEAR MEDICINE PET SKULL BASE TO THIGH  TECHNIQUE: 6.5 mCi F-18 FDG was injected intravenously. Full-ring PET imaging was performed from the skull base to thigh after the radiotracer. CT data was obtained and used for attenuation correction and anatomic localization.  FASTING BLOOD GLUCOSE: Value: 114 mg/dl  COMPARISON: Multiple exams, including 04/14/2015 and 01/23/2012  FINDINGS: NECK  No hypermetabolic lymph nodes in the neck.  CHEST  Increase in lingular nodular atelectasis/scarring compared to the exam from 19 days ago, with faint hypermetabolic uptake in this vicinity, maximum standard uptake value 2.1. Moderate right pleural effusion. Nodularity peripherally in both lower lobes, on the right side with maximum standard uptake value 1.4 and on the left with maximum standard uptake value 1.5, likely from atelectasis/inflammation.  Coronary artery atherosclerosis. Mild paraseptal emphysema.  ABDOMEN/PELVIS  Prior Whipple. Extensive ascites. There is some physiologic areas of high bowel activity. In the right abdomen just above the iliac crests, there is high activity not corresponding to a visible mass or loop of bowel, maximum standard uptake value 11.0. It is possible that this represents physiologic activity in a loop of bowel which moved between the PET and CT and position. Again, I do not observe a definite soft tissue mass in this vicinity along the ascites.  Aortoiliac atherosclerotic vascular disease. Prior Whipple.  SKELETON  No focal hypermetabolic activity to suggest skeletal metastasis.  IMPRESSION: 1. Extensive ascites. No  compelling findings of peritoneal carcinomatosis despite this ascites. Reason focus of high  activity just above the iliac crest to the right abdomen which does not have a definite CT correlate, and which I suspect probably represents a loop of bowel which moved between the PET images and the CT images. 2. Bibasilar atelectasis/scarring, standard uptake value below thresholds for malignancy suspicion. 3. Coronary artery atherosclerosis. 4. Mild paraseptal emphysema.   Electronically Signed  By: Van Clines M.D.  On: 05/03/2015 11:53   PATHOLOGY:   IN [facility]  ASSESSMENT/PLAN:  Anemia PE with bilateral LE DVT's, on anticoagulation Myeloma panel with small M-spike at 0.2 gm/dl, igg monoclonal protein with lambda light chain specificity, normal kappa lamda light chain ratio Normal B12, folate Iron studies c/w anemia of chronic disease Hypoalbuminemia, elevated bilirubin, low total protein Mild elevation of ESR, elevated CRP Normal TSH Hemoccult positive X 1 Significant Weight Loss  His clinical picture is certainly complicated.  The cause of his ascites is unclear. He does not have documented liver disease. I have recommended repeating an abdominal ultrasound and if possible obtaining additional fluid for cytology. He had pancreatic cancer but this was resected greater than 5 years ago. He has had significant weight loss over the past 2 years. Interestingly he has always had normal CA 19-9 at [facility], his last CA 19-9 in December of 2015 was elevated at 79.82.  Imaging this far has been benign, but of course not perfect for detecting all carcinomatosis.   Other than one hemeocult positive stool, the cause of his anemia is not completely clear. Discussed having a bone marrow biopsy done. He would like to have it done here and would prefer not to drive to [place]. Will schedule this to be done within the next 2 weeks. I would consider a GI referral as well. Note that he continues on anticoagulation for his DVT's/PE.  I will see him back to perform the BMBX. Plan is  detailed above.  All questions were answered. The patient knows to call the clinic with any problems, questions or concerns. We can certainly see the patient much sooner if necessary.  This document serves as a record of services personally performed by Ancil Linsey, MD. It was created on her behalf by Arlyce Harman, a trained medical scribe. The creation of this record is based on the scribe's personal observations and the provider's statements to them. This document has been checked and approved by the attending provider.  I have reviewed the above documentation for accuracy and completeness, and I agree with the above.  This note is electronically signed by: Molli Hazard, MD 05/21/2015 9:47 AM

## 2015-05-05 NOTE — Patient Instructions (Signed)
[place] at [facility] Discharge Instructions  RECOMMENDATIONS MADE BY THE CONSULTANT AND ANY TEST RESULTS WILL BE SENT TO YOUR REFERRING PHYSICIAN.  Exam and discussion by Dr. Whitney Muse Will send you down to xray for ultrasound guided paracentesis. Want you to come back in 2 weeks for labs Bone marrow biopsy and aspiraton in 3 weeks. Follow-up after bone marrow biopsy.  Thank you for choosing [place] at [facility] to provide your oncology and hematology care.  To afford each patient quality time with our provider, please arrive at least 15 minutes before your scheduled appointment time.    You need to re-schedule your appointment should you arrive 10 or more minutes late.  We strive to give you quality time with our providers, and arriving late affects you and other patients whose appointments are after yours.  Also, if you no show three or more times for appointments you may be dismissed from the clinic at the providers discretion.     Again, thank you for choosing [facility].  Our hope is that these requests will decrease the amount of time that you wait before being seen by our physicians.       _____________________________________________________________  Should you have questions after your visit to [facility], please contact our office at [phone] between the hours of 8:30 a.m. and 4:30 p.m.  Voicemails left after 4:30 p.m. will not be returned until the following business day.  For prescription refill requests, have your pharmacy contact our office.   \

## 2015-05-05 NOTE — Progress Notes (Signed)
Paracentesis complete no signs of distress. 2000 ml yellow colored ascites removed.

## 2015-05-11 ENCOUNTER — Telehealth (HOSPITAL_COMMUNITY): Payer: Self-pay

## 2015-05-11 NOTE — Telephone Encounter (Signed)
A user error has taken place: encounter opened in error, closed for administrative reasons.

## 2015-05-12 ENCOUNTER — Ambulatory Visit (INDEPENDENT_AMBULATORY_CARE_PROVIDER_SITE_OTHER): Payer: Medicare Other | Admitting: Gastroenterology

## 2015-05-12 ENCOUNTER — Encounter: Payer: Self-pay | Admitting: Gastroenterology

## 2015-05-12 ENCOUNTER — Other Ambulatory Visit: Payer: Self-pay

## 2015-05-12 VITALS — BP 97/74 | HR 108 | Temp 98.1°F | Ht 71.0 in | Wt 142.4 lb

## 2015-05-12 DIAGNOSIS — Z79899 Other long term (current) drug therapy: Secondary | ICD-10-CM

## 2015-05-12 DIAGNOSIS — R1011 Right upper quadrant pain: Secondary | ICD-10-CM | POA: Diagnosis not present

## 2015-05-12 MED ORDER — SPIRONOLACTONE 25 MG PO TABS: 50.0000 mg | ORAL_TABLET | Freq: Every day | ORAL | Status: DC

## 2015-05-12 MED ORDER — FUROSEMIDE 20 MG PO TABS: 20.0000 mg | ORAL_TABLET | Freq: Every day | ORAL | Status: DC

## 2015-05-12 NOTE — Patient Instructions (Signed)
We have scheduled you for a special ultrasound of your liver.   I have changed your fluid pills to this: Increase aldactone to 50 mg each morning Start taking lasix 20 mg each morning.   YOU WILL NEED REPEAT BLOOD WORK IN 1 WEEK after starting these fluid pills to make sure your kidneys are doing well with them.  Further recommendations to follow shortly.

## 2015-05-12 NOTE — Progress Notes (Signed)
Primary Care Physician:  [name] Primary Gastroenterologist:  Dr. Oneida Alar   Chief Complaint  Patient presents with  . Abdominal Pain    HPI:   Patrick Lee is a 54 y.o. male presenting today at the request of his PCP secondary to ascites and RUQ discomfort. He has recently seen Dr. Whitney Muse in consultation of anemia in the setting of anticoagulation. He has a history significant for pancreatic cancer s/p whipple procedure in 2008 by Dr. 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 May 2016 diagnosed with PE and bilateral DVTs.   Notes RUQ pain for weeks. Worse with ascites.   Last colonoscopy in May 2014 by Dr. Anthony Sar was normal.  EGD about 6 months ago at [facility], but I am not able to obtain these reports at time of visit. RUQ pain constant. Worsened with eating/drinking especially with worsening ascites. No ETOH, back in day heavy ETOH. No drug use. Tattoo on shoulder. No constipation or diarrhea. Sometimes paper hematochezia, states "it's hemorrhoids". No melena. HEME POSITIVE. Lost 45 lbs since last June 2015. Appetite is getting better. Had "bad" indigestion yesterday and used an OTC agent. Had an episode yesterday but this is quite rare. Gets swelling in ankles but now on compression hose, which is quite helpful.   Past Medical History  Diagnosis Date  . Lateral epicondylitis   . Chest pain, unspecified   . Abdominal pain, other specified site   . Obstruction of bile duct   . Tobacco use disorder   . Anxiety   . Shortness of breath     uses oxygen at night- 2 liters   . Myocardial infarction 2007    during [place]- 2007  . Complication of anesthesia     pt. reports that he had MI while under anesth. for Whipple procedure  . Pancreatic cancer      status post Whipple procedure.  . Lung cancer     Past  Surgical History  Procedure Laterality Date  . Whipple procedure  2007  . Percutaneous [facility] gallstones      [facility]  . Knee arthroscopy      R knee- at [facility]  . Cholecystectomy      open  . Video bronchoscopy  04/08/2012    Procedure: VIDEO BRONCHOSCOPY;  Surgeon: Gaye Pollack, MD;  Location: [facility] OR;  Service: Thoracic;  Laterality: N/A;  . Partial right lung removal    . Colonoscopy  May 2014    Dr. Anthony Sar: normal     Current Outpatient Prescriptions  Medication Sig Dispense Refill  . acetaminophen (TYLENOL) 500 MG tablet Take 500 mg by mouth every 6 (six) hours as needed for mild pain or moderate pain.    Marland Kitchen enoxaparin (LOVENOX) 100 MG/ML injection Inject 1 mL (100 mg total) into the skin daily. 30 Syringe 0  . feeding supplement, ENSURE ENLIVE, (ENSURE ENLIVE) LIQD Take 237 mLs by mouth 2 (two) times daily between meals. 237 mL 12  . ferrous sulfate 325 (65 FE) MG tablet Take 325 mg by mouth 2 (two) times daily.    . ondansetron (ZOFRAN) 4 MG tablet Take 1 tablet (4 mg total) by mouth every 8 (eight) hours as needed for nausea or vomiting. 30 tablet 0  . oxyCODONE 10 MG TABS Take 1 tablet (10 mg total) by mouth  every 4 (four) hours. 25 tablet 0  . OXYGEN Inhale 2 L into the lungs as needed.     Marland Kitchen spironolactone (ALDACTONE) 25 MG tablet Take 2 tablets (50 mg total) by mouth daily. With lasix 60 tablet 3  . furosemide (LASIX) 20 MG tablet Take 1 tablet (20 mg total) by mouth daily. With aldactone in the morning. 30 tablet 3   No current facility-administered medications for this visit.    Allergies as of 05/12/2015  . (No Known Allergies)    Family History  Problem Relation Age of Onset  . Stroke Father 42    Deceased  . Anesthesia problems Neg Hx   . Colon cancer Neg Hx   . Cirrhosis Neg Hx     History   Social History  . Marital Status: Divorced    Spouse Name: N/A  . Number of Children: 4  . Years of Education: N/A   Occupational History    . UNEMPLOYED    Social History Main Topics  . Smoking status: Former Smoker -- 1.00 packs/day for 28 years    Types: Cigarettes    Quit date: 01/02/2012  . Smokeless tobacco: Never Used  . Alcohol Use: 0.0 oz/week    0 Standard drinks or equivalent per week     Comment:  Rare alcohol use  . Drug Use: No     Comment: History of illicit substance abuse (marijuna)  . Sexual Activity: Not on file   Other Topics Concern  . Not on file   Social History Narrative    Review of Systems: Gen: see HPI CV: Denies chest pain, heart palpitations, peripheral edema, syncope.  Resp: +DOE GI: see HPI GU : Denies urinary burning, urinary frequency, urinary hesitancy MS: +weakness Derm: Denies rash, itching, dry skin Psych: Denies depression, anxiety, memory loss, and confusion Heme: Denies bruising, bleeding, and enlarged lymph nodes.  Physical Exam: BP 97/74 mmHg  Pulse 108  Temp(Src) 98.1 F (36.7 C)  Ht '5\' 11"'$  (1.803 m)  Wt 142 lb 6.4 oz (64.592 kg)  BMI 19.87 kg/m2 General:   Alert and oriented. Pleasant and cooperative. Appears chronically ill.  Head:  Normocephalic and atraumatic. Eyes:  Without icterus, sclera clear and conjunctiva pink.  Ears:  Normal auditory acuity. Nose:  No deformity, discharge,  or lesions. Lungs:  Clear to auscultation bilaterally. No wheezes, rales, or rhonchi. No distress.  Heart:  S1, S2 present without murmurs appreciated.  Abdomen:  +BS, soft, rounded, no evidence of tense ascites, mild discomfort RUQ  Rectal:  Deferred  Msk:  Symmetrical without gross deformities. Normal posture. Extremities:  1+ edema Neurologic:  Alert and  oriented x4;  grossly normal neurologically. Psych:  Alert and cooperative. Normal mood and affect.    Negative Hep C in 2014   Lab Results  Component Value Date   ALT 24 04/25/2015   AST 45* 04/25/2015   ALKPHOS 87 04/25/2015   BILITOT 1.4* 04/25/2015   Lab Results  Component Value Date   CREATININE 0.99  04/25/2015   BUN 7 04/25/2015   NA 134* 04/25/2015   K 3.9 04/25/2015   CL 100* 04/25/2015   CO2 23 04/25/2015

## 2015-05-15 ENCOUNTER — Other Ambulatory Visit (HOSPITAL_COMMUNITY): Payer: Self-pay | Admitting: Oncology

## 2015-05-15 ENCOUNTER — Ambulatory Visit (HOSPITAL_COMMUNITY)
Admission: RE | Admit: 2015-05-15 | Discharge: 2015-05-15 | Disposition: A | Discharge status: Home / Self Care | Payer: Medicare Other | Source: Ambulatory Visit | Attending: Oncology | Admitting: Oncology

## 2015-05-15 DIAGNOSIS — R188 Other ascites: Secondary | ICD-10-CM | POA: Insufficient documentation

## 2015-05-15 NOTE — Progress Notes (Signed)
Paracentesis complete no signs of distress. 2000 ml yellow colored ascites removed.

## 2015-05-17 NOTE — Discharge Summary (Signed)
Physician Discharge Summary  Patrick Lee BMW:[id number] DOB: 08-30-1961 DOA: 04/14/2015  PCP: [facility] date: 04/14/2015 Discharge date: 05/17/2015  Time spent: 45 minutes  Recommendations for Outpatient Follow-up:  Patient will be discharged to home with home health PT.  Patient will need to follow up with primary care provider within one week of discharge, and should have a repeat CBC/CMP at that time.  Patient should also have a repeat Chest xray in 1 week.  Patient will also need to follow up with gastroenterology, Dr. Laural Golden, within 1-2 weeks of discharge.  Patient should continue medications as prescribed.  Patient should follow a low sodium diet diet.   Discharge Diagnoses:  New-onset ascites last third spacing/leg swelling History of pancreatic cancer Iatrogenic versus acute blood loss anemia SIRS/fever possibly secondary to ?UTI/pneumonia Hypotension Bilateral DVT/PE COPD Malnutrition Generalized weakness/deconditioning  Discharge Condition: Stable  Diet recommendation: Low sodium   Filed Weights   04/21/15 0604 04/22/15 0540 04/23/15 0508  Weight: 59.467 kg (131 lb 1.6 oz) 61.236 kg (135 lb) 59.557 kg (131 lb 4.8 oz)    History of present illness:  on 04/14/2015 by Dr. Shanda Howells This is a 54 y.o. year old male with significant past medical history of pancreatic cancer s/p whipple procedure, lung cancer, DVT and PE on lovenox, CAD presenting with anasarca. Patient reports progressive abdominal and lower extremity swelling over the past 2-3 weeks. Has seen his PCP about symptoms. Was placed on spironolactone for treatment. Patient reports her minimal improvement in symptoms. Denies any NSAID use. Reports decreased salt intake over the time frame. Denies any orthopnea or PND. Denies any alcohol abuse. Reports this is been a subacute on chronic issue for several months. Nonsmoker. Has been compliant with anticoagulation regimen. Presented to the  ER afebrile, hemodynamically stable. CBC grossly within normal limits. CMP grossly within normal limits. Lipase within normal limits. BNP within normal limits at 52. CT of the abdomen and pelvis shows postsurgical changes secondary to will procedure. No findings of recurrent metastatic disease. Large volume abdominopelvic ascites and moderate right pleural effusion.  Hospital Course:  New onset Ascites/third spacing/Leg swelling -Etiology unknown, Possibly secondary to recurrent pancreatic malignancy -Patient states he has had leg swelling for about 1 year -SAAG <1.1, ddx: carcinomatosis, tuberculosis, pancreatitis, serositis, or nephrosis -Hepatitis Panel negative -Body fluid culture show no grwoth -Continue diuresis, lasix and aldactone -Echcoardiogram 03/17/2015: EF 27-25%, diastolic function normal -Patient will follow up with Dr. Laural Golden in [place]  History of pancreatic cancer -CA 19-9: 78 -AFP 1.3 -CEA 5.6 -Patient follows with oncology in [place] -Dr. Verlon Au spoke with Dr. Burr Medico (oncology)- no inpatient work up at this time  Iatrogenic vs Acute blood loss anemia -Patient had normal colonoscopy 2-3 years ago -given 1uPRBCs 04/21/2015 -Hemoglobin 8.8 -Continue iron supplementation  SIRS/fever secondary to possible UTI vs pneumonia -CXR (04/19/2015) negative for infection  -UA: 3-6WBC, small leukocytes, positive nitrite -Urine culture pending (patient has no complaints of dysuria) -Cdiff PCR negative; flagyl discontinued -Leukocytosis resolved -CXR 04/22/15: interstitial densities in RUL-?infection vs inflammatory process -Will discharge patient with levaquin- patient should have a repeat chest xray to ensure resolution -Patient no longer complains of cough  Hypotension -Patient was given albumin, but seems to have chronically low BP -Continue to monitor closely  Bilateral DVT/PE -Continue lovenox  COPD -Stable, no wheezing  Malnutrition -Nutrition  consulted  Generalized weakness/deconditioning -Likely multifactorial: acute illness, malnutrition -PT consulted and recommended [facility]  Procedures  Paracentesis 04/15/2015  Consults  Interventional radiology  Discharge Exam: Filed Vitals:   04/23/15 1451  BP: 93/70  Pulse: 102  Temp: 97.8 F (36.6 C)  Resp: 16   Exam  General: Well developed, thin, no apparent distress  HEENT: NCAT, mucous membranes moist.   Cardiovascular: S1 S2 auscultated, RRR, no murmurs  Respiratory: Clear to auscultation bilaterally, no wheezing  Abdomen: Soft, nontender, nondistended, + bowel sounds  Extremities: warm dry without cyanosis clubbing. +LE edema B/L  Neuro: AAOx3, nonfocal  Discharge Instructions      Discharge Instructions    Discharge instructions    Complete by:  As directed   Patient will be discharged to home with home health PT.  Patient will need to follow up with primary care provider within one week of discharge, and should have a repeat CBC/CMP at that time.  Patient should also have a repeat Chest xray in 1 week.  Patient will also need to follow up with gastroenterology, Dr. Laural Golden, within 1-2 weeks of discharge.  Patient should continue medications as prescribed.  Patient should follow a low sodium diet diet.            Medication List    STOP taking these medications        enoxaparin 100 MG/ML injection  Commonly known as:  LOVENOX     levofloxacin 750 MG tablet  Commonly known as:  LEVAQUIN     predniSONE 5 MG tablet  Commonly known as:  DELTASONE     spironolactone 25 MG tablet  Commonly known as:  ALDACTONE      TAKE these medications        acetaminophen 500 MG tablet  Commonly known as:  TYLENOL  Take 500 mg by mouth every 6 (six) hours as needed for mild pain or moderate pain.     feeding supplement (ENSURE ENLIVE) Liqd  Take 237 mLs by mouth 2 (two) times daily between meals.     ferrous sulfate 325 (65 FE) MG tablet  Take 325 mg by  mouth 2 (two) times daily.     ondansetron 4 MG tablet  Commonly known as:  ZOFRAN  Take 1 tablet (4 mg total) by mouth every 8 (eight) hours as needed for nausea or vomiting.     Oxycodone HCl 10 MG Tabs  Take 1 tablet (10 mg total) by mouth every 4 (four) hours.     OXYGEN  Inhale 2 L into the lungs as needed.       No Known Allergies Follow-up Information    Follow up with [name]. Schedule an appointment as soon as possible for a visit in 1 week.   Why:  Hospital follow up   Contact information:   [place] [id number] [phone]       Follow up with REHMAN,NAJEEB U, MD. Schedule an appointment as soon as possible for a visit in 1 week.   Specialty:  Gastroenterology   Why:  Hospital follow up, ascites    Contact information:   [place], [place] [address] [phone]       Follow up with Truitt Merle, MD. Schedule an appointment as soon as possible for a visit in 1 week.   Specialties:  Hematology, Oncology   Why:  Hospital follow up   Contact information:   [place]  [id number] [phone]        The results of significant diagnostics from this hospitalization (including imaging,  microbiology, ancillary and laboratory) are listed below for reference.    Significant Diagnostic Studies: Dg Chest 1 View  04/22/2015   CLINICAL DATA:  Cough.  History of pancreatic cancer.  EXAM: CHEST  1 VIEW  COMPARISON:  04/19/2015 and 03/16/2015  FINDINGS: Prominent interstitial densities in the right upper lung compared to the prior examinations. Few prominent densities in the left perihilar region. Heart size is normal. Trachea is midline.  IMPRESSION: Prominent interstitial densities in the right upper lung are concerning for a subtle infectious or inflammatory process. Recommend follow up to ensure resolution.   Electronically Signed   By: Markus Daft M.D.   On: 04/22/2015 13:11   Dg Chest 2 View  04/28/2015    CLINICAL DATA:  Pneumonia.  Followup.  Sore throat.  Ex-smoker.  EXAM: CHEST  2 VIEW  COMPARISON:  04/22/2015  FINDINGS: Mild hyperinflation. Far right lateral chest is excluded on the frontal radiograph. Midline trachea. Normal heart size. Small bilateral pleural effusions. No pneumothorax. The right upper lobe airspace disease is resolved. Chronic medial right lung base volume loss is similar back to 04/13/2012 and may be postoperative. Clear left lung.  IMPRESSION: Resolved right upper lobe airspace disease/ pneumonia  Small bilateral pleural effusions.  Volume loss at the right lung base, likely postoperative (patient is status post wedge resection of a right lower lobe adenocarcinoma).   Electronically Signed   By: Abigail Miyamoto M.D.   On: 04/28/2015 08:25   Dg Chest 2 View  04/19/2015   CLINICAL DATA:  Followup pneumonia  EXAM: CHEST  2 VIEW  COMPARISON:  04/14/2015  FINDINGS: Cardiomediastinal silhouette is stable. No acute infiltrate or pleural effusion. No pulmonary edema. Mild right basilar atelectasis or scarring. Bony thorax is stable. Stable probable chronic mild interstitial prominence.  IMPRESSION: No active cardiopulmonary disease. Probable chronic mild interstitial prominence. Mild right basilar atelectasis or scarring.   Electronically Signed   By: Lahoma Crocker M.D.   On: 04/19/2015 10:02   Nm Pet Image Initial (pi) Skull Base To Thigh  05/03/2015   CLINICAL DATA:  Subsequent treatment strategy for pancreatic and lung cancer ; weight loss, and night sweats.  EXAM: NUCLEAR MEDICINE PET SKULL BASE TO THIGH  TECHNIQUE: 6.5 mCi F-18 FDG was injected intravenously. Full-ring PET imaging was performed from the skull base to thigh after the radiotracer. CT data was obtained and used for attenuation correction and anatomic localization.  FASTING BLOOD GLUCOSE:  Value: 114 mg/dl  COMPARISON:  Multiple exams, including 04/14/2015 and 01/23/2012  FINDINGS: NECK  No hypermetabolic lymph nodes in the neck.   CHEST  Increase in lingular nodular atelectasis/scarring compared to the exam from 19 days ago, with faint hypermetabolic uptake in this vicinity, maximum standard uptake value 2.1. Moderate right pleural effusion. Nodularity peripherally in both lower lobes, on the right side with maximum standard uptake value 1.4 and on the left with maximum standard uptake value 1.5, likely from atelectasis/inflammation.  Coronary artery atherosclerosis.  Mild paraseptal emphysema.  ABDOMEN/PELVIS  Prior Whipple. Extensive ascites. There is some physiologic areas of high bowel activity. In the right abdomen just above the iliac crests, there is high activity not corresponding to a visible mass or loop of bowel, maximum standard uptake value 11.0. It is possible that this represents physiologic activity in a loop of bowel which moved between the PET and CT and position. Again, I do not observe a definite soft tissue mass in this vicinity along the ascites.  Aortoiliac atherosclerotic vascular  disease.  Prior Whipple.  SKELETON  No focal hypermetabolic activity to suggest skeletal metastasis.  IMPRESSION: 1. Extensive ascites. No compelling findings of peritoneal carcinomatosis despite this ascites. Reason focus of high activity just above the iliac crest to the right abdomen which does not have a definite CT correlate, and which I suspect probably represents a loop of bowel which moved between the PET images and the CT images. 2. Bibasilar atelectasis/scarring, standard uptake value below thresholds for malignancy suspicion. 3. Coronary artery atherosclerosis. 4. Mild paraseptal emphysema.   Electronically Signed   By: Van Clines M.D.   On: 05/03/2015 11:53   US Paracentesis  05/15/2015   CLINICAL DATA:  Ascites of unknown etiology.  EXAM: ULTRASOUND GUIDED PARACENTESIS  COMPARISON:  CT scan dated 04/14/2015  PROCEDURE: An ultrasound guided paracentesis was thoroughly discussed with the patient and questions answered.  The benefits, risks, alternatives and complications were also discussed. The patient understands and wishes to proceed with the procedure. Written consent was obtained.  Ultrasound was performed to localize and mark an adequate pocket of fluid in the left mid abdomen. The area was then prepped and draped in the normal sterile fashion. 1% Lidocaine was used for local anesthesia. Under ultrasound guidance a 19 gauge Yueh catheter was introduced. Paracentesis was performed. The catheter was removed and a dressing applied.  COMPLICATIONS: None.  FINDINGS: A total of approximately 2000 cc of light yellow fluid was removed. A fluid sample was not sent for laboratory analysis.  IMPRESSION: Successful ultrasound guided paracentesis yielding 2000 cc of ascites.   Electronically Signed   By: Lorriane Shire M.D.   On: 05/15/2015 16:30   US Paracentesis  05/05/2015   CLINICAL DATA:  Pancreatic cancer, ascites  EXAM: ULTRASOUND GUIDED DIAGNOSTIC AND THERAPEUTIC PARACENTESIS  COMPARISON:  04/14/2105  PROCEDURE: Procedure, benefits, and risks of procedure were discussed with patient.  Written informed consent for procedure was obtained.  Time out protocol followed.  Adequate collection of ascites localized by ultrasound in LEFT lower quadrant.  Skin prepped and draped in usual sterile fashion.  Skin and soft tissues anesthetized with 10 mL of 1% lidocaine.  5 [place] Yueh catheter placed into peritoneal cavity.  2000 mL of yellow fluid aspirated by vacuum bottle suction.  Procedure tolerated well by patient without immediate complication.  COMPLICATIONS: None  FINDINGS: A total of approximately 2000 mL of ascitic fluid was removed. A fluid sample of 180 mL was sent for laboratory analysis.  IMPRESSION: Successful ultrasound guided paracentesis yielding 2000 mL of ascites.   Electronically Signed   By: Lavonia Dana M.D.   On: 05/05/2015 14:45    Microbiology: No results found for this or any previous visit (from the past 240  hour(s)).   Labs: Basic Metabolic Panel: No results for input(s): NA, K, CL, CO2, GLUCOSE, BUN, CREATININE, CALCIUM, MG, PHOS in the last 168 hours. Liver Function Tests: No results for input(s): AST, ALT, ALKPHOS, BILITOT, PROT, ALBUMIN in the last 168 hours. No results for input(s): LIPASE, AMYLASE in the last 168 hours. No results for input(s): AMMONIA in the last 168 hours. CBC: No results for input(s): WBC, NEUTROABS, HGB, HCT, MCV, PLT in the last 168 hours. Cardiac Enzymes: No results for input(s): CKTOTAL, CKMB, CKMBINDEX, TROPONINI in the last 168 hours. BNP: BNP (last 3 results)  Recent Labs  03/16/15 2030 04/14/15 1607  BNP 68.0 52.0    ProBNP (last 3 results) No results for input(s): PROBNP in the last 8760 hours.  CBG:  No results for input(s): GLUCAP in the last 168 hours.     SignedNita Sells  [facility] 05/17/2015, 9:01 AM

## 2015-05-18 ENCOUNTER — Encounter: Payer: Self-pay | Admitting: Gastroenterology

## 2015-05-18 ENCOUNTER — Telehealth: Payer: Self-pay | Admitting: Gastroenterology

## 2015-05-18 ENCOUNTER — Other Ambulatory Visit (HOSPITAL_COMMUNITY): Payer: Self-pay | Admitting: Oncology

## 2015-05-18 DIAGNOSIS — R188 Other ascites: Secondary | ICD-10-CM

## 2015-05-18 NOTE — Telephone Encounter (Signed)
Patient had an EGD at [facility] in Dec 2015 I believe. I can see he had the encounter, but I can't find the operative notes. Are you able to find?

## 2015-05-18 NOTE — Assessment & Plan Note (Signed)
54 year old male, chronically-ill appearing, presenting with RUQ and 2 paracentesis since June 2016. Notable history of pancreatic cancer s/p whipple procedure in 2008. Most recently diagnosed with DVT/PE, on Lovenox. Unknown chronic liver disease but does have a history of heavy ETOH use. Followed closely by oncology. Needs elastography to evaluate for fibrosis/cirrhosis. Keep close follow-up with Oncology. Increase aldactone to 50 mg daily, add lasix 20 mg daily. Check BMP in 1 week. Need last EGD report from [facility] around Dec 2015. Heme positive stool noted but no overt signs of GI bleeding, and last colonoscopy in 2014. With weight loss, anemia, may need repeat early interval colonoscopy. Further recommendations after elastography.

## 2015-05-19 ENCOUNTER — Ambulatory Visit (HOSPITAL_COMMUNITY)
Admission: RE | Admit: 2015-05-19 | Discharge: 2015-05-19 | Disposition: A | Discharge status: Home / Self Care | Payer: Medicare Other | Source: Ambulatory Visit | Attending: Oncology | Admitting: Oncology

## 2015-05-19 ENCOUNTER — Other Ambulatory Visit (HOSPITAL_COMMUNITY): Payer: Self-pay | Admitting: Oncology

## 2015-05-19 ENCOUNTER — Encounter (HOSPITAL_BASED_OUTPATIENT_CLINIC_OR_DEPARTMENT_OTHER): Payer: Medicare Other

## 2015-05-19 DIAGNOSIS — Z85118 Personal history of other malignant neoplasm of bronchus and lung: Secondary | ICD-10-CM | POA: Diagnosis not present

## 2015-05-19 DIAGNOSIS — R188 Other ascites: Secondary | ICD-10-CM

## 2015-05-19 DIAGNOSIS — D649 Anemia, unspecified: Secondary | ICD-10-CM | POA: Diagnosis not present

## 2015-05-19 DIAGNOSIS — C3490 Malignant neoplasm of unspecified part of unspecified bronchus or lung: Secondary | ICD-10-CM | POA: Diagnosis not present

## 2015-05-19 DIAGNOSIS — I2699 Other pulmonary embolism without acute cor pulmonale: Secondary | ICD-10-CM | POA: Diagnosis not present

## 2015-05-19 DIAGNOSIS — Z87891 Personal history of nicotine dependence: Secondary | ICD-10-CM | POA: Diagnosis not present

## 2015-05-19 DIAGNOSIS — C259 Malignant neoplasm of pancreas, unspecified: Secondary | ICD-10-CM | POA: Diagnosis not present

## 2015-05-19 DIAGNOSIS — Z8507 Personal history of malignant neoplasm of pancreas: Secondary | ICD-10-CM | POA: Diagnosis not present

## 2015-05-19 DIAGNOSIS — I82403 Acute embolism and thrombosis of unspecified deep veins of lower extremity, bilateral: Secondary | ICD-10-CM | POA: Diagnosis not present

## 2015-05-19 LAB — CBC WITH DIFFERENTIAL/PLATELET
BASOS ABS: 0.1 10*3/uL (ref 0.0–0.1)
BASOS PCT: 1 % (ref 0–1)
Eosinophils Absolute: 0.2 10*3/uL (ref 0.0–0.7)
Eosinophils Relative: 2 % (ref 0–5)
HCT: 31.7 % — ABNORMAL LOW (ref 39.0–52.0)
HEMOGLOBIN: 11.2 g/dL — AB (ref 13.0–17.0)
Lymphocytes Relative: 24 % (ref 12–46)
Lymphs Abs: 2.6 10*3/uL (ref 0.7–4.0)
MCH: 34.9 pg — ABNORMAL HIGH (ref 26.0–34.0)
MCHC: 35.3 g/dL (ref 30.0–36.0)
MCV: 98.8 fL (ref 78.0–100.0)
Monocytes Absolute: 0.9 10*3/uL (ref 0.1–1.0)
Monocytes Relative: 8 % (ref 3–12)
NEUTROS PCT: 65 % (ref 43–77)
Neutro Abs: 7.1 10*3/uL (ref 1.7–7.7)
Platelets: 287 10*3/uL (ref 150–400)
RBC: 3.21 MIL/uL — ABNORMAL LOW (ref 4.22–5.81)
RDW: 15.9 % — ABNORMAL HIGH (ref 11.5–15.5)
WBC: 10.8 10*3/uL — ABNORMAL HIGH (ref 4.0–10.5)

## 2015-05-19 NOTE — Telephone Encounter (Signed)
Patrick Lee had printed it from the [employer] site and it is on your desk for review.

## 2015-05-19 NOTE — Progress Notes (Signed)
cc'ed to pcp °

## 2015-05-19 NOTE — Progress Notes (Signed)
LABS DRAWN

## 2015-05-20 LAB — BASIC METABOLIC PANEL
BUN: 3 mg/dL — AB (ref 6–23)
CALCIUM: 7.7 mg/dL — AB (ref 8.4–10.5)
CHLORIDE: 105 meq/L (ref 96–112)
CO2: 20 mEq/L (ref 19–32)
CREATININE: 0.53 mg/dL (ref 0.50–1.35)
GLUCOSE: 104 mg/dL — AB (ref 70–99)
Potassium: 4.9 mEq/L (ref 3.5–5.3)
Sodium: 134 mEq/L — ABNORMAL LOW (ref 135–145)

## 2015-05-23 ENCOUNTER — Telehealth (HOSPITAL_COMMUNITY): Payer: Self-pay

## 2015-05-23 ENCOUNTER — Other Ambulatory Visit: Payer: Self-pay

## 2015-05-23 DIAGNOSIS — R1011 Right upper quadrant pain: Secondary | ICD-10-CM

## 2015-05-23 NOTE — Telephone Encounter (Signed)
A user error has taken place: encounter opened in error, closed for administrative reasons.

## 2015-05-24 ENCOUNTER — Encounter: Payer: Self-pay | Admitting: Gastroenterology

## 2015-05-24 NOTE — Progress Notes (Signed)
EGD from [facility] Dec 2015: normal esophagus, patent afferent and efferent libs, no abnormalities in jejunum.

## 2015-05-25 ENCOUNTER — Encounter (HOSPITAL_COMMUNITY): Payer: Self-pay | Admitting: Hematology & Oncology

## 2015-05-25 ENCOUNTER — Encounter (HOSPITAL_BASED_OUTPATIENT_CLINIC_OR_DEPARTMENT_OTHER): Payer: Medicare Other | Admitting: Hematology & Oncology

## 2015-05-25 ENCOUNTER — Other Ambulatory Visit (HOSPITAL_COMMUNITY)
Admission: RE | Admit: 2015-05-25 | Discharge: 2015-05-25 | Disposition: A | Discharge status: Home / Self Care | Payer: Medicare Other | Source: Ambulatory Visit | Attending: Hematology & Oncology | Admitting: Hematology & Oncology

## 2015-05-25 VITALS — BP 94/65 | HR 91 | Temp 97.7°F | Resp 16 | Wt 136.6 lb

## 2015-05-25 DIAGNOSIS — D6489 Other specified anemias: Secondary | ICD-10-CM

## 2015-05-25 DIAGNOSIS — D72829 Elevated white blood cell count, unspecified: Secondary | ICD-10-CM | POA: Diagnosis present

## 2015-05-25 DIAGNOSIS — D649 Anemia, unspecified: Secondary | ICD-10-CM | POA: Diagnosis not present

## 2015-05-25 HISTORY — PX: BONE MARROW BIOPSY: SHX199

## 2015-05-25 HISTORY — PX: BONE MARROW ASPIRATION: SHX1252

## 2015-05-25 LAB — CBC WITH DIFFERENTIAL/PLATELET
Basophils Absolute: 0.2 10*3/uL — ABNORMAL HIGH (ref 0.0–0.1)
Basophils Relative: 1 % (ref 0–1)
EOS PCT: 1 % (ref 0–5)
Eosinophils Absolute: 0.1 10*3/uL (ref 0.0–0.7)
HCT: 30.9 % — ABNORMAL LOW (ref 39.0–52.0)
Hemoglobin: 10.9 g/dL — ABNORMAL LOW (ref 13.0–17.0)
Lymphocytes Relative: 33 % (ref 12–46)
Lymphs Abs: 4.2 10*3/uL — ABNORMAL HIGH (ref 0.7–4.0)
MCH: 35.3 pg — ABNORMAL HIGH (ref 26.0–34.0)
MCHC: 35.3 g/dL (ref 30.0–36.0)
MCV: 100 fL (ref 78.0–100.0)
MONO ABS: 0.7 10*3/uL (ref 0.1–1.0)
MONOS PCT: 6 % (ref 3–12)
NEUTROS PCT: 59 % (ref 43–77)
Neutro Abs: 7.6 10*3/uL (ref 1.7–7.7)
Platelets: 366 10*3/uL (ref 150–400)
RBC: 3.09 MIL/uL — ABNORMAL LOW (ref 4.22–5.81)
RDW: 15.8 % — ABNORMAL HIGH (ref 11.5–15.5)
WBC: 12.7 10*3/uL — AB (ref 4.0–10.5)

## 2015-05-25 LAB — BONE MARROW EXAM

## 2015-05-25 MED ORDER — OXYCODONE HCL 10 MG PO TABS: 10.0000 mg | ORAL_TABLET | ORAL | Status: DC

## 2015-05-25 NOTE — Progress Notes (Signed)
Patrick Lee presented for labwork. Labs per MD order drawn via Peripheral Line 23 gauge needle inserted in left AC  Good blood return present. Procedure without incident.  Needle removed intact. Patient tolerated procedure well. [place] PROGRESS NOTE  Patrick Lee presents for Bone Marrow biopsy per MD orders. Patrick Lee verbalized understanding of procedure. Consent reviewed and signed.  Patrick Lee positioned supine for procedure. Time-out performed and Bone Marrow Checklist. Procedure began at [phone]. Xylocaine 1% 13cc used for local and administered to patient by Dr. Whitney Muse. Procedure completed at 0908. Patient tolerated well. Pressure dressing applied to the left hip with instructions to leave in place for 24 hours. Patient instructed to report any bleeding that saturates dressing and to take pain medication oxycodone as directed. Dressing dry and intact to the left hip on discharge.

## 2015-05-25 NOTE — Progress Notes (Signed)
[facility] Hematology/Oncology Consultation   Name: Patrick Lee      MRN: [id number]   Date: 06/18/2015 Time:8:22 PM   REFERRING PHYSICIAN:  Wendie Simmer  REASON FOR CONSULT:  Anemia in the setting of anticoagulation for VTE.   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   The patient's story is that he has recovered nicely from his Whipple Procedure (only about 5% of patient with pancreatic cancer, all-comers, survive 5 years) that was surgically resected by Dr. Jyl Heinz at [facility] and subsequent wedge resection for adenocarcinoma of lung in June 2013 until June 2015 when he started to notice a decline in his performance status, appetite, and weight secondary to nausea with vomiting.  Of note, prior to September 2015, his Hgb was WNL.  In June 2015, he weight 178 lbs and he now is down to 131.5 lbs.  In May 2016, he presented to the ED with shortness of breath at which time he was found to have a PE and B/L LE DVTs.  He denies any recent long travel or surgeries.  He denies any significant risk factors for VTE except a history of malignancy, history of tobacco abuse, and sedentary lifestyle.  He was started on Lovenox anticoagulation and then re-presented to [facility] with unexplained ascites.  2.4 L of clear yellow fluid was drained from abdomen.  He notes that his appetite increased and his breathing improved subsequently.  He was  referred to hematology for VTE and anemia evaluation, but his case is much more complicated than that.  He admits to drenching night sweats requiring him to change clothes.  His weight loss is very significant and his unexplained ascites is worrisome, in addition to his unprovoked VTE and severe anemia.    On review, he was started on iron tablets, but he is demonstrating intolerance to oral iron.  I have asked him to stop this medication.  I do not see any record of ferritin level.  I do see iron/TIBC studies by his primary care provider demonstrating an anemia of chronic disease-like picture without frank evidence of iron deficiency.  He does have an EtOH and tobacco abuse history.  Details can be found in his social history.   Additionally, I have reviewed his surgical oncology notes from [facility] and the patient is noted to have an elevated CA 19-9 in December 2015.  He notes that since his first visit he has been getting around better and his appetite has increased. His family member mentioned he eats about 4-6 times a day now. He notes however that his weight may be up several pounds because his leg swelling is worse.  On evaluation of his anemia he was noted to have onc positive hemoccult. He is following with GI. He has an elevated CRP, findings suggestive of liver disease (elevated bilirubin, mild elevation in AST), new onset ascites, intermitten leukocytosis, polyclonal gammopathy, immunofixation showing an IgG monoclonal protein, normal TSH,  liver albumen which may be consistent with liver disease or malnutrition. He has an inappropriate reticulocyte response.  He presents today for bone marrow biopsy and aspirate for additional evaluation of his anemia.   PAST MEDICAL HISTORY:   Past Medical History  Diagnosis Date  . Lateral epicondylitis   . Chest pain, unspecified   . Abdominal pain, other specified site   . Obstruction of bile duct   . Tobacco use disorder   . Anxiety   .  Shortness of breath     uses oxygen at night- 2 liters   . Myocardial infarction 2007    during [facility]- 2007  . Complication of anesthesia     pt. reports that he had MI while under anesth. for Whipple procedure  . Pancreatic cancer      status post Whipple procedure.  . Lung cancer     ALLERGIES: No Known Allergies    MEDICATIONS: I have reviewed the patient's current medications.    Current Outpatient Prescriptions on File Prior to Visit  Medication Sig Dispense Refill  . acetaminophen (TYLENOL) 500 MG tablet Take 500 mg by mouth every 6 (six) hours as needed for mild pain or moderate pain.    Marland Kitchen enoxaparin (LOVENOX) 100 MG/ML injection Inject 1 mL (100 mg total) into the skin daily. 30 Syringe 0  . feeding supplement, ENSURE ENLIVE, (ENSURE ENLIVE) LIQD Take 237 mLs by mouth 2 (two) times daily between meals. 237 mL 12  . furosemide (LASIX) 20 MG tablet Take 1 tablet (20 mg total) by mouth daily. With aldactone in the morning. 30 tablet 3  . ondansetron (ZOFRAN) 4 MG tablet Take 1 tablet (4 mg total) by mouth every 8 (eight) hours as needed for nausea or vomiting. 30 tablet 0  . spironolactone (ALDACTONE) 25 MG tablet Take 2 tablets (50 mg total) by mouth daily. With lasix 60 tablet 3  . ferrous sulfate 325 (65 FE) MG tablet Take 325 mg by mouth 2 (two) times daily.    . OXYGEN Inhale 2 L into the lungs as needed.      No current facility-administered medications on file prior to visit.     PAST SURGICAL HISTORY Past Surgical History  Procedure Laterality Date  . Whipple procedure  2007  . [facility]ercut[facility] gallstones      [facility]  . Knee arthroscopy      R knee- at [facility]  . Cholecystectomy      open  . Video bronchoscopy  04/08/2012    Procedure: VIDEO BRONCHOSCOPY;  Surgeon: Gaye Pollack, MD;  Location: [facility] OR;  Service: Thoracic;  Laterality: N/A;  . Partial right lung removal    . Colonoscopy  May 2014    Dr. Anthony Sar:  normal   . Esophagogastroduodenoscopy  Dec 2015    [facility]. normal esophagus, patent afferent and efferent libs, no abnormalities in jejunum.   . Bone marrow biopsy Left 05/25/15  . Bone marrow aspiration Left 05/25/15    FAMILY HISTORY: Family History  Problem Relation Age of Onset  . Stroke Father 15    Deceased  . Anesthesia problems Neg Hx   . Colon cancer Neg Hx   . Cirrhosis Neg Hx    Patient has 4 children, 3 sons (ages 18, 51, and 32) who are healthy and 1 daughter who is 95 years old (healthy).  His mother is 79 years old and she is healthy and still working.  His father  is 19 and recently had a stroke.  He is divorced x 2.  SOCIAL HISTORY: He notes that he quit drinking EtOH about 1 year ago, but used to drink 1 case of beer weekly for many years, more when he was younger.  He also has a 40 pack year smoking history quitting recently.  He has a distant history of dip.  He denies any illicit drug abuse.  He is currently disabled from his pancreatic cancer treatment, he reports, and used to work at [employer] as a Merchant navy officer.  PERFORMANCE STATUS: The patient's performance status is 2 - Symptomatic, <50% confined to bed  ROS Positive for edema. In ankles and abdomen. Complains of leg swelling and abdominal swelling Weakness 14 point review of systems was performed and is negative except as detailed under history of present illness and above   PHYSICAL EXAM: Most Recent Vital Signs: Blood pressure 94/65, pulse 91, temperature 97.7 F (36.5 C), temperature source Oral, resp. rate 16, weight 136 lb 9.6 oz (61.961 kg), SpO2 100 %. General appearance: alert, cooperative, appears older than stated age and mild distress Head: Normocephalic, without obvious abnormality, atraumatic Eyes: negative findings: lids and lashes normal, conjunctivae and sclerae normal, corneas clear and pupils equal, round, reactive to light and accomodation, positive findings: small  pupils Throat: normal findings: lips normal without lesions, buccal mucosa normal, palate normal, tongue midline and normal and soft palate, uvula, and tonsils normal Neck: no adenopathy, supple, symmetrical, trachea midline and thyroid not enlarged, symmetric, no tenderness/mass/nodules Lungs: clear to auscultation bilaterally and normal percussion bilaterally Heart: regular rate and rhythm, S1, S2 normal, no murmur, click, rub or gallop Abdomen:no palpable masses, thin with slight distension  moderate tenderness in the entire abdomen Extremities: edema B/L 2-3+ pitting pretibially. Skin: Skin color, texture, turgor normal. No rashes or lesions Lymph nodes: Cervical, supraclavicular, and axillary nodes normal. Neurologic: Grossly normal General: Ambulates with assistance of a cane.   LABORATORY DATA:  I have reviewed the laboratory studies listed below  Results for JMARI, PELC (MRN [id number])   Ref. Range 05/25/2015 08:20  WBC Latest Ref Range: 4.0-10.5 K/uL 12.7 (H)  RBC Latest Ref Range: 4.22-5.81 MIL/uL 3.09 (L)  Hemoglobin Latest Ref Range: 13.0-17.0 g/dL 10.9 (L)  HCT Latest Ref Range: 39.0-52.0 % 30.9 (L)  MCV Latest Ref Range: 78.0-100.0 fL 100.0  MCH Latest Ref Range: 26.0-34.0 pg 35.3 (H)  MCHC Latest Ref Range: 30.0-36.0 g/dL 35.3  RDW Latest Ref Range: 11.5-15.5 % 15.8 (H)  Platelets Latest Ref Range: 150-400 K/uL 366  Neutrophils Latest Ref Range: 43-77 % 59  Lymphocytes Latest Ref Range: 12-46 % 33  Monocytes Relative Latest Ref Range: 3-12 % 6  Eosinophil Latest Ref Range: 0-5 % 1  Basophil Latest Ref Range: 0-1 % 1  NEUT# Latest Ref Range: 1.7-7.7 K/uL 7.6  Lymphocyte # Latest Ref Range: 0.7-4.0 K/uL 4.2 (H)  Monocyte # Latest Ref Range: 0.1-1.0 K/uL 0.7  Eosinophils Absolute Latest Ref Range: 0.0-0.7 K/uL 0.1  Basophils Absolute Latest Ref Range: 0.0-0.1 K/uL 0.2 (H)   Bone Marrow Biopsy and Aspiration Procedure Note   Informed consent was obtained and  potential risks including bleeding, infection and pain were reviewed with the patient.   The patient's name, date of birth, identification, consent and allergies were verified prior to the start of procedure and time out was performed.  The left posterior iliac crest was chosen as the site of biopsy.  The skin was prepped with Betadine solution.  8 cc of 1% lidocaine was used to provide local anaesthesia.   10 cc of bone marrow aspirate was obtained followed by 1 inch biopsy.  Pressure was applied to the biopsy site and bandage was placed over the biopsy site. Patient was made to lie on back for 30 mins prior to discharge.  The procedure was tolerated well. COMPLICATIONS: None BLOOD LOSS: none The patient was discharged home in stable condition with a 1 week follow up to review results. He was provided with post bone marrow biopsy instructions and instructed to call if there was any bleeding or worsening pain.  Specimens sent for flow cytometry, cytogenetics and additional studies.   RADIOGRAPHY: CLINICAL DATA: Subsequent treatment strategy for pancreatic and lung cancer ; weight loss, and night sweats.  EXAM: NUCLEAR MEDICINE PET SKULL BASE TO THIGH  TECHNIQUE: 6.5 mCi F-18 FDG was injected intravenously. Full-ring PET imaging was performed from the skull base to thigh after the radiotracer. CT data was obtained and used for attenuation correction and anatomic localization.  FASTING BLOOD GLUCOSE: Value: 114 mg/dl  COMPARISON: Multiple exams, including 04/14/2015 and 01/23/2012  FINDINGS: NECK  No hypermetabolic lymph nodes in the neck.  CHEST  Increase in lingular nodular atelectasis/scarring compared to the exam from 19 days ago, with faint hypermetabolic uptake in this vicinity, maximum standard uptake value 2.1. Moderate right pleural effusion. Nodularity peripherally in both lower lobes, on the right side with maximum standard uptake value 1.4 and  on the left with maximum standard uptake value 1.5, likely from atelectasis/inflammation.  Coronary artery atherosclerosis. Mild paraseptal emphysema.  ABDOMEN/PELVIS  Prior Whipple. Extensive ascites. There is some physiologic areas of high bowel activity. In the right abdomen just above the iliac crests, there is high activity not corresponding to a visible mass or loop of bowel, maximum standard uptake value 11.0. It is possible that this represents physiologic activity in a loop of bowel which moved between the PET and CT and position. Again, I do not observe a definite soft tissue mass in this vicinity along the ascites.  Aortoiliac atherosclerotic vascular disease. Prior Whipple.  SKELETON  No focal hypermetabolic activity to suggest skeletal metastasis.  IMPRESSION: 1. Extensive ascites. No compelling findings of peritoneal carcinomatosis despite this ascites. Reason focus of high activity just above the iliac crest to the right abdomen which does not have a definite CT correlate, and which I suspect probably represents a loop of bowel which moved between the PET images and the CT images. 2. Bibasilar atelectasis/scarring, standard uptake value below thresholds for malignancy suspicion. 3. Coronary artery atherosclerosis. 4. Mild paraseptal emphysema.   Electronically Signed  By: Van Clines M.D.  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 His medical history is quite complicated including a history of pancreatic cancer, history of lung cancer, weight loss with a degree of malnutrition, new onset ascites and currently under evaluation for liver disease given chronic alcohol use in the past. He  has a significant anemia which I suspect is multifactorial and BMBX has been performed to complete evaluation.  A total of more than 40 minutes was spent in direct patient consultation and advisement and doing his bone marrow biopsy. Patient tolerated the procedure without any complications. I will plan on bringing him back within 5-7 business days to review the results of his bone marrow.  He was advised to call with any problems at the bone marrow site such as bleeding, bruising, redness or pain.  All questions were answered. The patient knows to call the clinic with any problems, questions or concerns. We can certainly see the patient much sooner if necessary.   This document serves as a record of services personally performed by Ancil Linsey, MD. It was created on her behalf by Janace Hoard, a trained medical scribe. The creation of this record is based on the scribe's personal observations and the provider's statements to them. This document has been checked and approved by the attending provider.  I have reviewed the above documentation for accuracy and completeness, and I agree with the above.  This note is electronically signed.  Kelby Fam. Whitney Muse, MD

## 2015-05-25 NOTE — Patient Instructions (Signed)
[facility] Discharge Instructions for Post Bone Marrow Procedure  Today you had a bone marrow biopsy and aspirate of the left hip   Please keep the pressure dressing in place for at least 24 hours.  Have someone check your dressing periodically for bleeding.  If needed you can reapply a pressure dressing to the site.  Take pain medication Oxycodone as directed.  IF BLEEDING REOCCURS THAT SHOULD BE REPORTED IMMEDIATELY. Call the [facility] at [phone] if during business hours. Or report to the Emergency Room.      Bone Marrow Aspiration, Bone Marrow Biopsy Care After Read the instructions outlined below and refer to this sheet in the next few weeks. These discharge instructions provide you with general information on caring for yourself after you leave the hospital. Your caregiver may also give you specific instructions. While your treatment has been planned according to the most current medical practices available, unavoidable complications occasionally occur. If you have any problems or questions after discharge, call your caregiver. FINDING OUT THE RESULTS OF YOUR TEST Not all test results are available during your visit. If your test results are not back during the visit, make an appointment with your caregiver to find out the results. Do not assume everything is normal if you have not heard from your caregiver or the medical facility. It is important for you to follow up on all of your test results.  HOME CARE INSTRUCTIONS   Keep your dressing clean and dry. You may replace dressing with a bandage after 24 hours.  You may take a bath or shower after 24 hours.  Use an ice pack for 20 minutes every 2 hours while awake for pain as needed. SEEK MEDICAL CARE IF:   There is redness, swelling, or increasing pain at the biopsy site.  There is pus coming from the biopsy site.  There is drainage from a biopsy site lasting longer than one day.  An unexplained  oral temperature above 102 F (38.9 C) develops. SEEK IMMEDIATE MEDICAL CARE IF:   You develop a rash.  You have difficulty breathing.  You develop any reaction or side effects to medications given. Document Released: 05/10/2005 Document Revised: 01/13/2012 Document Reviewed: 10/18/2008 [facility] Patient Information 2015 [place], [place]. This information is not intended to replace advice given to you by your health care provider. Make sure you discuss any questions you have with your health care provider.

## 2015-05-26 ENCOUNTER — Ambulatory Visit (HOSPITAL_COMMUNITY)
Admission: RE | Admit: 2015-05-26 | Discharge: 2015-05-26 | Disposition: A | Discharge status: Home / Self Care | Payer: Medicare Other | Source: Ambulatory Visit | Attending: Gastroenterology | Admitting: Gastroenterology

## 2015-05-26 DIAGNOSIS — K769 Liver disease, unspecified: Secondary | ICD-10-CM | POA: Insufficient documentation

## 2015-05-26 DIAGNOSIS — Z8507 Personal history of malignant neoplasm of pancreas: Secondary | ICD-10-CM | POA: Diagnosis not present

## 2015-05-26 DIAGNOSIS — Z85118 Personal history of other malignant neoplasm of bronchus and lung: Secondary | ICD-10-CM | POA: Insufficient documentation

## 2015-05-26 DIAGNOSIS — Z9049 Acquired absence of other specified parts of digestive tract: Secondary | ICD-10-CM | POA: Insufficient documentation

## 2015-05-26 DIAGNOSIS — R1011 Right upper quadrant pain: Secondary | ICD-10-CM

## 2015-05-26 DIAGNOSIS — Z903 Acquired absence of stomach [part of]: Secondary | ICD-10-CM | POA: Diagnosis not present

## 2015-05-26 DIAGNOSIS — Z720 Tobacco use: Secondary | ICD-10-CM | POA: Diagnosis not present

## 2015-05-29 ENCOUNTER — Telehealth (HOSPITAL_COMMUNITY): Payer: Self-pay

## 2015-05-29 NOTE — Telephone Encounter (Signed)
Patient states "when I woke up this morning the bandages over the place Dr. Whitney Muse did the biopsy was wet with clear fluid and had a good sized wet spot on my bed.  Think it's where I get fluid and it's draining from there."  Denies any fevers, redness, or swelling at the site. Instructed to call office if drainage changes - yellowish or greenish drainage, swelling at the site, fevers, etc.  Verbalized understanding of instructions.

## 2015-05-30 ENCOUNTER — Telehealth: Payer: Self-pay

## 2015-05-30 NOTE — Telephone Encounter (Signed)
Pt is calling to see about the [place] report.

## 2015-05-30 NOTE — Telephone Encounter (Signed)
Please let him know there are changes of early cirrhosis likely.  I have sent a note to [place]. He needs further lab work. Need OV with me in next 2-3 weeks if not already set up.

## 2015-05-30 NOTE — Progress Notes (Signed)
Quick Note:  Elastography reviewed. He has fibrosis/early cirrhotic changes. Metavir score F3/F4. This would explain ascites. Hep C negative in 2014.  Is he drinking alcohol at all? Needs to avoid indefinitely.  We need to check further serologies to assess culprit for cirrhosis. Let's check AMA, ANA, ASMA, immunoglobulins, ceruloplasmin. ______

## 2015-06-01 ENCOUNTER — Other Ambulatory Visit: Payer: Self-pay

## 2015-06-01 DIAGNOSIS — R748 Abnormal levels of other serum enzymes: Secondary | ICD-10-CM

## 2015-06-01 NOTE — Progress Notes (Signed)
Quick Note:  LMOM for a return call. ______ 

## 2015-06-01 NOTE — Progress Notes (Signed)
Quick Note:  Lab orders faxed to [facility]. ______

## 2015-06-01 NOTE — Progress Notes (Signed)
Quick Note:  Pt is aware of results. He said he HAS NOT DRANK ALCOHOL FOR SEVERAL MONTHS NOW. He will try to go to [facility] and do the labs tomorrow or Monday. ______

## 2015-06-02 LAB — IGG, IGA, IGM
IGG (IMMUNOGLOBIN G), SERUM: 1130 mg/dL (ref 650–1600)
IgA: 425 mg/dL — ABNORMAL HIGH (ref 68–379)
IgM, Serum: 84 mg/dL (ref 41–251)

## 2015-06-02 LAB — ANTI-SMOOTH MUSCLE ANTIBODY, IGG: SMOOTH MUSCLE AB: 7 U (ref <20)

## 2015-06-02 LAB — ANA: ANA: NEGATIVE

## 2015-06-05 LAB — MITOCHONDRIAL ANTIBODIES: Mitochondrial M2 Ab, IgG: 0.25 (ref <0.91)

## 2015-06-05 LAB — CERULOPLASMIN: Ceruloplasmin: 14 mg/dL — ABNORMAL LOW (ref 18–36)

## 2015-06-06 NOTE — Telephone Encounter (Signed)
May be 3-4 weeks out if needed. Ok to use an urgent.

## 2015-06-06 NOTE — Telephone Encounter (Signed)
We dont have any appointment with in two weeks please advise

## 2015-06-06 NOTE — Telephone Encounter (Signed)
Office visit on 06/20/15 @ 11:30 am with AS

## 2015-06-08 ENCOUNTER — Encounter (HOSPITAL_COMMUNITY): Payer: Medicare Other | Attending: Hematology & Oncology | Admitting: Hematology & Oncology

## 2015-06-08 VITALS — BP 90/76 | HR 116 | Temp 97.9°F | Resp 18 | Wt 137.9 lb

## 2015-06-08 DIAGNOSIS — Z8507 Personal history of malignant neoplasm of pancreas: Secondary | ICD-10-CM | POA: Diagnosis not present

## 2015-06-08 DIAGNOSIS — Z7901 Long term (current) use of anticoagulants: Secondary | ICD-10-CM | POA: Diagnosis not present

## 2015-06-08 DIAGNOSIS — E440 Moderate protein-calorie malnutrition: Secondary | ICD-10-CM | POA: Diagnosis not present

## 2015-06-08 DIAGNOSIS — Z87891 Personal history of nicotine dependence: Secondary | ICD-10-CM | POA: Insufficient documentation

## 2015-06-08 DIAGNOSIS — D649 Anemia, unspecified: Secondary | ICD-10-CM | POA: Insufficient documentation

## 2015-06-08 DIAGNOSIS — Z85118 Personal history of other malignant neoplasm of bronchus and lung: Secondary | ICD-10-CM | POA: Insufficient documentation

## 2015-06-08 DIAGNOSIS — I82403 Acute embolism and thrombosis of unspecified deep veins of lower extremity, bilateral: Secondary | ICD-10-CM | POA: Insufficient documentation

## 2015-06-08 DIAGNOSIS — I2699 Other pulmonary embolism without acute cor pulmonale: Secondary | ICD-10-CM | POA: Diagnosis not present

## 2015-06-08 DIAGNOSIS — R188 Other ascites: Secondary | ICD-10-CM | POA: Insufficient documentation

## 2015-06-08 LAB — CHROMOSOME ANALYSIS, BONE MARROW

## 2015-06-08 MED ORDER — OXYCODONE HCL 10 MG PO TABS: 10.0000 mg | ORAL_TABLET | ORAL | Status: DC

## 2015-06-08 NOTE — Patient Instructions (Signed)
[place] at [facility] Discharge Instructions  RECOMMENDATIONS MADE BY THE CONSULTANT AND ANY TEST RESULTS WILL BE SENT TO YOUR REFERRING PHYSICIAN.  Exam and discussion by Dr. Whitney Muse. Your blood work has improved Do not drink alcohol to help preserve your liver. Oxycodone - take as directed.  Office visit and labs in 2 months.  Thank you for choosing [place] at [facility] to provide your oncology and hematology care.  To afford each patient quality time with our provider, please arrive at least 15 minutes before your scheduled appointment time.    You need to re-schedule your appointment should you arrive 10 or more minutes late.  We strive to give you quality time with our providers, and arriving late affects you and other patients whose appointments are after yours.  Also, if you no show three or more times for appointments you may be dismissed from the clinic at the providers discretion.     Again, thank you for choosing [facility].  Our hope is that these requests will decrease the amount of time that you wait before being seen by our physicians.       _____________________________________________________________  Should you have questions after your visit to [facility], please contact our office at [phone] between the hours of 8:30 a.m. and 4:30 p.m.  Voicemails left after 4:30 p.m. will not be returned until the following business day.  For prescription refill requests, have your pharmacy contact our office.

## 2015-06-08 NOTE — Progress Notes (Signed)
[facility] Hematology/Oncology Consultation   Name: Patrick Lee      MRN: [id number]   Date: 07/15/2015 Time:3:39 PM   REFERRING PHYSICIAN:  Wendie Simmer  REASON FOR CONSULT:  Anemia in the setting of anticoagulation for VTE.   DIAGNOSIS:  Macrocytic, normochromic anemia with normal WBC and platelet count in the setting of anticoagulation with Lovenox for VTE History of pancreatic Cancer Weight Loss Cirrhosis Alcohol use  HISTORY OF PRESENT ILLNESS:   [name] is a 54 year old white American man with a past medical history significant for pancreatic cancer s/p whipple procedure in 2008 by Dr. 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   Patient has attended with son. The patient denies drinking alcohol, but tests have indicated evidence of cirrhosis. He says he's no longer drinking, that he doesn't plan on drinking anymore, and that it's been four months since he last drank. He says he used to drink a lot when he was younger. He reports that he "isn't the type that needs it." He was in the hospital around Father's Day; says he hasn't had any alcohol to drink since before then.  Reports that his appetite isn't the same as it was, but that it's gotten better.  He is feeling better overall.  He is here today to review his BMBX.  He says he's experiencing pain in his liver area and abdomen. He takes two pain pills daily, which has helped dull the pain. He reports being less "ill" (indicating irritability) in terms of his mood while using the pain medicine.  He stands with the help of a cane, but ambulatory and walked across room without help of the cane.  He reports feeling "bloated" in the abdomen. Area from bone marrow sample has been draining clear liquid; says it acted like a "pop-off valve" whenever his stomach  became very bloated. He notes that the area is now completely healed and he has no pain.   PAST MEDICAL HISTORY:   Past Medical History  Diagnosis Date  . Lateral epicondylitis   . Chest pain, unspecified   . Abdominal pain, other specified site   . Obstruction of bile duct   . Tobacco use disorder   . Anxiety   . Shortness of breath     uses oxygen at night- 2 liters   . Myocardial infarction 2007    during [place]- 2007  . Complication of anesthesia     pt. reports that he had MI while under anesth. for Whipple procedure  . Pancreatic cancer      status post Whipple procedure.  . Lung cancer     ALLERGIES: No Known Allergies    MEDICATIONS: I have reviewed the patient's current medications.    Current Outpatient Prescriptions on File Prior to Visit  Medication Sig Dispense Refill  . acetaminophen (TYLENOL) 500 MG tablet Take 500 mg by mouth every 6 (six) hours as needed for mild pain or moderate pain.    Marland Kitchen enoxaparin (LOVENOX) 100 MG/ML injection Inject 1 mL (100 mg total) into the skin daily. 30 Syringe 0  . feeding supplement, ENSURE ENLIVE, (ENSURE ENLIVE) LIQD Take 237 mLs by mouth 2 (two) times daily between meals. 237 mL 12  . furosemide (LASIX) 20 MG tablet Take 1 tablet (20 mg  total) by mouth daily. With aldactone in the morning. 30 tablet 3  . ondansetron (ZOFRAN) 4 MG tablet Take 1 tablet (4 mg total) by mouth every 8 (eight) hours as needed for nausea or vomiting. 30 tablet 0  . spironolactone (ALDACTONE) 25 MG tablet Take 2 tablets (50 mg total) by mouth daily. With lasix 60 tablet 3   No current facility-administered medications on file prior to visit.     PAST SURGICAL HISTORY Past Surgical History  Procedure Laterality Date  . Whipple procedure  2007  . Percutaneous extraction of gallstones      [facility]  . Knee arthroscopy      R knee- at [facility]  . Cholecystectomy      open  . Video bronchoscopy  04/08/2012     Procedure: VIDEO BRONCHOSCOPY;  Surgeon: Gaye Pollack, MD;  Location: [facility] OR;  Service: Thoracic;  Laterality: N/A;  . Partial right lung removal    . Colonoscopy  May 2014    Dr. Anthony Sar: normal   . Esophagogastroduodenoscopy  Dec 2015    [facility]. normal esophagus, patent afferent and efferent libs, no abnormalities in jejunum.   . Bone marrow biopsy Left 05/25/15  . Bone marrow aspiration Left 05/25/15    FAMILY HISTORY: Family History  Problem Relation Age of Onset  . Stroke Father 51    Deceased  . Anesthesia problems Neg Hx   . Colon cancer Neg Hx   . Cirrhosis Neg Hx    Patient has 4 children, 3 sons (ages 48, 22, and 89) who are healthy and 1 daughter who is 50 years old (healthy).  His mother is 68 years old and she is healthy and still working.  His father is 57 and recently had a stroke.  He is divorced x 2.  SOCIAL HISTORY: He notes that he quit drinking EtOH about 1 year ago, but used to drink 1 case of beer weekly for many years, more when he was younger.  He also has a 40 pack year smoking history quitting recently.  He has a distant history of dip.  He denies any illicit drug abuse.  He is currently disabled from his pancreatic cancer treatment, he reports, and used to work at [employer] as a Merchant navy officer.  PERFORMANCE STATUS: The patient's performance status is 2 - Symptomatic, <50% confined to bed  ROS Positive for abdominal pain Complains of abdominal swelling 14 point review of systems was performed and is negative except as detailed under history of present illness and above   PHYSICAL EXAM: Most Recent Vital Signs: Blood pressure 90/76, pulse 116, temperature 97.9 F (36.6 C), temperature source Oral, resp. rate 18, weight 137 lb 14.4 oz (62.551 kg).  General: Ambulates with assistance of a cane. General appearance: alert, cooperative, appears older than stated age  Head: Normocephalic, without obvious abnormality, atraumatic Eyes: negative findings:  lids and lashes normal, conjunctivae and sclerae normal, corneas clear and pupils equal, round, reactive to light and accomodation, positive findings: small pupils Throat: normal findings: lips normal without lesions, buccal mucosa normal, palate normal, tongue midline and normal and soft palate, uvula, and tonsils normal Neck: no adenopathy, supple, symmetrical, trachea midline and thyroid not enlarged, symmetric, no tenderness/mass/nodules Lungs: clear to auscultation bilaterally and normal percussion bilaterally Heart: regular rate and rhythm, S1, S2 normal, no murmur, click, rub or gallop Abdomen:no palpable masses, thin with slight distension  moderate tenderness in the entire abdomen  Extremities: edema B/L 2+  pitting pretibially.  Skin: Skin color, texture, turgor normal. No rashes or lesions. L bone marrow biopsy site is examined and completely healed Lymph nodes: Cervical, supraclavicular, and axillary nodes normal. Neurologic: Grossly normal   LABORATORY DATA:   RADIOGRAPHY: CLINICAL DATA: Subsequent treatment strategy for pancreatic and lung cancer ; weight loss, and night sweats.  EXAM: NUCLEAR MEDICINE PET SKULL BASE TO THIGH  TECHNIQUE: 6.5 mCi F-18 FDG was injected intravenously. Full-ring PET imaging was performed from the skull base to thigh after the radiotracer. CT data was obtained and used for attenuation correction and anatomic localization.  FASTING BLOOD GLUCOSE: Value: 114 mg/dl  COMPARISON: Multiple exams, including 04/14/2015 and 01/23/2012  FINDINGS: NECK  No hypermetabolic lymph nodes in the neck.  CHEST  Increase in lingular nodular atelectasis/scarring compared to the exam from 19 days ago, with faint hypermetabolic uptake in this vicinity, maximum standard uptake value 2.1. Moderate right pleural effusion. Nodularity peripherally in both lower lobes, on the right side with maximum standard uptake value 1.4 and on the left  with maximum standard uptake value 1.5, likely from atelectasis/inflammation.  Coronary artery atherosclerosis. Mild paraseptal emphysema.  ABDOMEN/PELVIS  Prior Whipple. Extensive ascites. There is some physiologic areas of high bowel activity. In the right abdomen just above the iliac crests, there is high activity not corresponding to a visible mass or loop of bowel, maximum standard uptake value 11.0. It is possible that this represents physiologic activity in a loop of bowel which moved between the PET and CT and position. Again, I do not observe a definite soft tissue mass in this vicinity along the ascites.  Aortoiliac atherosclerotic vascular disease. Prior Whipple.  SKELETON  No focal hypermetabolic activity to suggest skeletal metastasis.  IMPRESSION: 1. Extensive ascites. No compelling findings of peritoneal carcinomatosis despite this ascites. Reason focus of high activity just above the iliac crest to the right abdomen which does not have a definite CT correlate, and which I suspect probably represents a loop of bowel which moved between the PET images and the CT images. 2. Bibasilar atelectasis/scarring, standard uptake value below thresholds for malignancy suspicion. 3. Coronary artery atherosclerosis. 4. Mild paraseptal emphysema.   Electronically Signed  By: Van Clines M.D.  On: 05/03/2015 11:53   PATHOLOGY:   BONE MARROW REPORTAL DIAGNOSIS Diagnosis Bone Marrow, Aspirate,Biopsy, and Clot - NORMOCELLULAR MARROW WITH TRILINEAGE HEMATOPOIESIS. - NO METASTATIC CARCINOMA. - SEE COMMENT. PERIPHERAL BLOOD: - NORMOCYTIC ANEMIA. - MILD LEUKOCYTOSIS. Diagnosis Note The bone marrow is normocellular with trilineage hematopoiesis. There is no dysplasia in any lineage. There is no evidence of metastatic carcinoma. Plasma cells are not increased. Overall, there is no morphologic etiology in the bone marrow for the patient's persistent  anemia. Vicente Males MD Pathologist, Electronic Signature (Case signed 05/30/2015) GROSS AND MICROSCOPIC INFORMATION Specimen Clinical  ASSESSMENT/PLAN:  Anemia PE with bilateral LE DVT's, on anticoagulation Myeloma panel with small M-spike at 0.2 gm/dl, igg monoclonal protein with lambda light chain specificity, normal kappa lamda light chain ratio Normal B12, folate Iron studies c/w anemia of chronic disease Hypoalbuminemia, elevated bilirubin, low total protein Mild elevation of ESR, elevated CRP Normal TSH Hemoccult positive X 1 Significant Weight Loss  I advised the patient that his results from his bone marrow biopsy were normal. His anemia has certainly improved over the last several months and therefore I question whether he has a component of GI related blood loss. He has a history of Hemoccult-positive stool. He has a leukocytosis but again no obvious evidence of monoclonality.  I  advised the patient that based upon his liver issues he should not drink alcohol at all. We discussed the serious nature of cirrhosis. I recommended ongoing observation and plan on seeing him back in 2 months with labs. If counts remain stable at that time we will move his visits out.  All questions were answered. The patient knows to call the clinic with any problems, questions or concerns. We can certainly see the patient much sooner if necessary.  This document serves as a record of services personally performed by Ancil Linsey, MD. It was created on her behalf by Toni Amend, a trained medical scribe. The creation of this record is based on the scribe's personal observations and the provider's statements to them. This document has been checked and approved by the attending provider.  I have reviewed the above documentation for accuracy and completeness, and I agree with the above.  This note is electronically signed by: Molli Hazard, MD  3:39 PM

## 2015-06-09 NOTE — Progress Notes (Signed)
Quick Note:  Ceruloplasmin is low. We need to do further work-up for ?Wilson's disease. Please have complete a 24 hour urine copper through the lab as soon as possible.  ______

## 2015-06-12 ENCOUNTER — Other Ambulatory Visit: Payer: Self-pay

## 2015-06-12 DIAGNOSIS — R7401 Elevation of levels of liver transaminase levels: Secondary | ICD-10-CM

## 2015-06-12 DIAGNOSIS — R740 Nonspecific elevation of levels of transaminase and lactic acid dehydrogenase [LDH]: Principal | ICD-10-CM

## 2015-06-12 NOTE — Progress Notes (Signed)
Quick Note:  Pt is aware. Lab order has been faxed to [facility], and he will go there to pick up container for the 24 hour urine. ______

## 2015-06-15 ENCOUNTER — Encounter (HOSPITAL_COMMUNITY): Payer: Self-pay

## 2015-06-19 LAB — COPPER, URINE, 24 HOUR
COPPER, URINE (24 HR): 9 ug/L (ref 2–30)
Creatinine, Urine mg/day-CUURI: 0.63 g/(24.h) (ref 0.63–2.50)
Total Volume - CURRI: 750 mL

## 2015-06-20 ENCOUNTER — Encounter: Payer: Self-pay | Admitting: Gastroenterology

## 2015-06-20 ENCOUNTER — Ambulatory Visit (INDEPENDENT_AMBULATORY_CARE_PROVIDER_SITE_OTHER): Payer: Medicare Other | Admitting: Gastroenterology

## 2015-06-20 ENCOUNTER — Other Ambulatory Visit: Payer: Self-pay

## 2015-06-20 VITALS — BP 80/62 | HR 72 | Temp 97.7°F | Ht 72.0 in | Wt 140.2 lb

## 2015-06-20 DIAGNOSIS — K7460 Unspecified cirrhosis of liver: Secondary | ICD-10-CM | POA: Diagnosis not present

## 2015-06-20 DIAGNOSIS — K7469 Other cirrhosis of liver: Secondary | ICD-10-CM

## 2015-06-20 MED ORDER — ONDANSETRON HCL 4 MG PO TABS: 4.0000 mg | ORAL_TABLET | Freq: Three times a day (TID) | ORAL | Status: AC

## 2015-06-20 NOTE — Progress Notes (Signed)
Referring Provider: Center, Isaac Laud* Primary Care Physician:  [name]  Primary GI: Dr. Oneida Alar   Chief Complaint  Patient presents with  . Follow-up    HPI:   Patrick Lee is a 54 y.o. male presenting today with a history of pancreatic cancer s/p whipple procedure in 2008 by Dr. 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 May 2016 diagnosed with PE and bilateral DVTs. Last seen early July 2016 secondary to new onset anasarca/ascites. RUQ pain noted at that time. History of ETOH abuse in past. Elastography with F3 and F4. Hep C negative in 2014. Further serologies noteable for low ceruloplasmin at 14. 24 hour urine copper then ordered with value of 9 mcg/L. Autoimmune serologies negative.   Heme positive. Last colonoscopy in 2014. EGD up-to-date as of Dec 2015 at [facility] noting normal esophagus, patent afferent and efferent limbs, no abnormalities in jejunum. Being evaluated by Hematology/Oncology with bone marrow biopsy secondary to anemia. Iron 56, TIBC less than 70. Ferritin elevated at 459.   Has good days and bad days but overall feels better. Abdomen feels better. Still bloated but not unbearable. Wears compression socks which helps. Lasix 20 mg and aldactone 50 mg daily. Has a lot of nausea in the morning. Throws up bile. No alcohol in 4 months.   Past Medical History  Diagnosis Date  . Lateral epicondylitis   . Chest pain, unspecified   . Abdominal pain, other specified site   . Obstruction of bile duct   . Tobacco use disorder   . Anxiety   . Shortness of breath     uses oxygen at night- 2 liters   . Myocardial infarction 2007    during [place]- 2007  . Complication of anesthesia     pt. reports that he had MI while under anesth. for Whipple procedure  . Pancreatic cancer      status post Whipple  procedure.  . Lung cancer     Past Surgical History  Procedure Laterality Date  . Whipple procedure  2007  . Percutaneous extraction of gallstones      [facility]  . Knee arthroscopy      R knee- at [facility]  . Cholecystectomy      open  . Video bronchoscopy  04/08/2012    Procedure: VIDEO BRONCHOSCOPY;  Surgeon: Gaye Pollack, MD;  Location: [facility] OR;  Service: Thoracic;  Laterality: N/A;  . Partial right lung removal    . Colonoscopy  May 2014    Dr. Anthony Sar: normal   . Esophagogastroduodenoscopy  Dec 2015    [facility]. normal esophagus, patent afferent and efferent libs, no abnormalities in jejunum.   . Bone marrow biopsy Left 05/25/15  . Bone marrow aspiration Left 05/25/15    Current Outpatient Prescriptions  Medication Sig Dispense Refill  . acetaminophen (TYLENOL) 500 MG tablet Take 500 mg by mouth every 6 (six) hours as needed for mild pain or moderate pain.    Marland Kitchen enoxaparin (LOVENOX) 100 MG/ML injection Inject 1 mL (100 mg total) into the skin daily. 30 Syringe 0  . feeding supplement, ENSURE ENLIVE, (ENSURE ENLIVE) LIQD Take 237 mLs by mouth 2 (two) times daily between meals. 237 mL 12  . furosemide (LASIX) 20 MG tablet Take 1 tablet (20 mg total) by mouth daily. With aldactone in the morning.  30 tablet 3  . ondansetron (ZOFRAN) 4 MG tablet Take 1 tablet (4 mg total) by mouth every 8 (eight) hours as needed for nausea or vomiting. 30 tablet 0  . Oxycodone HCl 10 MG TABS Take 1 tablet (10 mg total) by mouth every 4 (four) hours. 50 tablet 0  . spironolactone (ALDACTONE) 25 MG tablet Take 2 tablets (50 mg total) by mouth daily. With lasix 60 tablet 3  . ferrous sulfate 325 (65 FE) MG tablet Take 325 mg by mouth 2 (two) times daily.    . OXYGEN Inhale 2 L into the lungs as needed.      No current facility-administered medications for this visit.    Allergies as of 06/20/2015  . (No Known Allergies)    Family History  Problem Relation Age of Onset  . Stroke Father 64      Deceased  . Anesthesia problems Neg Hx   . Colon cancer Neg Hx   . Cirrhosis Neg Hx     Social History   Social History  . Marital Status: Divorced    Spouse Name: N/A  . Number of Children: 4  . Years of Education: N/A   Occupational History  . UNEMPLOYED    Social History Main Topics  . Smoking status: Former Smoker -- 1.00 packs/day for 28 years    Types: Cigarettes    Quit date: 01/02/2012  . Smokeless tobacco: Never Used  . Alcohol Use: 0.0 oz/week    0 Standard drinks or equivalent per week     Comment:  Rare alcohol use  . Drug Use: No     Comment: History of illicit substance abuse (marijuna)  . Sexual Activity: Not Asked   Other Topics Concern  . None   Social History Narrative    Review of Systems: As mentioned in HPI  Physical Exam: BP 89/65 mmHg  Pulse 110  Temp(Src) 97.7 F (36.5 C)  Ht 6' (1.829 m)  Wt 140 lb 3.2 oz (63.594 kg)  BMI 19.01 kg/m2 General:   Alert and oriented. Chronically-ill appearing. Sallow appearing  Head:  Normocephalic and atraumatic. Eyes:  Conjuctiva clear without scleral icterus. Mouth:  Oral mucosa pink and moist.  Abdomen:  +BS, soft, non-tender and non-distended. No rebound or guarding. Mild discomfort RUQ Msk:  Symmetrical without gross deformities. Normal posture. Extremities:  1+ trace edema bilateral lower extremities  Neurologic:  Alert and  oriented x4;  grossly normal neurologically. Psych:  Alert and cooperative. Normal mood and affect.  Lab Results  Component Value Date   ALT 24 04/25/2015   AST 45* 04/25/2015   ALKPHOS 87 04/25/2015   BILITOT 1.4* 04/25/2015   Lab Results  Component Value Date   CREATININE 0.57* 06/20/2015   BUN 7 06/20/2015   NA 133* 06/20/2015   K 4.3 06/20/2015   CL 102 06/20/2015   CO2 25 06/20/2015   Lab Results  Component Value Date   INR 1.35 04/17/2015   INR 1.28 04/16/2015   INR 1.27 03/16/2015   Lab Results  Component Value Date   IRON 56 04/25/2015   TIBC  <70* 04/25/2015   FERRITIN 459* 04/25/2015

## 2015-06-20 NOTE — Patient Instructions (Signed)
Please take Zofran scheduled with meals and at bedtime if needed. I sent this to your pharmacy.  Please have blood work done today.   I would like for you to have a liver biopsy for [place] to see what is truly going on.   Please call if you have recurrent fluid build-up in your belly.   Return after liver biopsy.

## 2015-06-22 LAB — BASIC METABOLIC PANEL
BUN: 7 mg/dL (ref 7–25)
CALCIUM: 7.6 mg/dL — AB (ref 8.6–10.3)
CO2: 25 mmol/L (ref 20–31)
Chloride: 102 mmol/L (ref 98–110)
Creat: 0.57 mg/dL — ABNORMAL LOW (ref 0.70–1.33)
Glucose, Bld: 83 mg/dL (ref 65–99)
Potassium: 4.3 mmol/L (ref 3.5–5.3)
Sodium: 133 mmol/L — ABNORMAL LOW (ref 135–146)

## 2015-06-26 ENCOUNTER — Telehealth: Payer: Self-pay

## 2015-06-26 NOTE — Telephone Encounter (Signed)
[phone]  PATIENT CALLED TO FIND OUT IF HIS APPOINTMENT FOR HIS LIVER BIOPSY HAS BEEN SCHEDULED

## 2015-06-27 NOTE — Assessment & Plan Note (Addendum)
54 year old male with complicated medical history, history of ETOH abuse, now with evidence of F3/F4 scores on recent elastography, raising concern for chronic liver disease/early cirrhosis in the setting of ETOH abuse. Hep C negative in 2014. Continues to report ETOH abstinence. Further serologies for liver disease completed with low ceruloplasmin; subsequent 24 hour urine copper with value of 9. Autoimmune serologies negative. Ferritin elevated but could be non-specific. With complicated medical history and overall physical decline noted, would recommend liver biopsy for assistance in definitive management of liver disease. Continue low-dose diuretics (lasix 20 mg and aldactone 50 mg daily). Needs repeat BMP now. If persistent weight loss, consider updated colonoscopy. However, he is on anticoagulation due to DVT/PE; he would need to have a procedure on anticoagulation, diagnostic only. EGD fairly up-to-date. Will hold on this evaluation until after liver biopsy. As of note, he is in better spirits today with improved abdominal pain and weight is stable/slightly improved from last visit. Notes intermittent nausea in the morning. Start Zofran for supportive measures. Needs close monitoring. Return after liver biopsy for further evaluation.

## 2015-06-28 NOTE — Telephone Encounter (Signed)
Called pt and LMOM that IR will be calling to set up appt for bx

## 2015-06-28 NOTE — Progress Notes (Signed)
CC'ED TO PCP 

## 2015-07-02 NOTE — Progress Notes (Signed)
Quick Note:  No change in diuretic therapy. His BP runs low, and I don't feel he would tolerate it. Sodium also mildly low in setting of liver disease. ______

## 2015-07-03 NOTE — Progress Notes (Signed)
Quick Note:  Pt is aware of results. He said he has not heard from the Liver biopsy appt. I told him the referral has been made and I will have the referral coordinators check on that for him. ______

## 2015-07-04 ENCOUNTER — Other Ambulatory Visit (HOSPITAL_COMMUNITY): Payer: Self-pay | Admitting: Oncology

## 2015-07-04 ENCOUNTER — Other Ambulatory Visit: Payer: Self-pay

## 2015-07-04 ENCOUNTER — Ambulatory Visit (HOSPITAL_COMMUNITY)
Admission: RE | Admit: 2015-07-04 | Discharge: 2015-07-04 | Disposition: A | Discharge status: Home / Self Care | Payer: Medicare Other | Source: Ambulatory Visit | Attending: Oncology | Admitting: Oncology

## 2015-07-04 DIAGNOSIS — Z85118 Personal history of other malignant neoplasm of bronchus and lung: Secondary | ICD-10-CM | POA: Diagnosis not present

## 2015-07-04 DIAGNOSIS — R188 Other ascites: Secondary | ICD-10-CM

## 2015-07-04 NOTE — Progress Notes (Signed)
Paracentesis complete no signs of distress. 3400 ML yellow colored ascites removed.

## 2015-07-04 NOTE — Procedures (Signed)
PreOperative Dx: Ascites, history of lung and pancreatic cancer Postoperative Dx: Ascites, history of lung and pancreatic cancer Procedure:   US guided paracentesis Radiologist:  Thornton Papas Anesthesia:  10 ml of 1% lidocaine Specimen:  3400 ml of yellow ascitic fluid EBL:   < 1 ml Complications: None

## 2015-07-15 ENCOUNTER — Encounter (HOSPITAL_COMMUNITY): Payer: Self-pay | Admitting: Hematology & Oncology

## 2015-07-17 ENCOUNTER — Telehealth (INDEPENDENT_AMBULATORY_CARE_PROVIDER_SITE_OTHER): Payer: Self-pay | Admitting: *Deleted

## 2015-07-17 NOTE — Telephone Encounter (Signed)
Patrick Lee is a former patient of Dr. Olevia Perches in [place]. He was given [name] at [facility], due to being in the hospital. He requested Dr. Laural Golden but was not given him as a GI doctor. [name] is a cancer survivor and would like to see if [facility] would take him back as a patient. Doesn't want to go back to [place]. They put an order in on 06/20/15 for a liver biopsy and has not heard anything from them at this time. He has tried calling the office and feels like he gets the run-around. His return phone number is [phone].

## 2015-07-21 ENCOUNTER — Other Ambulatory Visit: Payer: Self-pay | Admitting: Radiology

## 2015-07-21 ENCOUNTER — Ambulatory Visit (HOSPITAL_COMMUNITY)
Admission: RE | Admit: 2015-07-21 | Discharge: 2015-07-21 | Disposition: A | Discharge status: Home / Self Care | Payer: Medicare Other | Source: Ambulatory Visit | Attending: Oncology | Admitting: Oncology

## 2015-07-21 DIAGNOSIS — Z85118 Personal history of other malignant neoplasm of bronchus and lung: Secondary | ICD-10-CM | POA: Insufficient documentation

## 2015-07-21 DIAGNOSIS — R188 Other ascites: Secondary | ICD-10-CM | POA: Insufficient documentation

## 2015-07-21 DIAGNOSIS — Z8507 Personal history of malignant neoplasm of pancreas: Secondary | ICD-10-CM | POA: Diagnosis not present

## 2015-07-21 NOTE — Procedures (Signed)
PreOperative Dx: Ascites Postoperative Dx: Ascites Procedure:   US guided paracentesis Radiologist:  Thornton Papas Anesthesia:  10 ml of 1% lidocaine Specimen:  5000 ml of clear yellow ascitic fluid EBL:   < 1 ml Complications: None

## 2015-07-24 ENCOUNTER — Other Ambulatory Visit: Payer: Self-pay | Admitting: Gastroenterology

## 2015-07-24 ENCOUNTER — Encounter (HOSPITAL_COMMUNITY): Payer: Self-pay

## 2015-07-24 ENCOUNTER — Encounter: Payer: Self-pay | Admitting: General Practice

## 2015-07-24 ENCOUNTER — Telehealth: Payer: Self-pay

## 2015-07-24 ENCOUNTER — Ambulatory Visit (HOSPITAL_COMMUNITY)
Admission: RE | Admit: 2015-07-24 | Discharge: 2015-07-24 | Disposition: A | Discharge status: Home / Self Care | Payer: Medicare Other | Source: Ambulatory Visit | Attending: Gastroenterology | Admitting: Gastroenterology

## 2015-07-24 DIAGNOSIS — Z7902 Long term (current) use of antithrombotics/antiplatelets: Secondary | ICD-10-CM | POA: Diagnosis not present

## 2015-07-24 DIAGNOSIS — Z87891 Personal history of nicotine dependence: Secondary | ICD-10-CM | POA: Insufficient documentation

## 2015-07-24 DIAGNOSIS — D649 Anemia, unspecified: Secondary | ICD-10-CM | POA: Diagnosis not present

## 2015-07-24 DIAGNOSIS — K7469 Other cirrhosis of liver: Secondary | ICD-10-CM

## 2015-07-24 DIAGNOSIS — Z85118 Personal history of other malignant neoplasm of bronchus and lung: Secondary | ICD-10-CM | POA: Insufficient documentation

## 2015-07-24 DIAGNOSIS — K7460 Unspecified cirrhosis of liver: Secondary | ICD-10-CM | POA: Insufficient documentation

## 2015-07-24 DIAGNOSIS — F419 Anxiety disorder, unspecified: Secondary | ICD-10-CM | POA: Insufficient documentation

## 2015-07-24 DIAGNOSIS — Z86711 Personal history of pulmonary embolism: Secondary | ICD-10-CM | POA: Diagnosis not present

## 2015-07-24 DIAGNOSIS — Z9981 Dependence on supplemental oxygen: Secondary | ICD-10-CM | POA: Diagnosis not present

## 2015-07-24 DIAGNOSIS — R188 Other ascites: Secondary | ICD-10-CM | POA: Insufficient documentation

## 2015-07-24 DIAGNOSIS — I252 Old myocardial infarction: Secondary | ICD-10-CM | POA: Diagnosis not present

## 2015-07-24 DIAGNOSIS — Z8507 Personal history of malignant neoplasm of pancreas: Secondary | ICD-10-CM | POA: Diagnosis not present

## 2015-07-24 DIAGNOSIS — Z86718 Personal history of other venous thrombosis and embolism: Secondary | ICD-10-CM | POA: Diagnosis not present

## 2015-07-24 LAB — CBC
HEMATOCRIT: 29.2 % — AB (ref 39.0–52.0)
HEMOGLOBIN: 10.8 g/dL — AB (ref 13.0–17.0)
MCH: 35.5 pg — ABNORMAL HIGH (ref 26.0–34.0)
MCHC: 36.9 g/dL — ABNORMAL HIGH (ref 30.0–36.0)
MCV: 96.1 fL (ref 78.0–100.0)
Platelets: 259 10*3/uL (ref 150–400)
RBC: 3.04 MIL/uL — AB (ref 4.22–5.81)
RDW: 15.4 % (ref 11.5–15.5)
WBC: 11.9 10*3/uL — ABNORMAL HIGH (ref 4.0–10.5)

## 2015-07-24 LAB — PROTIME-INR
INR: 1.25 (ref 0.00–1.49)
PROTHROMBIN TIME: 15.8 s — AB (ref 11.6–15.2)

## 2015-07-24 LAB — APTT: APTT: 37 s (ref 24–37)

## 2015-07-24 MED ORDER — MIDAZOLAM HCL 2 MG/2ML IJ SOLN
INTRAMUSCULAR | Status: AC
  Filled 2015-07-24: qty 2

## 2015-07-24 MED ORDER — FENTANYL CITRATE (PF) 100 MCG/2ML IJ SOLN
INTRAMUSCULAR | Status: AC | PRN
  Administered 2015-07-24 (×5): 25 ug via INTRAVENOUS
  Administered 2015-07-24: 50 ug via INTRAVENOUS

## 2015-07-24 MED ORDER — MIDAZOLAM HCL 2 MG/2ML IJ SOLN
INTRAMUSCULAR | Status: AC | PRN
  Administered 2015-07-24 (×6): 0.5 mg via INTRAVENOUS

## 2015-07-24 MED ORDER — FENTANYL CITRATE (PF) 100 MCG/2ML IJ SOLN
INTRAMUSCULAR | Status: AC
  Filled 2015-07-24: qty 2

## 2015-07-24 MED ORDER — GELATIN ABSORBABLE 12-7 MM EX MISC
CUTANEOUS | Status: AC
  Filled 2015-07-24: qty 1

## 2015-07-24 MED ORDER — IOHEXOL 300 MG/ML  SOLN
50.0000 mL | Freq: Once | INTRAMUSCULAR | Status: DC | PRN
  Administered 2015-07-24: 50 mL via INTRAVENOUS
  Filled 2015-07-24: qty 50

## 2015-07-24 MED ORDER — SODIUM CHLORIDE 0.9 % IV SOLN
INTRAVENOUS | Status: AC | PRN
  Administered 2015-07-24: 10 mL/h via INTRAVENOUS

## 2015-07-24 MED ORDER — SODIUM CHLORIDE 0.9 % IV SOLN
Freq: Once | INTRAVENOUS | Status: AC
  Administered 2015-07-24: 07:00:00 via INTRAVENOUS

## 2015-07-24 MED ORDER — LIDOCAINE HCL 1 % IJ SOLN
INTRAMUSCULAR | Status: AC
  Filled 2015-07-24: qty 20

## 2015-07-24 NOTE — Sedation Documentation (Signed)
Patient is resting comfortably. 

## 2015-07-24 NOTE — Discharge Instructions (Signed)
Liver Biopsy, Care After °These instructions give you information on caring for yourself after your procedure. Your doctor may also give you more specific instructions. Call your doctor if you have any problems or questions after your procedure. °HOME CARE °· Rest at home for 1-2 days or as told by your doctor. °· Have someone stay with you for at least 24 hours. °· Do not do these things in the first 24 hours: °¨ Drive. °¨ Use machinery. °¨ Take care of other people. °¨ Sign legal documents. °¨ Take a bath or shower. °· There are many different ways to close and cover a cut (incision). For example, a cut can be closed with stitches, skin glue, or adhesive strips. Follow your doctor's instructions on: °¨ Taking care of your cut. °¨ Changing and removing your bandage (dressing). °¨ Removing whatever was used to close your cut. °· Do not drink alcohol in the first week. °· Do not lift more than 5 pounds or play contact sports for the first 2 weeks. °· Take medicines only as told by your doctor. For 1 week, do not take medicine that has aspirin in it or medicines like ibuprofen. °· Get your test results. °GET HELP IF: °· A cut bleeds and leaves more than just a small spot of blood. °· A cut is red, puffs up (swells), or hurts more than before. °· Fluid or something else comes from a cut. °· A cut smells bad. °· You have a fever or chills. °GET HELP RIGHT AWAY IF: °· You have swelling, bloating, or pain in your belly (abdomen). °· You get dizzy or faint. °· You have a rash. °· You feel sick to your stomach (nauseous) or throw up (vomit). °· You have trouble breathing, feel short of breath, or feel faint. °· Your chest hurts. °· You have problems talking or seeing. °· You have trouble balancing or moving your arms or legs. °Document Released: 07/30/2008 Document Revised: 03/07/2014 Document Reviewed: 12/17/2013 °Exit[employer]® Patient Information ©2015 [employer]. This information is not intended to replace advice given to  you by your health care provider. Make sure you discuss any questions you have with your health care provider. ° °

## 2015-07-24 NOTE — Procedures (Signed)
Interventional Radiology Procedure Note  Procedure: Image guided, trans-jugular liver bx.  4 x 20G biopsy into saline.  Complications: None Recommendations:  - observe 2 hours.  - Ok to shower tomorrow - Do not submerge for 7 days - Routine care   Signed,  Dulcy Fanny. Earleen Newport, DO

## 2015-07-24 NOTE — Telephone Encounter (Signed)
T/C from [place] at [facility], said pt is requesting a para while he is there today to have a liver biopsy. I told him I would page Laban Emperor, NP and return his call. I paged Vicente Males and she said pt had para on 07/21/2015 and she is not comfortable to order another para at this time. ( She said also see note where pt has requested to go back to Dr. Laural Golden). I called back to inform Gerald Stabs and spoke to [place] and left message that Vicente Males is not comfortable to do the para. She asked for Dr. Nona Dell pager number.  ( Per Vicente Males forwarding to [place] to review info)

## 2015-07-24 NOTE — Sedation Documentation (Signed)
Patient denies pain and is resting comfortably.  

## 2015-07-24 NOTE — Telephone Encounter (Signed)
PARACENTESIS VO GIVEN

## 2015-07-24 NOTE — Telephone Encounter (Signed)
Discharge letter mailed  

## 2015-07-24 NOTE — Telephone Encounter (Signed)
PT WANT DR. Laural Golden AS HIS PRIMARY GI DOCTOR. PLEASE SEND A LETTER TO D/C FROM PRACTICE.

## 2015-07-24 NOTE — Telephone Encounter (Signed)
REVIEWED-NO ADDITIONAL RECOMMENDATIONS. 

## 2015-07-24 NOTE — Sedation Documentation (Signed)
12 mm gel foam given

## 2015-07-24 NOTE — H&P (Signed)
Chief Complaint: Patient was seen in consultation today for ascites and cirrhosis at the request of Sams,Anna W  Referring Physician(s): Sams,Anna W  History of Present Illness: Patrick Lee is a 53 y.o. male with recurrent ascites s/p multiple paracentesis who has been seen by GI with findings consistent with cirrhosis. He does have a history of ETOH abuse, denies any current use. He is scheduled today for image guided transjugular liver biopsy. He denies any chest pain, shortness of breath or palpitations. He denies any active signs of bleeding or excessive bruising. He denies any recent fever or chills. The patient denies any history of sleep apnea or chronic oxygen use. He does state that he is suppose to wear 2L O2 at night, but does not. He has previously tolerated sedation without complications.    Past Medical History  Diagnosis Date  . Lateral epicondylitis   . Chest pain, unspecified   . Abdominal pain, other specified site   . Obstruction of bile duct   . Tobacco use disorder   . Anxiety   . Shortness of breath     uses oxygen at night- 2 liters   . Myocardial infarction 2007    during [place]- 2007  . Complication of anesthesia     pt. reports that he had MI while under anesth. for Whipple procedure  . Pancreatic cancer      status post Whipple procedure.  . Lung cancer     Past Surgical History  Procedure Laterality Date  . Whipple procedure  2007  . Percutaneous extraction of gallstones      [facility]  . Knee arthroscopy      [facility]- at [facility]  . Cholecystectomy      open  . Video bronchoscopy  04/08/2012    Procedure: VIDEO BRONCHOSCOPY;  Surgeon: Gaye Pollack, MD;  Location: [facility] OR;  Service: Thoracic;  Laterality: N/A;  . Partial right lung removal    . Colonoscopy  May 2014    Dr. Anthony Sar: normal   . Esophagogastroduodenoscopy  Dec 2015    [facility]. normal esophagus, patent afferent and efferent libs, no  abnormalities in jejunum.   . Bone marrow biopsy Left 05/25/15  . Bone marrow aspiration Left 05/25/15    Allergies: Review of patient's allergies indicates no known allergies.  Medications: Prior to Admission medications   Medication Sig Start Date End Date Taking? Authorizing Provider  enoxaparin (LOVENOX) 100 MG/ML injection Inject 1 mL (100 mg total) into the skin daily. 04/25/15  Yes Baird Cancer, PA-C  feeding supplement, ENSURE ENLIVE, (ENSURE ENLIVE) LIQD Take 237 mLs by mouth 2 (two) times daily between meals. Patient taking differently: Take 237 mLs by mouth daily as needed (for weakness feeling).  04/23/15  Yes Maryann Mikhail, DO  furosemide (LASIX) 20 MG tablet Take 1 tablet (20 mg total) by mouth daily. With aldactone in the morning. 05/12/15  Yes Orvil Feil, NP  OVER THE COUNTER MEDICATION Apply 1 application topically as needed (for hemorrhoids). OTC Hemorrhoid cream   Yes Historical Provider, MD  spironolactone (ALDACTONE) 25 MG tablet Take 2 tablets (50 mg total) by mouth daily. With lasix 05/12/15  Yes Orvil Feil, NP  acetaminophen (TYLENOL) 500 MG tablet Take 500 mg by mouth every 6 (six) hours as needed for mild pain or moderate pain.    Historical Provider, MD  ondansetron (ZOFRAN) 4 MG tablet Take 1 tablet (4 mg total) by  mouth every 8 (eight) hours as needed for nausea or vomiting. Patient not taking: Reported on 07/20/2015 04/23/15   Maryann Mikhail, DO  ondansetron (ZOFRAN) 4 MG tablet Take 1 tablet (4 mg total) by mouth 4 (four) times daily -  with meals and at bedtime. Patient not taking: Reported on 07/20/2015 06/20/15   Orvil Feil, NP  Oxycodone HCl 10 MG TABS Take 1 tablet (10 mg total) by mouth every 4 (four) hours. Patient not taking: Reported on 07/20/2015 06/08/15   Patrici Ranks, MD     Family History  Problem Relation Age of Onset  . Stroke Father 50    Deceased  . Anesthesia problems Neg Hx   . Colon cancer Neg Hx   . Cirrhosis Neg Hx     Social  History   Social History  . Marital Status: Divorced    Spouse Name: N/A  . Number of Children: 4  . Years of Education: N/A   Occupational History  . UNEMPLOYED    Social History Main Topics  . Smoking status: Former Smoker -- 1.00 packs/day for 28 years    Types: Cigarettes    Quit date: 01/02/2012  . Smokeless tobacco: Never Used  . Alcohol Use: 0.0 oz/week    0 Standard drinks or equivalent per week     Comment:  Rare alcohol use  . Drug Use: No     Comment: History of illicit substance abuse (marijuna)  . Sexual Activity: Not Asked   Other Topics Concern  . None   Social History Narrative   Review of Systems: A 12 point ROS discussed and pertinent positives are indicated in the HPI above.  All other systems are negative.  Review of Systems  Vital Signs: BP 93/64 mmHg  Pulse 99  Temp(Src) 98.5 F (36.9 C) (Oral)  Resp 16  Ht 6' (1.829 m)  Wt 131 lb (59.421 kg)  BMI 17.76 kg/m2  SpO2 100%  Physical Exam  Constitutional: He is oriented to person, place, and time. No distress.  HENT:  Head: Normocephalic and atraumatic.  Neck: No JVD present. No tracheal deviation present.  Cardiovascular: Normal rate and regular rhythm.  Exam reveals no gallop and no friction rub.   No murmur heard. Pulmonary/Chest: Effort normal and breath sounds normal. No respiratory distress. He has no wheezes. He has no rales.  Abdominal: Soft. He exhibits no distension. There is no tenderness.  Neurological: He is alert and oriented to person, place, and time.  Skin: Skin is warm and dry. He is not diaphoretic.    Mallampati Score:  MD Evaluation Airway: WNL Heart: WNL Abdomen: WNL Chest/ Lungs: WNL ASA  Classification: 3 Mallampati/Airway Score: One  Imaging: US Paracentesis  07/21/2015   CLINICAL DATA:  Ascites, pancreatic cancer, lung cancer  EXAM: ULTRASOUND GUIDED THERAPEUTIC PARACENTESIS  COMPARISON:  07/04/2015  PROCEDURE: Procedure, benefits, and risks of procedure  were discussed with patient.  Written informed consent for procedure was obtained.  Time out protocol followed.  Adequate collection of ascites localized by ultrasound in LEFT lower quadrant.  Skin prepped and draped in usual sterile fashion.  Skin and soft tissues anesthetized with 10 mL of 1% lidocaine.  5 [place] Yueh catheter placed into peritoneal cavity.  5000 mL of clear yellow fluid aspirated by vacuum bottle suction.  Procedure tolerated well by patient without immediate complication.  COMPLICATIONS: None  FINDINGS: As above  IMPRESSION: Successful ultrasound guided paracentesis yielding 5000 mL of ascites.   Electronically Signed  By: Lavonia Dana M.D.   On: 07/21/2015 15:49   US Paracentesis  07/04/2015   CLINICAL DATA:  Recurrent ascites, history lung cancer, pancreatic cancer  EXAM: ULTRASOUND GUIDED DIAGNOSTIC AND THERAPEUTIC PARACENTESIS  COMPARISON:  05/26/2015  PROCEDURE: Procedure, benefits, and risks of procedure were discussed with patient.  Written informed consent for procedure was obtained.  Time out protocol followed.  Adequate collection of ascites localized by ultrasound in lateral LEFT mid abdomen.  Skin prepped and draped in usual sterile fashion.  Skin and soft tissues anesthetized with 10 mL of 1% lidocaine.  5 [place] Yueh catheter placed into peritoneal cavity.  3400 mL of clear slightly yellow fluid aspirated by vacuum bottle suction.  Procedure tolerated well by patient without immediate complication.  COMPLICATIONS: None.  FINDINGS: A total of approximately 3400 mL of ascitic fluid was removed. A fluid sample of 120 mL was sent for laboratory analysis.  IMPRESSION: Successful ultrasound guided paracentesis yielding 3400 mL of ascites.   Electronically Signed   By: Lavonia Dana M.D.   On: 07/04/2015 15:32    Labs:  CBC:  Recent Labs  04/25/15 1728 05/19/15 1310 05/25/15 0820 07/24/15 0655  WBC 15.7* 10.8* 12.7* 11.9*  HGB 10.8* 11.2* 10.9* 10.8*  HCT 30.9* 31.7* 30.9*  29.2*  PLT 298 287 366 259    COAGS:  Recent Labs  03/16/15 2030 04/16/15 0350 04/17/15 0453 07/24/15 0655  INR 1.27 1.28 1.35 1.25  APTT  --   --   --  37    BMP:  Recent Labs  04/20/15 0558 04/22/15 0400 04/23/15 0435 04/25/15 1728 05/19/15 1300 06/20/15 1549  NA 134* 133* 135 134* 134* 133*  K 3.7 4.2 4.3 3.9 4.9 4.3  CL 102 101 104 100* 105 102  CO2 _0 GLUCOSE 66 69 67 276* 104* 83  BUN <5* 5* <5* 7 3* 7  CALCIUM 7.7* 7.7* 7.8* 8.3* 7.7* 7.6*  CREATININE 0.67 0.62 0.69 0.99 0.53 0.57*  GFRNONAA >60 >60 >60 >60  --   --   GFRAA >60 >60 >60 >60  --   --     LIVER FUNCTION TESTS:  Recent Labs  04/19/15 0515 04/20/15 0558 04/23/15 0435 04/25/15 1728  BILITOT 1.1 1.9* 1.7* 1.4*  AST 60* 49* 32 45*  ALT _1 ALKPHOS 79 63 80 87  PROT 3.7* 3.9* 4.1* 6.0*  ALBUMIN 2.0* 2.3* 2.1* 2.8*    TUMOR MARKERS:  Recent Labs  04/17/15 0453  AFPTM 1.3  CEA 5.6*  CA199 78*    Assessment and Plan: Recurrent ascites s/p multiple paracentesis Seen by GI Cirrhosis History of ETOH abuse, denies any current use Scheduled today for image guided transjugular liver biopsy with sedation and possible paracentesis 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 Risks and Benefits discussed with the patient including, but not limited to bleeding, infection, damage to adjacent structures, contrast induced renal failure, or low yield requiring additional tests. All of the patient's questions were answered, patient is agreeable to proceed. Consent signed and in chart. History of pancreatic cancer s/p whipple Anemia seen by Oncology    Thank you for this interesting consult.  I greatly enjoyed meeting Leida Lauth and look forward to participating in their care.  A copy of this report was sent to the requesting provider on this date.  Signed: Tsosie Billing  D 07/24/2015, 8:16 AM   I spent a total of 30 Minutes in face to face in clinical consultation, greater than 50% of which was counseling/coordinating care for ascites, cirrhosis.

## 2015-07-26 ENCOUNTER — Encounter: Payer: Self-pay | Admitting: Hematology & Oncology

## 2015-07-27 ENCOUNTER — Other Ambulatory Visit (HOSPITAL_COMMUNITY): Payer: Self-pay | Admitting: Oncology

## 2015-07-27 DIAGNOSIS — R601 Generalized edema: Secondary | ICD-10-CM

## 2015-07-28 ENCOUNTER — Other Ambulatory Visit (HOSPITAL_COMMUNITY): Payer: Medicare Other

## 2015-07-28 ENCOUNTER — Ambulatory Visit (HOSPITAL_COMMUNITY)
Admission: RE | Admit: 2015-07-28 | Discharge: 2015-07-28 | Disposition: A | Discharge status: Home / Self Care | Payer: Medicare Other | Source: Ambulatory Visit | Attending: Oncology | Admitting: Oncology

## 2015-07-28 DIAGNOSIS — R601 Generalized edema: Secondary | ICD-10-CM | POA: Diagnosis not present

## 2015-07-28 NOTE — Sedation Documentation (Signed)
Patient denies pain and is resting comfortably.  

## 2015-07-28 NOTE — Sedation Documentation (Signed)
Vital signs stable. 

## 2015-07-28 NOTE — Sedation Documentation (Signed)
MD at bedside. 

## 2015-07-28 NOTE — Sedation Documentation (Signed)
Tolerating procedure without difficulty. Denies any pain

## 2015-07-28 NOTE — Sedation Documentation (Signed)
Numbing medication instilled by MD

## 2015-07-28 NOTE — Sedation Documentation (Signed)
5000cc yellow fluid drained from abdomen without difficulty. Pt tolerated well.

## 2015-07-28 NOTE — Sedation Documentation (Signed)
Specimens collected by MD

## 2015-07-28 NOTE — Sedation Documentation (Signed)
Patient is resting comfortably. 

## 2015-07-31 ENCOUNTER — Telehealth: Payer: Self-pay

## 2015-07-31 ENCOUNTER — Other Ambulatory Visit: Payer: Self-pay

## 2015-07-31 DIAGNOSIS — R188 Other ascites: Secondary | ICD-10-CM

## 2015-07-31 NOTE — Telephone Encounter (Signed)
Please refer to the telephone encounter from [facility]. Apt has been scheduled for 08/10/15 with Deberah Castle, NP.

## 2015-07-31 NOTE — Telephone Encounter (Signed)
Pt is set up for a PARA on 08/01/15 @ 10:00. He is aware

## 2015-07-31 NOTE — Telephone Encounter (Signed)
We don't have the results yet because they had to be sent out. We can have him set up for an LVAP with Albumin 25 g IV at 4 liters.   HE WAS DISMISSED FROM OUR PRACTICE a few days ago, but we are available for 30 days after receiving the letter. It appears he was trying to see Dr. Laural Golden, but I don't see where an appt has been scheduled.   Patient needs to be informed of this. I know a letter was sent out, but he needs to establish care with another GI as we have dismissed him. I will follow-up on the results from the pathology and this paracentesis, of course.   Copying Rosendo Gros on this as it is a complicated situation.

## 2015-07-31 NOTE — Telephone Encounter (Signed)
Apt has been scheduled for 08/10/15 with Deberah Castle, NP.

## 2015-07-31 NOTE — Telephone Encounter (Signed)
I spoke with Lelon Frohlich at Dr. Olevia Perches office and she is going to get the patient scheduled to see Dr. Laural Golden.

## 2015-07-31 NOTE — Telephone Encounter (Signed)
PLEASE CALL PATIENT [phone]

## 2015-07-31 NOTE — Telephone Encounter (Signed)
I spoke to [place], I will forward to Butch Penny to schedule patient for office visit with Terri.

## 2015-07-31 NOTE — Telephone Encounter (Signed)
Pt is aware that the result can take up till 10 days. He stated that they told him that we would have them by now. He is also gathering fluid and would like to know what is going on .He is not able to eat or drink because of the fluid. Please advise.

## 2015-07-31 NOTE — Telephone Encounter (Signed)
I've tried to reach out to Patrick Lee office in regards to [name] switching his care to Patrick Lee, however I was unable to speak to someone.   I will rout to Patrick Lee for follow-up.  Please see telephone encounter from Patrick Lee on 9/12th

## 2015-08-01 ENCOUNTER — Other Ambulatory Visit: Payer: Self-pay | Admitting: Gastroenterology

## 2015-08-01 ENCOUNTER — Ambulatory Visit (HOSPITAL_COMMUNITY)
Admission: RE | Admit: 2015-08-01 | Discharge: 2015-08-01 | Disposition: A | Discharge status: Home / Self Care | Payer: Medicare Other | Source: Ambulatory Visit | Attending: Gastroenterology | Admitting: Gastroenterology

## 2015-08-01 DIAGNOSIS — R188 Other ascites: Secondary | ICD-10-CM

## 2015-08-03 ENCOUNTER — Other Ambulatory Visit: Payer: Self-pay | Admitting: Gastroenterology

## 2015-08-03 ENCOUNTER — Other Ambulatory Visit (INDEPENDENT_AMBULATORY_CARE_PROVIDER_SITE_OTHER): Payer: Self-pay | Admitting: Internal Medicine

## 2015-08-03 ENCOUNTER — Encounter (HOSPITAL_COMMUNITY): Payer: Self-pay

## 2015-08-03 ENCOUNTER — Telehealth: Payer: Self-pay

## 2015-08-03 ENCOUNTER — Other Ambulatory Visit (HOSPITAL_COMMUNITY): Payer: Self-pay | Admitting: Hematology & Oncology

## 2015-08-03 DIAGNOSIS — R188 Other ascites: Secondary | ICD-10-CM

## 2015-08-03 NOTE — Telephone Encounter (Signed)
Xray needs someone to sign the order so pt can have para done on 08/04/2015.

## 2015-08-03 NOTE — Telephone Encounter (Signed)
Patrick Lee from [facility] department called and states that pt was to have a para on 08/01/2015 but there was no fluid to have drawn off and pt is wanting to have a para set for 08/04/2015.  Pt just needs someone to  Please advise

## 2015-08-04 ENCOUNTER — Ambulatory Visit (HOSPITAL_COMMUNITY): Payer: Medicare Other

## 2015-08-04 ENCOUNTER — Encounter (HOSPITAL_COMMUNITY): Payer: Self-pay | Admitting: Emergency Medicine

## 2015-08-04 ENCOUNTER — Emergency Department (HOSPITAL_COMMUNITY)
Admission: EM | Admit: 2015-08-04 | Discharge: 2015-08-04 | Disposition: A | Discharge status: Home / Self Care | Payer: Medicare Other | Attending: Emergency Medicine | Admitting: Emergency Medicine

## 2015-08-04 ENCOUNTER — Emergency Department (HOSPITAL_COMMUNITY): Payer: Medicare Other

## 2015-08-04 DIAGNOSIS — Z7901 Long term (current) use of anticoagulants: Secondary | ICD-10-CM | POA: Insufficient documentation

## 2015-08-04 DIAGNOSIS — R110 Nausea: Secondary | ICD-10-CM | POA: Diagnosis not present

## 2015-08-04 DIAGNOSIS — Z87891 Personal history of nicotine dependence: Secondary | ICD-10-CM | POA: Diagnosis not present

## 2015-08-04 DIAGNOSIS — R000 Tachycardia, unspecified: Secondary | ICD-10-CM | POA: Insufficient documentation

## 2015-08-04 DIAGNOSIS — R140 Abdominal distension (gaseous): Secondary | ICD-10-CM | POA: Diagnosis present

## 2015-08-04 DIAGNOSIS — Z8659 Personal history of other mental and behavioral disorders: Secondary | ICD-10-CM | POA: Insufficient documentation

## 2015-08-04 DIAGNOSIS — R601 Generalized edema: Secondary | ICD-10-CM | POA: Insufficient documentation

## 2015-08-04 DIAGNOSIS — Z8739 Personal history of other diseases of the musculoskeletal system and connective tissue: Secondary | ICD-10-CM | POA: Diagnosis not present

## 2015-08-04 DIAGNOSIS — Z85118 Personal history of other malignant neoplasm of bronchus and lung: Secondary | ICD-10-CM | POA: Diagnosis not present

## 2015-08-04 DIAGNOSIS — Z8719 Personal history of other diseases of the digestive system: Secondary | ICD-10-CM | POA: Insufficient documentation

## 2015-08-04 DIAGNOSIS — Z79899 Other long term (current) drug therapy: Secondary | ICD-10-CM | POA: Diagnosis not present

## 2015-08-04 DIAGNOSIS — Z8507 Personal history of malignant neoplasm of pancreas: Secondary | ICD-10-CM | POA: Insufficient documentation

## 2015-08-04 DIAGNOSIS — R630 Anorexia: Secondary | ICD-10-CM | POA: Insufficient documentation

## 2015-08-04 DIAGNOSIS — I252 Old myocardial infarction: Secondary | ICD-10-CM | POA: Insufficient documentation

## 2015-08-04 NOTE — ED Notes (Signed)
Pt reports frequent ascites and abdominal bloating. Pt c/o SOB and states his scrotum and abdomen are swollen from fluid.

## 2015-08-04 NOTE — Procedures (Signed)
PreOperative Dx: Ascites Postoperative Dx: Ascites Procedure:   US guided paracentesis Radiologist:  Thornton Papas Anesthesia:  10 ml of 1% lidocaine Specimen:  4700 ml of yellow ascitic fluid EBL:   < 1 ml Complications: None

## 2015-08-04 NOTE — Progress Notes (Signed)
Quick Note:  Routing to Doris to call the patient ______

## 2015-08-04 NOTE — Telephone Encounter (Signed)
I spoke with the patient and told him that Dr. Oneida Alar stated she will not be doing the para order.  He stated he feels weak.  I told the patient he could go to [facility] ER if he feels bad.  He thanked me and disconnected the call.  Routing to Dr. Oneida Alar to see if she has any further recommendations.

## 2015-08-04 NOTE — ED Notes (Signed)
US at bedside

## 2015-08-04 NOTE — ED Notes (Signed)
Pt made aware to return if symptoms worsen or if any life threatening symptoms occur.   

## 2015-08-04 NOTE — Telephone Encounter (Signed)
REVIEWED.  

## 2015-08-04 NOTE — Discharge Instructions (Signed)
As discussed, it is important that you follow up as soon as possible with your physician for continued management of your condition. ° °If you develop any new, or concerning changes in your condition, please return to the emergency department immediately. ° °

## 2015-08-04 NOTE — Telephone Encounter (Signed)
PLEASE CALL PT. HE IS NOW ESTABLISHING CARE WITH DR. Laural Golden SO HE SHOULD WAIT UNTIL HE SEES THEM. HE HAD VERY LITTLE FLUID IN HIS ABDOMEN ON SEP 27 AND I DOUBT HE NEED ANOTHER PARACENTESIS AT THIS POINT. HE SHOULD WAIT UNTIL HIS NEXT GI VIST WITH DR. Olevia Perches OFC.

## 2015-08-04 NOTE — Telephone Encounter (Signed)
Pt is aware that we will not be ordering anymore [place] PARA and that he will have to call [place] office.

## 2015-08-04 NOTE — ED Provider Notes (Signed)
CSN: [id number]     Arrival date & time 08/04/15  1138 History   First MD Initiated Contact with Patient 08/04/15 1257     Chief Complaint  Patient presents with  . Ascites     (Consider location/radiation/quality/duration/timing/severity/associated sxs/prior Treatment) HPI Patient presents with concern of increasing abdominal discomfort This episode has become more prominent over the past few days, as the patient has had increasing distention throughout the abdomen, diffusely. The pain is sore or There is associated swelling in the scrotum, but no urinary complains. Patient is associated anorexia, nausea, postprandially secondary to pressure. Patient is a notable history of cirrhosis, recurrent ascites, requiring weekly paracentesis. Patient's last paracentesis was one week ago. Patient had a liver biopsy performed within the past 2 weeks, is awaiting results. Patient is also transitioning from one gastroenterologist to another, has not yet initiated care.  Past Medical History  Diagnosis Date  . Lateral epicondylitis   . Chest pain, unspecified   . Abdominal pain, other specified site   . Obstruction of bile duct   . Tobacco use disorder   . Anxiety   . Shortness of breath     uses oxygen at night- 2 liters   . Myocardial infarction 2007    during [place]- 2007  . Complication of anesthesia     pt. reports that he had MI while under anesth. for Whipple procedure  . Pancreatic cancer      status post Whipple procedure.  . Lung cancer    Past Surgical History  Procedure Laterality Date  . Whipple procedure  2007  . Percutaneous extraction of gallstones      [facility]  . Knee arthroscopy      R knee- at [facility]  . Cholecystectomy      open  . Video bronchoscopy  04/08/2012    Procedure: VIDEO BRONCHOSCOPY;  Surgeon: Gaye Pollack, MD;  Location: [facility] OR;  Service: Thoracic;  Laterality: N/A;  . Partial right lung removal    . Colonoscopy   May 2014    Dr. Anthony Sar: normal   . Esophagogastroduodenoscopy  Dec 2015    [facility]. normal esophagus, patent afferent and efferent libs, no abnormalities in jejunum.   . Bone marrow biopsy Left 05/25/15  . Bone marrow aspiration Left 05/25/15   Family History  Problem Relation Age of Onset  . Stroke Father 53    Deceased  . Anesthesia problems Neg Hx   . Colon cancer Neg Hx   . Cirrhosis Neg Hx    Social History  Substance Use Topics  . Smoking status: Former Smoker -- 1.00 packs/day for 28 years    Types: Cigarettes    Quit date: 01/02/2012  . Smokeless tobacco: Never Used  . Alcohol Use: 0.0 oz/week    0 Standard drinks or equivalent per week     Comment:  Rare alcohol use    Review of Systems  Constitutional:       Per HPI, otherwise negative  HENT:       Per HPI, otherwise negative  Respiratory:       Per HPI, otherwise negative  Cardiovascular:       Per HPI, otherwise negative  Gastrointestinal: Positive for nausea. Negative for vomiting.  Endocrine:       Negative aside from HPI  Genitourinary:       Neg aside from HPI   Musculoskeletal:       Per HPI, otherwise negative  Skin:  Negative.   Neurological: Negative for syncope.      Allergies  Review of patient's allergies indicates no known allergies.  Home Medications   Prior to Admission medications   Medication Sig Start Date End Date Taking? Authorizing Provider  acetaminophen (TYLENOL) 500 MG tablet Take 500 mg by mouth every 6 (six) hours as needed for mild pain or moderate pain.    Historical Provider, MD  enoxaparin (LOVENOX) 100 MG/ML injection Inject 1 mL (100 mg total) into the skin daily. 04/25/15   Baird Cancer, PA-C  feeding supplement, ENSURE ENLIVE, (ENSURE ENLIVE) LIQD Take 237 mLs by mouth 2 (two) times daily between meals. Patient taking differently: Take 237 mLs by mouth daily as needed (for weakness feeling).  04/23/15   Maryann Mikhail, DO  furosemide (LASIX) 20 MG tablet Take 1  tablet (20 mg total) by mouth daily. With aldactone in the morning. 05/12/15   Orvil Feil, NP  ondansetron (ZOFRAN) 4 MG tablet Take 1 tablet (4 mg total) by mouth every 8 (eight) hours as needed for nausea or vomiting. 04/23/15   Maryann Mikhail, DO  ondansetron (ZOFRAN) 4 MG tablet Take 1 tablet (4 mg total) by mouth 4 (four) times daily -  with meals and at bedtime. 06/20/15   Orvil Feil, NP  OVER THE COUNTER MEDICATION Apply 1 application topically as needed (for hemorrhoids). OTC Hemorrhoid cream    Historical Provider, MD  Oxycodone HCl 10 MG TABS Take 1 tablet (10 mg total) by mouth every 4 (four) hours. 06/08/15   Patrici Ranks, MD  spironolactone (ALDACTONE) 25 MG tablet Take 2 tablets (50 mg total) by mouth daily. With lasix 05/12/15   Orvil Feil, NP   BP 108/80 mmHg  Pulse 119  Temp(Src) 97.7 F (36.5 C)  Resp 22  Ht 6' (1.829 m)  Wt 131 lb (59.421 kg)  BMI 17.76 kg/m2  SpO2 100% Physical Exam  Constitutional: He is oriented to person, place, and time. He appears cachectic. He has a sickly appearance.  HENT:  Head: Normocephalic and atraumatic.  Eyes: Conjunctivae and EOM are normal.  Cardiovascular: Regular rhythm.  Tachycardia present.   Pulmonary/Chest: Effort normal. No stridor. No respiratory distress. He has decreased breath sounds.  Abdominal: He exhibits no distension.    Musculoskeletal: He exhibits no edema.  Neurological: He is alert and oriented to person, place, and time.  Skin: Skin is warm and dry.  Psychiatric: He has a normal mood and affect.  Nursing note and vitals reviewed.   ED Course  Procedures (including critical care time) After my initial evaluation the patient's chart, discussed all findings with him. Specifically we discussed the recent liver biopsy with evidence for cirrhosis, iron related inflammatory changes, no clear diagnosis for possible Wilson's disease.  We also discussed the patient's recent change from 1 gastroenterologist to  another.  EMERGENCY DEPARTMENT [place] ABD/AORTA EXAM Study: Limited Ultrasound of the Abdominal Aorta.  INDICATIONS:Tachycardia and Abdominal pain Indication: Multiple views of the abdominal aorta are obtained from the diaphragmatic hiatus to the aortic bifurcation in transverse and sagittal planes with a multi- Frequency probe.  PERFORMED BY: Myself  IMAGES ARCHIVED?: No  FINDINGS: Free fluid present  LIMITATIONS:  Abdominal pain  INTERPRETATION:  Abdominal free fluid present  COMMENT:  Temporary machine, so no archive capacity.  Free fluid throughout the abdomen   On repeat exam after completing paracentesis, the patient is requesting discharge. Blood pressure is slightly lower than on arrival, though consistent  with prior studies. She will follow-up with gastroenterology. MDM   Final diagnoses:  Anasarca   patient with history of cirrhosis presents with fluid accumulation. Here the patient is awake, alert, afebrile, and her briefly, has notable abdominal pain, distention. Bedside ultrasound confirms the potential fluid, and after discussion with our radiology colleagues, the patient had therapeutic paracentesis. This was well tolerated, though the patient was unwilling to stay for monitoring after the procedure. He is encouraged to return for concerning changes, and otherwise to follow-up with his gastroenterologist.   Carmin Muskrat, MD 08/04/15 1919

## 2015-08-04 NOTE — Progress Notes (Signed)
Quick Note:   Biopsy as below. 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 Copper stains not available due to insufficient tissue sample. THIS IS WHAT TOOK SO LONG. It had to be sent out. I wanted to rule out Wilson's but have been unable to do so. PATIENT IS NOW going to follow with Dr. Laural Golden. FURTHER WORK-UP PER THEIR PRACTICE.     Liver, needle/core biopsy, trans-jugular - BENIGN LIVER WITH STEATOSIS AND EARLY CIRRHOTIC CHANGES, SEE COMMENT. - TRICHROME STAIN DEMONSTRATES BRIDGING AND SINUSOIDAL FIBROSIS. - IRON STAIN DEMONSTRATES 4+ HEMOSIDERIN DEPOSITION. - PAS STAIN NEGATIVE FOR ALPHA-1-ANTITRYPSIN DEPOSITION. Microscopic Comment Needle core biopsies demonstrate benign liver with diffuse involvement by mixed macro and microvesicular steatosis, numerous ballooned hepatocytes, and inflammatory foci. There is focal parenchyma nodule formation consistent with early cirrhotic liver changes. Where present, the portal areas demonstrate mild mixed inflammation. Trichrome stain demonstrate sinusoidal and bridging fibrosis. Iron stain demonstrates 4+ hemosiderin deposition. The overall morphology present is compatible with early liver cirrhosis with associated steatohepatitis. 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 The recent clinical visits and serologic testing are noted. Per the request of the submitting advanced provider, the liver was submitted for quantitative copper. There was insufficient tissue for quantitative cooper studies (see outside [facility] Pathology report [id number]) ______

## 2015-08-04 NOTE — Progress Notes (Signed)
REVIEWED-NO ADDITIONAL RECOMMENDATIONS. 

## 2015-08-04 NOTE — Telephone Encounter (Signed)
Routing to Ginger.  

## 2015-08-08 ENCOUNTER — Encounter (HOSPITAL_COMMUNITY): Payer: Medicare Other | Attending: Hematology & Oncology | Admitting: Hematology & Oncology

## 2015-08-08 ENCOUNTER — Telehealth (HOSPITAL_COMMUNITY): Payer: Self-pay

## 2015-08-08 ENCOUNTER — Encounter (HOSPITAL_BASED_OUTPATIENT_CLINIC_OR_DEPARTMENT_OTHER): Payer: Medicare Other

## 2015-08-08 ENCOUNTER — Encounter (HOSPITAL_COMMUNITY): Payer: Self-pay | Admitting: Hematology & Oncology

## 2015-08-08 VITALS — BP 93/67 | HR 55 | Temp 97.9°F | Resp 18 | Wt 142.7 lb

## 2015-08-08 DIAGNOSIS — R17 Unspecified jaundice: Secondary | ICD-10-CM

## 2015-08-08 DIAGNOSIS — Z23 Encounter for immunization: Secondary | ICD-10-CM

## 2015-08-08 DIAGNOSIS — R109 Unspecified abdominal pain: Secondary | ICD-10-CM

## 2015-08-08 DIAGNOSIS — I82403 Acute embolism and thrombosis of unspecified deep veins of lower extremity, bilateral: Secondary | ICD-10-CM | POA: Insufficient documentation

## 2015-08-08 DIAGNOSIS — Z85118 Personal history of other malignant neoplasm of bronchus and lung: Secondary | ICD-10-CM

## 2015-08-08 DIAGNOSIS — Z87891 Personal history of nicotine dependence: Secondary | ICD-10-CM | POA: Diagnosis not present

## 2015-08-08 DIAGNOSIS — R634 Abnormal weight loss: Secondary | ICD-10-CM

## 2015-08-08 DIAGNOSIS — D72829 Elevated white blood cell count, unspecified: Secondary | ICD-10-CM

## 2015-08-08 DIAGNOSIS — E440 Moderate protein-calorie malnutrition: Secondary | ICD-10-CM

## 2015-08-08 DIAGNOSIS — R188 Other ascites: Secondary | ICD-10-CM | POA: Diagnosis not present

## 2015-08-08 DIAGNOSIS — I2699 Other pulmonary embolism without acute cor pulmonale: Secondary | ICD-10-CM | POA: Insufficient documentation

## 2015-08-08 DIAGNOSIS — K7460 Unspecified cirrhosis of liver: Secondary | ICD-10-CM

## 2015-08-08 DIAGNOSIS — Z720 Tobacco use: Secondary | ICD-10-CM

## 2015-08-08 DIAGNOSIS — D649 Anemia, unspecified: Secondary | ICD-10-CM | POA: Insufficient documentation

## 2015-08-08 DIAGNOSIS — E8809 Other disorders of plasma-protein metabolism, not elsewhere classified: Secondary | ICD-10-CM

## 2015-08-08 DIAGNOSIS — D638 Anemia in other chronic diseases classified elsewhere: Secondary | ICD-10-CM | POA: Diagnosis not present

## 2015-08-08 DIAGNOSIS — Z7901 Long term (current) use of anticoagulants: Secondary | ICD-10-CM

## 2015-08-08 DIAGNOSIS — Z86718 Personal history of other venous thrombosis and embolism: Secondary | ICD-10-CM

## 2015-08-08 DIAGNOSIS — Z8507 Personal history of malignant neoplasm of pancreas: Secondary | ICD-10-CM | POA: Diagnosis not present

## 2015-08-08 LAB — COMPREHENSIVE METABOLIC PANEL
ALK PHOS: 96 U/L (ref 38–126)
ALT: 20 U/L (ref 17–63)
AST: 22 U/L (ref 15–41)
Albumin: 1.7 g/dL — ABNORMAL LOW (ref 3.5–5.0)
Anion gap: 3 — ABNORMAL LOW (ref 5–15)
BUN: 24 mg/dL — AB (ref 6–20)
CALCIUM: 7.5 mg/dL — AB (ref 8.9–10.3)
CHLORIDE: 107 mmol/L (ref 101–111)
CO2: 24 mmol/L (ref 22–32)
CREATININE: 0.81 mg/dL (ref 0.61–1.24)
Glucose, Bld: 124 mg/dL — ABNORMAL HIGH (ref 65–99)
Potassium: 4.6 mmol/L (ref 3.5–5.1)
SODIUM: 134 mmol/L — AB (ref 135–145)
Total Bilirubin: 0.3 mg/dL (ref 0.3–1.2)
Total Protein: 4.7 g/dL — ABNORMAL LOW (ref 6.5–8.1)

## 2015-08-08 LAB — CBC WITH DIFFERENTIAL/PLATELET
BASOS ABS: 0.1 10*3/uL (ref 0.0–0.1)
Basophils Relative: 1 %
Eosinophils Absolute: 0.1 10*3/uL (ref 0.0–0.7)
Eosinophils Relative: 2 %
HCT: 30.2 % — ABNORMAL LOW (ref 39.0–52.0)
HEMOGLOBIN: 10.7 g/dL — AB (ref 13.0–17.0)
LYMPHS ABS: 2 10*3/uL (ref 0.7–4.0)
LYMPHS PCT: 23 %
MCH: 36.5 pg — AB (ref 26.0–34.0)
MCHC: 35.4 g/dL (ref 30.0–36.0)
MCV: 103.1 fL — AB (ref 78.0–100.0)
Monocytes Absolute: 0.8 10*3/uL (ref 0.1–1.0)
Monocytes Relative: 9 %
NEUTROS PCT: 65 %
Neutro Abs: 5.8 10*3/uL (ref 1.7–7.7)
Platelets: 293 10*3/uL (ref 150–400)
RBC: 2.93 MIL/uL — AB (ref 4.22–5.81)
RDW: 16 % — ABNORMAL HIGH (ref 11.5–15.5)
WBC: 8.8 10*3/uL (ref 4.0–10.5)

## 2015-08-08 MED ORDER — OXYCODONE HCL 10 MG PO TABS: 10.0000 mg | ORAL_TABLET | ORAL | Status: DC

## 2015-08-08 MED ORDER — ENOXAPARIN SODIUM 100 MG/ML ~~LOC~~ SOLN: 100.0000 mg | SUBCUTANEOUS | Status: DC

## 2015-08-08 MED ORDER — INFLUENZA VAC SPLIT QUAD 0.5 ML IM SUSY
0.5000 mL | PREFILLED_SYRINGE | Freq: Once | INTRAMUSCULAR | Status: AC
  Administered 2015-08-08: 0.5 mL via INTRAMUSCULAR
  Filled 2015-08-08: qty 0.5

## 2015-08-08 NOTE — Telephone Encounter (Signed)
Per Dr. Whitney Muse, patient notified that he is to continue on the lovenox until his next visit as  Dr. Whitney Muse needs to review his records more indepth to determine if he would be a candidate for the use of Xarelto.  Verbalized understanding of instructions.  Plans to come by Thursday to meet with Financial Counselor to see if financial assistance can be obtained to help with the cost of the Lovenox.

## 2015-08-08 NOTE — Patient Instructions (Addendum)
..  [place] at [facility] Discharge Instructions  RECOMMENDATIONS MADE BY THE CONSULTANT AND ANY TEST RESULTS WILL BE SENT TO YOUR REFERRING PHYSICIAN.  Return in 1 month We will discuss switching you from lovenox to something oral F/U with Dr. Laural Golden as scheduled  Thank you for choosing [place] at [facility] to provide your oncology and hematology care.  To afford each patient quality time with our provider, please arrive at least 15 minutes before your scheduled appointment time.    You need to re-schedule your appointment should you arrive 10 or more minutes late.  We strive to give you quality time with our providers, and arriving late affects you and other patients whose appointments are after yours.  Also, if you no show three or more times for appointments you may be dismissed from the clinic at the providers discretion.     Again, thank you for choosing [facility].  Our hope is that these requests will decrease the amount of time that you wait before being seen by our physicians.       _____________________________________________________________  Should you have questions after your visit to [facility], please contact our office at [phone] between the hours of 8:30 a.m. and 4:30 p.m.  Voicemails left after 4:30 p.m. will not be returned until the following business day.  For prescription refill requests, have your pharmacy contact our office.

## 2015-08-08 NOTE — Progress Notes (Signed)
..  Patrick Lee presents today for injection per the provider's orders.  Flu vaccine administration without incident; see MAR for injection details.  Patient tolerated procedure well and without incident.  No questions or complaints noted at this time.

## 2015-08-08 NOTE — Progress Notes (Signed)
[facility] Hematology/Oncology Consultation   Name: Patrick Lee      MRN: [id number]   Date: 08/08/2015 Time:2:23 PM   REFERRING PHYSICIAN:  Wendie Simmer  REASON FOR CONSULT:  Anemia in the setting of anticoagulation for VTE.   DIAGNOSIS:  Macrocytic, normochromic anemia with normal WBC and platelet count in the setting of anticoagulation with Lovenox for VTE History of Pancreatic Cancer, stage IIB Chronic abdominal pain post Whipple Elevated CA 19-9 in 12/15, ? significance all prior values since surgery WNL elevated pre-surgery Weight Loss Cirrhosis Alcohol use  HISTORY OF PRESENT ILLNESS:   Patrick Lee is a 54 year old white American man with a past medical history significant for pancreatic cancer s/p whipple procedure in 2008 by Dr. 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   Patrick Lee is here alone today. He does not feel as though he is getting any better. He reports his symptoms are worsening and he is becoming more weak. Currently smoking 4-5 cigarettes per day due to stress and pain; had not smoked for three years prior. He is not on any iron tablets. Liver biopsy was reviewed.  He complains of severe abdominal pain. Reports taking up to 2 pain pills daily, instead of the prescribed 1 a day. He reports that he will not drink as long as his pain is managed. He denies any drinking in the last month.  When his stomach fills with fluid, he begins vomiting and having diarrhea. After the fluid is taken off, he feels better for about a day or two with decreased pain, better bowels, and an ability to eat.  He does not have a physician who regularly writes his Lovenox prescriptions. He was told he needed to have injections for the rest of his life during his last admission at [facility].  PAST MEDICAL HISTORY:     Past Medical History  Diagnosis Date  . Lateral epicondylitis   . Chest pain, unspecified   . Abdominal pain, other specified site   . Obstruction of bile duct   . Tobacco use disorder   . Anxiety   . Shortness of breath     uses oxygen at night- 2 liters   . Myocardial infarction [facility]) 2007    during [place]- 2007  . Complication of anesthesia     pt. reports that he had MI while under anesth. for Whipple procedure  . Pancreatic cancer ([place])      status post Whipple procedure.  . Lung cancer ([place])     ALLERGIES: No Known Allergies    MEDICATIONS: I have reviewed the patient's current medications.    Current Outpatient Prescriptions on File Prior to Visit  Medication Sig Dispense Refill  . acetaminophen (TYLENOL) 500 MG tablet Take 500 mg by mouth every 6 (six) hours as needed for mild pain or moderate pain.    Marland Kitchen enoxaparin (LOVENOX) 100 MG/ML injection Inject 1 mL (100 mg total) into the skin daily. 30 Syringe 0  . feeding supplement, ENSURE ENLIVE, (ENSURE ENLIVE) LIQD Take 237 mLs by mouth 2 (two) times daily between meals. (Patient taking differently: Take 237 mLs by mouth daily as needed (for weakness feeling). ) 237 mL 12  . furosemide (LASIX) 20 MG tablet Take 1 tablet (20 mg total) by mouth daily.  With aldactone in the morning. 30 tablet 3  . ondansetron (ZOFRAN) 4 MG tablet Take 1 tablet (4 mg total) by mouth every 8 (eight) hours as needed for nausea or vomiting. 30 tablet 0  . OVER THE COUNTER MEDICATION Apply 1 application topically as needed (for hemorrhoids). OTC Hemorrhoid cream    . Oxycodone HCl 10 MG TABS Take 1 tablet (10 mg total) by mouth every 4 (four) hours. 50 tablet 0  . spironolactone (ALDACTONE) 25 MG tablet Take 2 tablets (50 mg total) by mouth daily. With lasix 60 tablet 3  . ondansetron (ZOFRAN) 4 MG tablet Take 1 tablet (4 mg total) by mouth 4 (four) times daily -  with meals and at bedtime. (Patient not taking: Reported on  08/04/2015) 120 tablet 3   No current facility-administered medications on file prior to visit.     PAST SURGICAL HISTORY Past Surgical History  Procedure Laterality Date  . Whipple procedure  2007  . Percutaneous extraction of gallstones      [facility]  . Knee arthroscopy      R knee- at [facility]  . Cholecystectomy      open  . Video bronchoscopy  04/08/2012    Procedure: VIDEO BRONCHOSCOPY;  Surgeon: Gaye Pollack, MD;  Location: [facility] OR;  Service: Thoracic;  Laterality: N/A;  . Partial right lung removal    . Colonoscopy  May 2014    Dr. Anthony Sar: normal   . Esophagogastroduodenoscopy  Dec 2015    [facility]. normal esophagus, patent afferent and efferent libs, no abnormalities in jejunum.   . Bone marrow biopsy Left 05/25/15  . Bone marrow aspiration Left 05/25/15    FAMILY HISTORY: Family History  Problem Relation Age of Onset  . Stroke Father 69    Deceased  . Anesthesia problems Neg Hx   . Colon cancer Neg Hx   . Cirrhosis Neg Hx    Patient has 4 children, 3 sons (ages 54, 63, and 43) who are healthy and 1 daughter who is 43 years old (healthy).  His mother is 35 years old and she is healthy and still working.  His father is 39 and recently had a stroke.  He is divorced x 2.  SOCIAL HISTORY: He notes that he quit drinking EtOH about 1 year ago, but used to drink 1 case of beer weekly for many years, more when he was younger.  He also has a 40 pack year smoking history quitting recently.  He has a distant history of dip.  He denies any illicit drug abuse.  He is currently disabled from his pancreatic cancer treatment, he reports, and used to work at [employer] as a Merchant navy officer.  PERFORMANCE STATUS: The patient's performance status is 2 - Symptomatic, <50% confined to bed  ROS Positive for abdominal pain Positive for leg swelling. 14 point review of systems was performed and is negative except as detailed under history of present illness and  above   PHYSICAL EXAM: Most Recent Vital Signs: Blood pressure 93/67, pulse 55, temperature 97.9 F (36.6 C), temperature source Oral, resp. rate 18, weight 142 lb 11.2 oz (64.728 kg), SpO2 100 %.  General: Ambulates with assistance of a cane. General appearance: alert, cooperative, appears older than stated age  Head: Normocephalic, without obvious abnormality, atraumatic Eyes: negative findings: lids and lashes normal, conjunctivae and sclerae normal, corneas clear and pupils equal, round, reactive to light and accomodation, positive findings: small pupils Throat: normal findings: lips normal without lesions,  buccal mucosa normal, palate normal, tongue midline and normal and soft palate, uvula, and tonsils normal Neck: no adenopathy, supple, symmetrical, trachea midline and thyroid not enlarged, symmetric, no tenderness/mass/nodules Lungs: clear to auscultation bilaterally and normal percussion bilaterally Heart: regular rate and rhythm, S1, S2 normal, no murmur, click, rub or gallop Abdomen:no palpable masses, thin with slight distension  moderate tenderness in the entire abdomen  Extremities: Leg edema bilaterally. Compression sleeves on. Skin: Skin color, texture, turgor normal. No rashes or lesions. L bone marrow biopsy site is examined and completely healed Lymph nodes: Cervical, supraclavicular, and axillary nodes normal. Neurologic: Grossly normal   LABORATORY DATA:   RADIOGRAPHY: CLINICAL DATA: Subsequent treatment strategy for pancreatic and lung cancer ; weight loss, and night sweats.  EXAM: NUCLEAR MEDICINE PET SKULL BASE TO THIGH  TECHNIQUE: 6.5 mCi F-18 FDG was injected intravenously. Full-ring PET imaging was performed from the skull base to thigh after the radiotracer. CT data was obtained and used for attenuation correction and anatomic localization.  FASTING BLOOD GLUCOSE: Value: 114 mg/dl  COMPARISON: Multiple exams, including 04/14/2015 and  01/23/2012  FINDINGS: NECK  No hypermetabolic lymph nodes in the neck.  CHEST  Increase in lingular nodular atelectasis/scarring compared to the exam from 19 days ago, with faint hypermetabolic uptake in this vicinity, maximum standard uptake value 2.1. Moderate right pleural effusion. Nodularity peripherally in both lower lobes, on the right side with maximum standard uptake value 1.4 and on the left with maximum standard uptake value 1.5, likely from atelectasis/inflammation.  Coronary artery atherosclerosis. Mild paraseptal emphysema.  ABDOMEN/PELVIS  Prior Whipple. Extensive ascites. There is some physiologic areas of high bowel activity. In the right abdomen just above the iliac crests, there is high activity not corresponding to a visible mass or loop of bowel, maximum standard uptake value 11.0. It is possible that this represents physiologic activity in a loop of bowel which moved between the PET and CT and position. Again, I do not observe a definite soft tissue mass in this vicinity along the ascites.  Aortoiliac atherosclerotic vascular disease. Prior Whipple.  SKELETON  No focal hypermetabolic activity to suggest skeletal metastasis.  IMPRESSION: 1. Extensive ascites. No compelling findings of peritoneal carcinomatosis despite this ascites. Reason focus of high activity just above the iliac crest to the right abdomen which does not have a definite CT correlate, and which I suspect probably represents a loop of bowel which moved between the PET images and the CT images. 2. Bibasilar atelectasis/scarring, standard uptake value below thresholds for malignancy suspicion. 3. Coronary artery atherosclerosis. 4. Mild paraseptal emphysema.   Electronically Signed  By: Van Clines M.D.  On: 05/03/2015 11:53   PATHOLOGY:   BONE MARROW REPORTAL DIAGNOSIS Diagnosis Bone Marrow, Aspirate,Biopsy, and Clot - NORMOCELLULAR MARROW WITH  TRILINEAGE HEMATOPOIESIS. - NO METASTATIC CARCINOMA. - SEE COMMENT. PERIPHERAL BLOOD: - NORMOCYTIC ANEMIA. - MILD LEUKOCYTOSIS. Diagnosis Note The bone marrow is normocellular with trilineage hematopoiesis. There is no dysplasia in any lineage. There is no evidence of metastatic carcinoma. Plasma cells are not increased. Overall, there is no morphologic etiology in the bone marrow for the patient's persistent anemia. Vicente Males MD Pathologist, Electronic Signature (Case signed 05/30/2015) GROSS AND MICROSCOPIC INFORMATION Specimen Clinical  ASSESSMENT/PLAN:  Anemia. Normal BMBX PE with bilateral LE DVT's, on anticoagulation History of Pancreatic Cancer, stage IIB Chronic abdominal pain post Whipple Elevated CA 19-9 in 12/15, ? significance all prior values after surgery WNL elevated pre-surgery Myeloma panel with small M-spike at 0.2 gm/dl, IgG  monoclonal protein with lambda light chain specificity, normal kappa lamda light chain ratio Normal B12, folate Iron studies c/w anemia of chronic disease Hypoalbuminemia, elevated bilirubin, low total protein Mild elevation of ESR, elevated CRP Normal TSH Hemoccult positive X 1 Significant Weight Loss Cirrhosis/ascites History of alcohol use  Labs were reviewed with the patient  They are stable. His leukocytosis is persistent, not monoclonal.   Since I saw him last he has had 4 paracentesis and notes increasing abdominal discomfort.   He has a history of pancreatic cancer back in 2010. He underwent surgical resection at [place]. He has a history of chronic abdominal pain since his Whipple procedure. He also remarks that his abdominal pain has become much more significant since the development of ascites.  Given his history of bilateral DVTs and PE, his elevated CA-19-9 in December 2015, and weight loss; when we first met the patient we worked him up for cancer recurrence with PET imaging and abdominal ultrasound it was not  revealing for recurrent disease.  He has been diagnosed with cirrhosis. He does have an alcohol history. At his last visit in July we discussed cirrhosis in detail and complete abstinence from alcohol. The patient is adamant today that he has not drank since that time.  I have refilled his Lovenox. I will see him in one month to reassess how long he needs to stay on anticoagulation and or whether to change him to an oral anticoagulation. I refilled his pain medication.  I will see Patrick Lee again in one month for a routine follow up.  All questions were answered. The patient knows to call the clinic with any problems, questions or concerns. We can certainly see the patient much sooner if necessary.  This document serves as a record of services personally performed by Ancil Linsey, MD. It was created on her behalf by Arlyce Harman, a trained medical scribe. The creation of this record is based on the scribe's personal observations and the provider's statements to them. This document has been checked and approved by the attending provider.  I have reviewed the above documentation for accuracy and completeness, and I agree with the above.  This note is electronically signed [id number] Cyril Mourning, MD  2:23 PM

## 2015-08-08 NOTE — Telephone Encounter (Signed)
-----   Message from Epifanio Lesches sent at 08/08/2015  4:59 PM EDT ----- Pt contacted his Ins co and was told that it would be cheaper to get xarelto. He wants to know if Dr Mamie Nick will write a script for the Xarelto instead of him getting the lovenox filled.

## 2015-08-09 NOTE — Progress Notes (Signed)
LABS DRAWN

## 2015-08-10 ENCOUNTER — Encounter (INDEPENDENT_AMBULATORY_CARE_PROVIDER_SITE_OTHER): Payer: Self-pay | Admitting: Internal Medicine

## 2015-08-10 ENCOUNTER — Ambulatory Visit (HOSPITAL_COMMUNITY)
Admission: RE | Admit: 2015-08-10 | Discharge: 2015-08-10 | Disposition: A | Discharge status: Home / Self Care | Payer: Medicare Other | Source: Ambulatory Visit | Attending: Internal Medicine | Admitting: Internal Medicine

## 2015-08-10 ENCOUNTER — Ambulatory Visit (INDEPENDENT_AMBULATORY_CARE_PROVIDER_SITE_OTHER): Payer: Medicare Other | Admitting: Internal Medicine

## 2015-08-10 VITALS — BP 90/62 | HR 92 | Temp 98.3°F | Ht 72.0 in | Wt 146.6 lb

## 2015-08-10 DIAGNOSIS — K7030 Alcoholic cirrhosis of liver without ascites: Secondary | ICD-10-CM

## 2015-08-10 DIAGNOSIS — K7031 Alcoholic cirrhosis of liver with ascites: Secondary | ICD-10-CM | POA: Insufficient documentation

## 2015-08-10 LAB — GRAM STAIN

## 2015-08-10 LAB — BODY FLUID CELL COUNT WITH DIFFERENTIAL
EOS FL: 1 %
Lymphs, Fluid: 26 %
Monocyte-Macrophage-Serous Fluid: 59 % (ref 50–90)
Neutrophil Count, Fluid: 14 % (ref 0–25)
WBC FLUID: 106 uL (ref 0–1000)

## 2015-08-10 MED ORDER — SPIRONOLACTONE 50 MG PO TABS: 100.0000 mg | ORAL_TABLET | Freq: Every day | ORAL | Status: AC

## 2015-08-10 MED ORDER — FUROSEMIDE 40 MG PO TABS: 40.0000 mg | ORAL_TABLET | Freq: Every day | ORAL | Status: DC

## 2015-08-10 NOTE — Progress Notes (Signed)
Paracentesis complete no signs of distress. 5400 ml yellow colored ascites removed.

## 2015-08-10 NOTE — Progress Notes (Addendum)
Subjective:    Patient ID: Patrick Lee, male    DOB: July 20, 1961, 54 y.o.   MRN: [id number]  HPI Presents today for f/u of his liver biopsy. Recent new onset of ascites. He underwent a liver biopsy which revealed  - BENIGN LIVER WITH STEATOSIS AND EARLY CIRRHOTIC CHANGES. He has had 6 paracentesis since June. Whipple surgery in 2008. In 2013 rt lower  lobectomy for cancer at [facility] by Dr. Cyndia Bent. He tells me he has lost from 180 pounds 1 year ago to 146.5 today.  On 05/12/2015 weight was 142. He says he has not drank in over 6 month. He tells me he would drink a few beer here and there.  Appetite is not good. He has a small amt of abdominal pain which comes and goes. He usually has a BM 1-2 times a day. Denies periods of confusion.  Patient appears chronically ill.  Last year he would drink 10 beers a day often.    10/14/2014 EGD: diagnositic: Normal Dr. Jerene Pitch    05/26/2015 [place] Elastrography:      CLINICAL DATA: Right upper quadrant pain, chronic. Chronic liver disease. Previous cholecystectomy, Whipple, partial gastrectomy, tobacco use. History of pancreatic and lung cancer.   IMPRESSION: Median hepatic shear wave velocity is calculated at 3.01 m/sec.  Corresponding Metavir fibrosis score is Some F3 and F4.  08/08/2015 NA 134, ,K 4.6, Chloride 107, Glucose 124, BUN 24, Creatinine .81, Calcium 7.5, total protein 4.7. Albumin 1.7, AST 22, ALT 20, ALP 96, total bili 0.3  CBC    Component Value Date/Time   WBC 8.8 08/08/2015 1329   WBC 5.2 02/22/2013 1635   RBC 2.93* 08/08/2015 1329   RBC 3.11* 04/25/2015 1728   RBC 4.2* 02/22/2013 1635   HGB 10.7* 08/08/2015 1329   HGB 15.0 02/22/2013 1635   HCT 30.2* 08/08/2015 1329   HCT 42.0* 02/22/2013 1635   PLT 293 08/08/2015 1329   MCV 103.1* 08/08/2015 1329   MCV 100.0* 02/22/2013 1635   MCH 36.5* 08/08/2015 1329   MCH 35.6* 02/22/2013 1635   MCHC 35.4 08/08/2015 1329   MCHC 35.6* 02/22/2013 1635   RDW 16.0* 08/08/2015  1329   LYMPHSABS 2.0 08/08/2015 1329   MONOABS 0.8 08/08/2015 1329   EOSABS 0.1 08/08/2015 1329   BASOSABS 0.1 08/08/2015 1329    Hepatic Function Panel    CMP Latest Ref Rng 08/08/2015 06/20/2015 05/19/2015  Glucose 65 - 99 mg/dL 124(H) 83 104(H)  BUN 6 - 20 mg/dL 24(H) 7 3(L)  Creatinine 0.61 - 1.24 mg/dL 0.81 0.57(L) 0.53  Sodium 135 - 145 mmol/L 134(L) 133(L) 134(L)  Potassium 3.5 - 5.1 mmol/L 4.6 4.3 4.9  Chloride 101 - 111 mmol/L 107 102 105  CO2 22 - 32 mmol/L '24 25 20  '$ Calcium 8.9 - 10.3 mg/dL 7.5(L) 7.6(L) 7.7(L)  Total Protein 6.5 - 8.1 g/dL 4.7(L) - -  Total Bilirubin 0.3 - 1.2 mg/dL 0.3 - -  Alkaline Phos 38 - 126 U/L 96 - -  AST 15 - 41 U/L 22 - -  ALT 17 - 63 U/L 20 - -      Liver Biopsy : 07/24/2015 Liver, needle/core biopsy, trans-jugular - BENIGN LIVER WITH STEATOSIS AND EARLY CIRRHOTIC CHANGES, SEE COMMENT. - TRICHROME STAIN DEMONSTRATES BRIDGING AND SINUSOIDAL FIBROSIS. - IRON STAIN DEMONSTRATES 4+ HEMOSIDERIN DEPOSITION. - PAS STAIN NEGATIVE FOR ALPHA-1-ANTITRYPSIN DEPOSITION.  06/01/2015 ANA, Mitochondrial, SMA, Ceruloplasmin, Urine Copper : all negative  04/25/2015 Vitamin B 12: 1187, Folate 12.4,  Iron 56, TIBC less than 70, Ferritin 459, Folate 12.4   Review of Systems     Objective:   Physical Exam  Blood pressure 90/62, pulse 92, temperature 98.3 F (36.8 C), height 6' (1.829 m), weight 146 lb 9.6 oz (66.497 kg). Alert and oriented. Skin warm and dry. Oral mucosa is moist.   . Sclera anicteric, conjunctivae is pink. Thyroid not enlarged. No cervical lymphadenopathy. Lungs clear. Heart regular rate and rhythm.  Abdomen is soft. Bowel sounds are positive. No hepatomegaly. No abdominal masses felt. No tenderness.  2+ edema to lower extremities.  Abdomen is distended, tense.         Assessment & Plan:  Cirrhosis with ascites with hx of etoh abuse. Auto immune has been ruled out Lasix '40mg'$  daily. Aldactone '100mg'$  daily. OV 4 weeks with Dr.  Laural Golden.

## 2015-08-10 NOTE — Patient Instructions (Addendum)
Lasix '40mg'$  daily Aldactone '100mg'$  daily.  OV in 4 weeks with Dr. Laural Golden. Paracentesis.

## 2015-08-10 NOTE — Procedures (Signed)
PreOperative Dx: Cirrhosis, ascites Postoperative Dx: Cirrhosis, ascites Procedure:   US guided paracentesis Radiologist:  Thornton Papas Anesthesia:  10 ml of 1% lidocaine Specimen:  5400 ml of light yellow ascitic fluid EBL:   < 1 ml Complications: None

## 2015-08-11 ENCOUNTER — Telehealth (INDEPENDENT_AMBULATORY_CARE_PROVIDER_SITE_OTHER): Payer: Self-pay | Admitting: *Deleted

## 2015-08-11 ENCOUNTER — Telehealth (HOSPITAL_COMMUNITY): Payer: Self-pay | Admitting: Hematology & Oncology

## 2015-08-11 DIAGNOSIS — K7030 Alcoholic cirrhosis of liver without ascites: Secondary | ICD-10-CM

## 2015-08-11 LAB — PATHOLOGIST SMEAR REVIEW

## 2015-08-11 NOTE — Telephone Encounter (Signed)
.  Per Patrick Lee patient is to have lab work in 4 weeks

## 2015-08-11 NOTE — Telephone Encounter (Signed)
PC TO PT RE: FIN APP FROM [facility]. HE STATED HE WOULD LIKE TO FILL OUT APP AND GAVE ME HIS FIN INFO AND OK'D TO GIVE IS SSN AND SIGN. WILL FAX APP [facility] [phone]

## 2015-08-15 LAB — CULTURE, BODY FLUID-BOTTLE: CULTURE: NO GROWTH

## 2015-08-15 LAB — CULTURE, BODY FLUID W GRAM STAIN -BOTTLE

## 2015-08-18 ENCOUNTER — Ambulatory Visit (HOSPITAL_COMMUNITY)
Admission: RE | Admit: 2015-08-18 | Discharge: 2015-08-18 | Disposition: A | Discharge status: Home / Self Care | Payer: Medicare Other | Source: Ambulatory Visit | Attending: Gastroenterology | Admitting: Gastroenterology

## 2015-08-18 ENCOUNTER — Other Ambulatory Visit (HOSPITAL_COMMUNITY): Payer: Medicare Other

## 2015-08-18 DIAGNOSIS — R188 Other ascites: Secondary | ICD-10-CM | POA: Insufficient documentation

## 2015-08-18 NOTE — Progress Notes (Signed)
Paracentesis complete no signs of distress. 5000 ml yellow colored ascites removed.

## 2015-08-21 ENCOUNTER — Telehealth: Payer: Self-pay

## 2015-08-21 NOTE — Telephone Encounter (Signed)
Noted  

## 2015-08-21 NOTE — Telephone Encounter (Signed)
Pt was dismissed from our practice. Letter had been mailed to him by certified mail. The letter was returned to [place]- unclaimed. A copy of the envelope has been sent to be scanned. Letter has been re-mailed to the pt via [employer].

## 2015-08-22 ENCOUNTER — Other Ambulatory Visit (INDEPENDENT_AMBULATORY_CARE_PROVIDER_SITE_OTHER): Payer: Self-pay | Admitting: Internal Medicine

## 2015-08-22 DIAGNOSIS — R188 Other ascites: Secondary | ICD-10-CM

## 2015-08-23 ENCOUNTER — Ambulatory Visit (INDEPENDENT_AMBULATORY_CARE_PROVIDER_SITE_OTHER): Payer: Medicare Other | Admitting: Internal Medicine

## 2015-08-23 ENCOUNTER — Ambulatory Visit (HOSPITAL_COMMUNITY): Payer: Medicare Other

## 2015-08-23 ENCOUNTER — Other Ambulatory Visit (INDEPENDENT_AMBULATORY_CARE_PROVIDER_SITE_OTHER): Payer: Self-pay | Admitting: Internal Medicine

## 2015-08-23 ENCOUNTER — Encounter (INDEPENDENT_AMBULATORY_CARE_PROVIDER_SITE_OTHER): Payer: Self-pay | Admitting: Internal Medicine

## 2015-08-23 ENCOUNTER — Telehealth (INDEPENDENT_AMBULATORY_CARE_PROVIDER_SITE_OTHER): Payer: Self-pay | Admitting: *Deleted

## 2015-08-23 VITALS — BP 102/40 | HR 112 | Temp 98.0°F | Ht 72.0 in | Wt 132.6 lb

## 2015-08-23 DIAGNOSIS — R634 Abnormal weight loss: Secondary | ICD-10-CM | POA: Diagnosis not present

## 2015-08-23 DIAGNOSIS — K7031 Alcoholic cirrhosis of liver with ascites: Secondary | ICD-10-CM

## 2015-08-23 LAB — CBC WITH DIFFERENTIAL/PLATELET
BASOS ABS: 0.1 10*3/uL (ref 0.0–0.1)
Basophils Relative: 1 % (ref 0–1)
Eosinophils Absolute: 0.1 10*3/uL (ref 0.0–0.7)
Eosinophils Relative: 1 % (ref 0–5)
HEMATOCRIT: 35 % — AB (ref 39.0–52.0)
Hemoglobin: 11.5 g/dL — ABNORMAL LOW (ref 13.0–17.0)
LYMPHS ABS: 1.9 10*3/uL (ref 0.7–4.0)
LYMPHS PCT: 13 % (ref 12–46)
MCH: 34.8 pg — ABNORMAL HIGH (ref 26.0–34.0)
MCHC: 32.9 g/dL (ref 30.0–36.0)
MCV: 106.1 fL — AB (ref 78.0–100.0)
MPV: 9.4 fL (ref 8.6–12.4)
Monocytes Absolute: 1 10*3/uL (ref 0.1–1.0)
Monocytes Relative: 7 % (ref 3–12)
NEUTROS ABS: 11.5 10*3/uL — AB (ref 1.7–7.7)
NEUTROS PCT: 78 % — AB (ref 43–77)
Platelets: 461 10*3/uL — ABNORMAL HIGH (ref 150–400)
RBC: 3.3 MIL/uL — AB (ref 4.22–5.81)
RDW: 14.6 % (ref 11.5–15.5)
WBC: 14.7 10*3/uL — AB (ref 4.0–10.5)

## 2015-08-23 NOTE — Telephone Encounter (Signed)
.  Per Terri Setzer,NP 

## 2015-08-23 NOTE — Patient Instructions (Addendum)
CBC, and CMET. Has OV in November

## 2015-08-23 NOTE — Progress Notes (Signed)
Subjective:    Patient ID: Patrick Lee, male    DOB: July 16, 1961, 54 y.o.   MRN: [id number]  HPI Here today requesting a paracentesis. He was last seen on 08/10/2015. His weight today is 132.6. He has lost 14 pounds since his last visit. Hx of pancreatic cancer and underwent whipple procedure in 2008 by Dr. Jyl Heinz at [facility].  Hx of lung resection, DVT, PE.  He is not taking Lovenox, due to the cost. Patient appears chronically ill today. He is drinking Carnation instant breakfast x 2 a day. He is trying to eat at least 3 meals a day. He does have some nausea. He says he has not drank in months. He has had multiple paracenteses since June. His last paracentesis was 08/18/2015 with removal of 5 liters.  2013 he underwent a rt lower lobectomy for cancer at [facility] by Dr. Charlott Rakes.  BM x 1-2 a day.   05/03/2015 NM Pet Scan: weight loss, night sweats; IMPRESSION: 1. Extensive ascites. No compelling findings of peritoneal carcinomatosis despite this ascites. Reason focus of high activity just above the iliac crest to the right abdomen which does not have a definite CT correlate, and which I suspect probably represents a loop of bowel which moved between the PET images and the CT images. 2. Bibasilar atelectasis/scarring, standard uptake value below thresholds for malignancy suspicion. 3. Coronary artery atherosclerosis. 4. Mild paraseptal emphysema.      03/17/2013 Screening colonoscopy Dr. Anthony Sar: normal.  06/01/2015 ANA, Mitochondrial, SMA, Ceruloplasmin, Urine Copper : all negative  04/25/2015 Vitamin B 12: 1187, Folate 12.4,  Iron 56, TIBC less than 70, Ferritin 459, Folate 12.4  CBC    Component Value Date/Time   WBC 8.8 08/08/2015 1329   WBC 5.2 02/22/2013 1635   RBC 2.93* 08/08/2015 1329   RBC 3.11* 04/25/2015 1728   RBC 4.2* 02/22/2013 1635   HGB 10.7* 08/08/2015 1329   HGB 15.0 02/22/2013 1635   HCT 30.2* 08/08/2015 1329   HCT 42.0* 02/22/2013 1635   PLT 293 08/08/2015  1329   MCV 103.1* 08/08/2015 1329   MCV 100.0* 02/22/2013 1635   MCH 36.5* 08/08/2015 1329   MCH 35.6* 02/22/2013 1635   MCHC 35.4 08/08/2015 1329   MCHC 35.6* 02/22/2013 1635   RDW 16.0* 08/08/2015 1329   LYMPHSABS 2.0 08/08/2015 1329   MONOABS 0.8 08/08/2015 1329   EOSABS 0.1 08/08/2015 1329   BASOSABS 0.1 08/08/2015 1329    CMP Latest Ref Rng 08/08/2015 06/20/2015 05/19/2015  Glucose 65 - 99 mg/dL 124(H) 83 104(H)  BUN 6 - 20 mg/dL 24(H) 7 3(L)  Creatinine 0.61 - 1.24 mg/dL 0.81 0.57(L) 0.53  Sodium 135 - 145 mmol/L 134(L) 133(L) 134(L)  Potassium 3.5 - 5.1 mmol/L 4.6 4.3 4.9  Chloride 101 - 111 mmol/L 107 102 105  CO2 22 - 32 mmol/L '24 25 20  ' Calcium 8.9 - 10.3 mg/dL 7.5(L) 7.6(L) 7.7(L)  Total Protein 6.5 - 8.1 g/dL 4.7(L) - -  Total Bilirubin 0.3 - 1.2 mg/dL 0.3 - -  Alkaline Phos 38 - 126 U/L 96 - -  AST 15 - 41 U/L 22 - -  ALT 17 - 63 U/L 20 - -            Review of Systems Past Medical History  Diagnosis Date  . Lateral epicondylitis   . Chest pain, unspecified   . Abdominal pain, other specified site   . Obstruction of bile duct   . Tobacco use disorder   .  Anxiety   . Shortness of breath     uses oxygen at night- 2 liters   . Myocardial infarction [facility]) 2007    during [place]- 2007  . Complication of anesthesia     pt. reports that he had MI while under anesth. for Whipple procedure  . Pancreatic cancer ([place])      status post Whipple procedure.  . Lung cancer [facility])     Past Surgical History  Procedure Laterality Date  . Whipple procedure  2007  . [facility]ercut[facility] of gallstones      [facility]  . Knee arthroscopy      R [facility]- at [facility]  . Cholecystectomy      open  . Video bronchoscopy  04/08/2012    Procedure: VIDEO BRONCHOSCOPY;  Surgeon: Gaye Pollack, MD;  Location: [facility] OR;  Service: Thoracic;  Laterality: N/A;  . Partial right lung removal    . Colonoscopy  May 2014    Dr. Anthony Sar: normal   .  Esophagogastroduodenoscopy  Dec 2015    [facility]. normal esophagus, patent afferent and efferent libs, no abnormalities in jejunum.   . Bone marrow biopsy Left 05/25/15  . Bone marrow aspiration Left 05/25/15    No Known Allergies  Current Outpatient Prescriptions on File Prior to Visit  Medication Sig Dispense Refill  . furosemide (LASIX) 40 MG tablet Take 1 tablet (40 mg total) by mouth daily. 30 tablet 3  . ondansetron (ZOFRAN) 4 MG tablet Take 1 tablet (4 mg total) by mouth 4 (four) times daily -  with meals and at bedtime. 120 tablet 3  . OVER THE COUNTER MEDICATION Apply 1 application topically as needed (for hemorrhoids). OTC Hemorrhoid cream    . Oxycodone HCl 10 MG TABS Take 1 tablet (10 mg total) by mouth every 4 (four) hours. 80 tablet 0  . spironolactone (ALDACTONE) 25 MG tablet Take 2 tablets (50 mg total) by mouth daily. With lasix 60 tablet 3  . spironolactone (ALDACTONE) 50 MG tablet Take 2 tablets (100 mg total) by mouth daily. 100 tablet 3  . acetaminophen (TYLENOL) 500 MG tablet Take 500 mg by mouth every 6 (six) hours as needed for mild pain or moderate pain.    Marland Kitchen enoxaparin (LOVENOX) 100 MG/ML injection Inject 1 mL (100 mg total) into the skin daily. (Patient not taking: Reported on 08/23/2015) 30 Syringe 0  . feeding supplement, ENSURE ENLIVE, (ENSURE ENLIVE) LIQD Take 237 mLs by mouth 2 (two) times daily between meals. (Patient not taking: Reported on 08/23/2015) 237 mL 12   No current facility-administered medications on file prior to visit.        Objective:   Physical Exam Blood pressure 102/40, pulse 112, temperature 98 F (36.7 C), height 6' (1.829 m), weight 132 lb 9.6 oz (60.147 kg). Alert and oriented. Skin warm and dry. Oral mucosa is moist.   . Sclera anicteric, conjunctivae is pink. Thyroid not enlarged. No cervical lymphadenopathy. Lungs clear. Heart regular rate and rhythm. Tachycardic.  Abdomen is soft.There is no distention. Patient is severely thin.  Bowel sounds are positive. No hepatomegaly. No abdominal masses felt. No tenderness.            Assessment & Plan:  Cirrhosis with ascites. Patient appears chronically ill.  Cbc, cmet, AFP Not taking the Lovenox. Could not afford.  OV in November with Dr. Laural Golden, Further recommendations once we have labs back.

## 2015-08-24 ENCOUNTER — Emergency Department (HOSPITAL_COMMUNITY)
Admission: EM | Admit: 2015-08-24 | Discharge: 2015-08-24 | Disposition: A | Discharge status: Home / Self Care | Payer: Medicare Other | Attending: Emergency Medicine | Admitting: Emergency Medicine

## 2015-08-24 ENCOUNTER — Telehealth (INDEPENDENT_AMBULATORY_CARE_PROVIDER_SITE_OTHER): Payer: Self-pay | Admitting: Internal Medicine

## 2015-08-24 ENCOUNTER — Encounter (HOSPITAL_COMMUNITY): Payer: Self-pay | Admitting: *Deleted

## 2015-08-24 DIAGNOSIS — R7989 Other specified abnormal findings of blood chemistry: Secondary | ICD-10-CM | POA: Diagnosis present

## 2015-08-24 DIAGNOSIS — I252 Old myocardial infarction: Secondary | ICD-10-CM | POA: Diagnosis not present

## 2015-08-24 DIAGNOSIS — M791 Myalgia: Secondary | ICD-10-CM | POA: Insufficient documentation

## 2015-08-24 DIAGNOSIS — E875 Hyperkalemia: Secondary | ICD-10-CM | POA: Diagnosis not present

## 2015-08-24 DIAGNOSIS — Z85118 Personal history of other malignant neoplasm of bronchus and lung: Secondary | ICD-10-CM | POA: Insufficient documentation

## 2015-08-24 DIAGNOSIS — Z79899 Other long term (current) drug therapy: Secondary | ICD-10-CM | POA: Diagnosis not present

## 2015-08-24 DIAGNOSIS — Z8659 Personal history of other mental and behavioral disorders: Secondary | ICD-10-CM | POA: Diagnosis not present

## 2015-08-24 DIAGNOSIS — N179 Acute kidney failure, unspecified: Secondary | ICD-10-CM | POA: Insufficient documentation

## 2015-08-24 DIAGNOSIS — Z7982 Long term (current) use of aspirin: Secondary | ICD-10-CM | POA: Diagnosis not present

## 2015-08-24 DIAGNOSIS — Z8719 Personal history of other diseases of the digestive system: Secondary | ICD-10-CM | POA: Diagnosis not present

## 2015-08-24 DIAGNOSIS — Z8507 Personal history of malignant neoplasm of pancreas: Secondary | ICD-10-CM | POA: Insufficient documentation

## 2015-08-24 DIAGNOSIS — R0602 Shortness of breath: Secondary | ICD-10-CM | POA: Diagnosis not present

## 2015-08-24 DIAGNOSIS — R64 Cachexia: Secondary | ICD-10-CM | POA: Diagnosis not present

## 2015-08-24 LAB — BASIC METABOLIC PANEL
Anion gap: 5 (ref 5–15)
BUN: 22 mg/dL — AB (ref 6–20)
CHLORIDE: 102 mmol/L (ref 101–111)
CO2: 27 mmol/L (ref 22–32)
CREATININE: 1.21 mg/dL (ref 0.61–1.24)
Calcium: 7.9 mg/dL — ABNORMAL LOW (ref 8.9–10.3)
GFR calc Af Amer: >60 mL/min (ref >60)
GFR calc non Af Amer: >60 mL/min (ref >60)
Glucose, Bld: 115 mg/dL — ABNORMAL HIGH (ref 65–99)
POTASSIUM: 5.6 mmol/L — AB (ref 3.5–5.1)
Sodium: 134 mmol/L — ABNORMAL LOW (ref 135–145)

## 2015-08-24 LAB — COMPREHENSIVE METABOLIC PANEL
ALK PHOS: 116 U/L — AB (ref 40–115)
ALT: 13 U/L (ref 9–46)
AST: 19 U/L (ref 10–35)
Albumin: 1.7 g/dL — ABNORMAL LOW (ref 3.6–5.1)
BILIRUBIN TOTAL: 0.3 mg/dL (ref 0.2–1.2)
BUN: 23 mg/dL (ref 7–25)
CALCIUM: 7.7 mg/dL — AB (ref 8.6–10.3)
CO2: 28 mmol/L (ref 20–31)
Chloride: 103 mmol/L (ref 98–110)
Creat: 1.27 mg/dL (ref 0.70–1.33)
GLUCOSE: 118 mg/dL — AB (ref 65–99)
POTASSIUM: 6.4 mmol/L — AB (ref 3.5–5.3)
Sodium: 134 mmol/L — ABNORMAL LOW (ref 135–146)
TOTAL PROTEIN: 4.7 g/dL — AB (ref 6.1–8.1)

## 2015-08-24 LAB — CBC WITH DIFFERENTIAL/PLATELET
Basophils Absolute: 0.1 10*3/uL (ref 0.0–0.1)
Basophils Relative: 1 %
EOS ABS: 0.1 10*3/uL (ref 0.0–0.7)
EOS PCT: 1 %
HCT: 34.3 % — ABNORMAL LOW (ref 39.0–52.0)
HEMOGLOBIN: 11.8 g/dL — AB (ref 13.0–17.0)
LYMPHS ABS: 1.8 10*3/uL (ref 0.7–4.0)
LYMPHS PCT: 14 %
MCH: 35.9 pg — AB (ref 26.0–34.0)
MCHC: 34.4 g/dL (ref 30.0–36.0)
MCV: 104.3 fL — AB (ref 78.0–100.0)
MONOS PCT: 6 %
Monocytes Absolute: 0.8 10*3/uL (ref 0.1–1.0)
NEUTROS PCT: 78 %
Neutro Abs: 10.1 10*3/uL — ABNORMAL HIGH (ref 1.7–7.7)
Platelets: 340 10*3/uL (ref 150–400)
RBC: 3.29 MIL/uL — AB (ref 4.22–5.81)
RDW: 15.8 % — ABNORMAL HIGH (ref 11.5–15.5)
WBC: 12.9 10*3/uL — AB (ref 4.0–10.5)

## 2015-08-24 MED ORDER — OXYCODONE HCL 5 MG PO TABS
10.0000 mg | ORAL_TABLET | Freq: Once | ORAL | Status: AC
  Administered 2015-08-24: 10 mg via ORAL
  Filled 2015-08-24: qty 2

## 2015-08-24 MED ORDER — SODIUM CHLORIDE 0.9 % IV BOLUS (SEPSIS)
1000.0000 mL | Freq: Once | INTRAVENOUS | Status: AC
  Administered 2015-08-24: 1000 mL via INTRAVENOUS

## 2015-08-24 NOTE — ED Notes (Signed)
Patient sent Dr. Olevia Perches office for abnormal potassium level.

## 2015-08-24 NOTE — ED Notes (Signed)
Patient with no complaints at this time. Respirations even and unlabored. Skin warm/dry. Discharge instructions reviewed with patient at this time. Patient given opportunity to voice concerns/ask questions. IV removed per policy and band-aid applied to site. Patient discharged at this time and left Emergency Department with steady gait.  Advised patient to hold spironolactone per MD orders due to hyperkalemia until he follows up with PCP/GI for repeat blood work in approximately 2 days. Patient gave verbal understanding.

## 2015-08-24 NOTE — ED Provider Notes (Addendum)
CSN: [id number]     Arrival date & time 08/24/15  1017 History  By signing my name below, I, Hansel Feinstein, attest that this documentation has been prepared under the direction and in the presence of Deno Etienne, DO. Electronically Signed: Hansel Feinstein, ED Scribe. 08/24/2015. 12:42 PM.    Chief Complaint  Patient presents with  . Abnormal Lab   Patient is a 54 y.o. male presenting with general illness. The history is provided by the patient. No language interpreter was used.  Illness Location:  Abnormal lab Severity:  Moderate Onset quality: elevated potassium; no hx of similar. Duration:  1 day Timing:  Constant Progression:  Unchanged Chronicity:  New Context:  Pt states he feels like he is at his baseline; is chronically ill secondary to cancer Relieved by:  76m oxycodone Associated symptoms: chest pain, fatigue ( generalized), myalgias ( generalized ) and shortness of breath   Associated symptoms: no abdominal pain, no congestion, no diarrhea, no fever, no headaches, no rash and no vomiting     HPI Comments: LLATRAIL POUNDERSis a 54y.o. male with h/o MI, pancreatic and lung cancer referred to the ED by his PCP who presents to the Emergency Department due to an elevated potassium level. He states some current mild CP, SOB that he attributes to fluid in his lungs. Pt is in remission for lung cancer. Last chemo was in 2008. He reports that he still has decreased appetite at baseline. He notes normal fluid intake. Pt states that he is in a lot of pain and feels generally ill at baseline and reports that this has not worsened today. No h/o elevated potassium. He takes 124mOxycodone for pain management at home.   Past Medical History  Diagnosis Date  . Lateral epicondylitis   . Chest pain, unspecified   . Abdominal pain, other specified site   . Obstruction of bile duct   . Tobacco use disorder   . Anxiety   . Shortness of breath     uses oxygen at night- 2 liters   [facility] [place]  . Complication of anesthesia     pt. reports that he had MI while under anesth. for Whipple procedure  . [place]     [place] [facility] [place].  [facility]   Past Surgical History  Procedure Laterality Date  . Whipple procedure  2007  . Percutaneous extraction of gallstones      [facility]. [facility]nee [facility]rosc[facility]      [facility]      open  . Video bronchoscopy  04/08/2012    Procedure: VIDEO BRONCHOSCOPY;  Surgeon: BrGaye PollackMD;  [facility];  Service: Thoracic;  Laterality: N/A;  . Partial right lung removal    . Colonoscopy  May 2014    Dr. DeAnthony Sarnormal   . Esophagogastroduodenoscopy  Dec 2015    [facility]. normal esophagus, patent afferent and efferent libs, no abnormalities in jejunum.   . Bone marrow biopsy Left 05/25/15  . Bone marrow aspiration Left 05/25/15   Family History  Problem Relation Age of Onset  . Stroke Father 6223  Deceased  . Anesthesia problems Neg Hx   . Colon cancer Neg Hx   . Cirrhosis Neg Hx    Social History  Substance Use Topics  . Smoking status: Former  Smoker -- 1.00 packs/day for 28 years    Types: Cigarettes    Quit date: 01/02/2012  . Smokeless tobacco: Never Used  . Alcohol Use: 0.0 oz/week    0 Standard drinks or equivalent per week     Comment:  Rare alcohol use    Review of Systems  Constitutional: Positive for appetite change ( baseline) and fatigue ( generalized). Negative for fever and chills.  HENT: Negative for congestion and facial swelling.   Eyes: Negative for discharge and visual disturbance.  Respiratory: Positive for shortness of breath.   Cardiovascular: Positive for chest pain. Negative for palpitations.  Gastrointestinal: Negative for vomiting, abdominal pain and diarrhea.  Musculoskeletal: Positive for myalgias ( generalized ) and arthralgias ( baseline).  Skin: Negative for  color change and rash.  Neurological: Negative for tremors, syncope and headaches.  Psychiatric/Behavioral: Negative for confusion and dysphoric mood.  All other systems reviewed and are negative.  Allergies  Review of patient's allergies indicates no known allergies.  Home Medications   Prior to Admission medications   Medication Sig Start Date End Date Taking? Authorizing Provider  acetaminophen (TYLENOL) 500 MG tablet Take 500 mg by mouth every 6 (six) hours as needed for mild pain or moderate pain.    Historical Provider, MD  aspirin 81 MG tablet Take 81 mg by mouth daily.    Historical Provider, MD  enoxaparin (LOVENOX) 100 MG/ML injection Inject 1 mL (100 mg total) into the skin daily. Patient not taking: Reported on 08/23/2015 08/08/15   Patrici Ranks, MD  feeding supplement, ENSURE ENLIVE, (ENSURE ENLIVE) LIQD Take 237 mLs by mouth 2 (two) times daily between meals. Patient not taking: Reported on 08/23/2015 04/23/15   Velta Addison Mikhail, DO  furosemide (LASIX) 40 MG tablet Take 1 tablet (40 mg total) by mouth daily. 08/10/15   Butch Penny, NP  ondansetron (ZOFRAN) 4 MG tablet Take 1 tablet (4 mg total) by mouth 4 (four) times daily -  with meals and at bedtime. 06/20/15   Orvil Feil, NP  OVER THE COUNTER MEDICATION Apply 1 application topically as needed (for hemorrhoids). OTC Hemorrhoid cream    Historical Provider, MD  Oxycodone HCl 10 MG TABS Take 1 tablet (10 mg total) by mouth every 4 (four) hours. 08/08/15   Patrici Ranks, MD  spironolactone (ALDACTONE) 25 MG tablet Take 2 tablets (50 mg total) by mouth daily. With lasix 05/12/15   Orvil Feil, NP  spironolactone (ALDACTONE) 50 MG tablet Take 2 tablets (100 mg total) by mouth daily. 08/10/15   Rona Ravens Setzer, NP   BP 88/76 mmHg  Pulse 86  Temp(Src) 97.4 F (36.3 C) (Oral)  Resp 9  Ht '6\' 3"'  (1.905 m)  Wt 132 lb (59.875 kg)  BMI 16.50 kg/m2  SpO2 100% Physical Exam  Constitutional: He is oriented to person, place, and  time. He appears well-developed. He appears cachectic.  Cachectic. Chronically ill appearing.   HENT:  Head: Normocephalic and atraumatic.  Eyes: Conjunctivae and EOM are normal. Pupils are equal, round, and reactive to light.  Neck: Normal range of motion. Neck supple.  Cardiovascular: Normal rate, regular rhythm and normal heart sounds.   Pulmonary/Chest: Effort normal and breath sounds normal. No respiratory distress.  Lungs CTA bilaterally.   Abdominal: Soft. Bowel sounds are normal. He exhibits no distension. There is no tenderness.  Musculoskeletal: Normal range of motion.  Neurological: He is alert and oriented to person, place, and time.  Skin: Skin is  warm and dry.  Psychiatric: He has a normal mood and affect. His behavior is normal.  Nursing note and vitals reviewed.  ED Course  Procedures (including critical care time) DIAGNOSTIC STUDIES: Oxygen Saturation is 100% on RA, normal by my interpretation.    COORDINATION OF CARE: 12:22 PM Discussed treatment plan with pt at bedside and pt agreed to plan.   Labs Review Labs Reviewed  BASIC METABOLIC PANEL - Abnormal; Notable for the following:    Sodium 134 (*)    Potassium 5.6 (*)    Glucose, Bld 115 (*)    BUN 22 (*)    Calcium 7.9 (*)    All other components within normal limits  CBC WITH DIFFERENTIAL/PLATELET - Abnormal; Notable for the following:    WBC 12.9 (*)    RBC 3.29 (*)    Hemoglobin 11.8 (*)    HCT 34.3 (*)    MCV 104.3 (*)    MCH 35.9 (*)    RDW 15.8 (*)    Neutro Abs 10.1 (*)    All other components within normal limits    Imaging Review No results found. I have personally reviewed and evaluated these images and lab results as part of my medical decision-making.   EKG Interpretation   Date/Time:  Thursday August 24 2015 12:07:26 EDT Ventricular Rate:  89 PR Interval:  145 QRS Duration: 77 QT Interval:  358 QTC Calculation: 436 R Axis:   67 Text Interpretation:  Sinus rhythm Low voltage,  precordial leads Baseline  wander in lead(s) V6 No significant change since last tracing Confirmed by  Marcellene Shivley MD, Quillian Quince ([id number]) on 08/24/2015 12:35:57 PM      MDM   Final diagnoses:  AKI (acute kidney injury) ([place])  Hyperkalemia    54 yo M with a significant past medical history of pancreatic cancer comes in with a chief complaint of hyperkalemia. This was found on routine lab check 2 days ago. Patient was instructed to come here. Patient is chronically ill but has no acute change in symptoms. Repeat potassium is 5.6. There are no EKG changes patient's AKI mildly improved. Patient was given a liter of fluids. Patient feeling much better. Soft blood pressures while in the ED, appear to be typical for him. MAPS greater than 80. Will encourage increased fluid intake home. Discuss results with patient he would like home and follow with his doctor in a couple days for lab recheck. Will have him hold spironolactone until sees pcp  I personally performed the services described in this documentation, which was scribed in my presence. The recorded information has been reviewed and is accurate.    1:09 PM:  I have discussed the diagnosis/risks/treatment options with the patient and believe the pt to be eligible for discharge home to follow-up with PCP. We also discussed returning to the ED immediately if new or worsening sx occur. We discussed the sx which are most concerning (e.g., sudden worsening pain, fever, inability to tolerate by mouth) that necessitate immediate return. Medications administered to the patient during their visit and any new prescriptions provided to the patient are listed below.  Medications given during this visit Medications  sodium chloride 0.9 % bolus 1,000 mL (1,000 mLs Intravenous New Bag/Given 08/24/15 1224)  oxyCODONE (Oxy IR/ROXICODONE) immediate release tablet 10 mg (10 mg Oral Given 08/24/15 1227)    New Prescriptions   No medications on file    The patient  appears reasonably screen and/or stabilized for discharge and I doubt any other  medical condition or other [facility] requiring further screening, evaluation, or treatment in the ED at this time prior to discharge.    Deno Etienne, DO 08/24/15 [place], DO 08/24/15 1318

## 2015-08-24 NOTE — Telephone Encounter (Signed)
I have advised patient he needs to go to the ED. His potassium was critically high.  I advised him to hold his fluid pills.

## 2015-08-24 NOTE — Discharge Instructions (Signed)
Drink plenty of fluids, return for worsening symptoms, fatigue.  Acute Kidney Injury Acute kidney injury is any condition in which there is sudden (acute) damage to the kidneys. Acute kidney injury was previously known as acute kidney failure or acute renal failure. The kidneys are two organs that lie on either side of the spine between the middle of the back and the front of the abdomen. The kidneys:  Remove wastes and extra water from the blood.   Produce important hormones. These help keep bones strong, regulate blood pressure, and help create red blood cells.   Balance the fluids and chemicals in the blood and tissues. A small amount of kidney damage may not cause problems, but a large amount of damage may make it difficult or impossible for the kidneys to work the way they should. Acute kidney injury may develop into long-lasting (chronic) kidney disease. It may also develop into a life-threatening disease called end-stage kidney disease. Acute kidney injury can get worse very quickly, so it should be treated right away. Early treatment may prevent other kidney diseases from developing. CAUSES   A problem with blood flow to the kidneys. This may be caused by:   Blood loss.   Heart disease.   Severe burns.   Liver disease.  Direct damage to the kidneys. This may be caused by:  Some medicines.   A kidney infection.   Poisoning or consuming toxic substances.   A surgical wound.   A blow to the kidney area.   A problem with urine flow. This may be caused by:   Cancer.   Kidney stones.   An enlarged prostate. SIGNS AND SYMPTOMS   Swelling (edema) of the legs, ankles, or feet.   Tiredness (lethargy).   Nausea or vomiting.   Confusion.   Problems with urination, such as:   Painful or burning feeling during urination.   Decreased urine production.   Frequent accidents in children who are potty trained.   Bloody urine.   Muscle twitches  and cramps.   Shortness of breath.   Seizures.   Chest pain or pressure. Sometimes, no symptoms are present. DIAGNOSIS Acute kidney injury may be detected and diagnosed by tests, including blood, urine, imaging, or kidney biopsy tests.  TREATMENT Treatment of acute kidney injury varies depending on the cause and severity of the kidney damage. In mild cases, no treatment may be needed. The kidneys may heal on their own. If acute kidney injury is more severe, your health care provider will treat the cause of the kidney damage, help the kidneys heal, and prevent complications from occurring. Severe cases may require a procedure to remove toxic wastes from the body (dialysis) or surgery to repair kidney damage. Surgery may involve:   Repair of a torn kidney.   Removal of an obstruction. HOME CARE INSTRUCTIONS  Follow your prescribed diet.  Take medicines only as directed by your health care provider.  Do not take any new medicines (prescription, over-the-counter, or nutritional supplements) unless approved by your health care provider. Many medicines can worsen your kidney damage or may need to have the dose adjusted.   Keep all follow-up visits as directed by your health care provider. This is important.  Observe your condition to make sure you are healing as expected. SEEK IMMEDIATE MEDICAL CARE IF:  You are feeling ill or have severe pain in the back or side.   Your symptoms return or you have new symptoms.  You have any symptoms of end-stage  kidney disease. These include:   Persistent itchiness.   Loss of appetite.   Headaches.   Abnormally dark or light skin.  Numbness in the hands or feet.   Easy bruising.   Frequent hiccups.   Menstruation stops.   You have a fever.  You have increased urine production.  You have pain or bleeding when urinating. MAKE SURE YOU:   Understand these instructions.  Will watch your condition.  Will get help  right away if you are not doing well or get worse.   This information is not intended to replace advice given to you by your health care provider. Make sure you discuss any questions you have with your health care provider.   Document Released: 05/06/2011 Document Revised: 11/11/2014 Document Reviewed: 06/19/2012 Elsevier Interactive Patient Education [employer].

## 2015-08-25 ENCOUNTER — Telehealth (INDEPENDENT_AMBULATORY_CARE_PROVIDER_SITE_OTHER): Payer: Self-pay | Admitting: *Deleted

## 2015-08-25 DIAGNOSIS — K7031 Alcoholic cirrhosis of liver with ascites: Secondary | ICD-10-CM

## 2015-08-25 NOTE — Telephone Encounter (Signed)
.  Per Lelon Perla patient to have labs 08/31/15.

## 2015-08-28 ENCOUNTER — Telehealth (INDEPENDENT_AMBULATORY_CARE_PROVIDER_SITE_OTHER): Payer: Self-pay | Admitting: *Deleted

## 2015-08-28 ENCOUNTER — Ambulatory Visit (HOSPITAL_COMMUNITY)
Admission: RE | Admit: 2015-08-28 | Discharge: 2015-08-28 | Disposition: A | Discharge status: Home / Self Care | Payer: Medicare Other | Source: Ambulatory Visit | Attending: Internal Medicine | Admitting: Internal Medicine

## 2015-08-28 DIAGNOSIS — K7031 Alcoholic cirrhosis of liver with ascites: Secondary | ICD-10-CM

## 2015-08-28 LAB — BODY FLUID CELL COUNT WITH DIFFERENTIAL
EOS FL: 0 %
Lymphs, Fluid: 36 %
MONOCYTE-MACROPHAGE-SEROUS FLUID: 22 % — AB (ref 50–90)
Neutrophil Count, Fluid: 42 % — ABNORMAL HIGH (ref 0–25)
WBC FLUID: 53 uL (ref 0–1000)

## 2015-08-28 LAB — GRAM STAIN

## 2015-08-28 NOTE — Progress Notes (Signed)
Paracentesis complete no signs of distress. 5000 ml yellow colored ascites removed.

## 2015-08-28 NOTE — Telephone Encounter (Signed)
Patient presented to OV today with abdominal distention and vomiting bile colored vomitus.  Am going to get an US paracentesis.

## 2015-08-28 NOTE — Telephone Encounter (Signed)
Needs to have fluid drawn off of his stomach. The return phone number is [phone].

## 2015-08-28 NOTE — Procedures (Signed)
PreOperative Dx: Alcoholic cirrhosis, ascites Postoperative Dx: Alcoholic cirrhosis, ascites Procedure:   US guided paracentesis Radiologist:  Thornton Papas Anesthesia:  10 ml of 1% lidocaine Specimen:  5000 ml of yellow ascitic fluid EBL:   < 1 ml Complications: None

## 2015-08-28 NOTE — Telephone Encounter (Signed)
Patient will come by office and let me see how much he is distended before I order a paracentesis.

## 2015-08-29 LAB — PATHOLOGIST SMEAR REVIEW

## 2015-08-31 ENCOUNTER — Encounter (INDEPENDENT_AMBULATORY_CARE_PROVIDER_SITE_OTHER): Payer: Self-pay | Admitting: *Deleted

## 2015-08-31 ENCOUNTER — Other Ambulatory Visit (INDEPENDENT_AMBULATORY_CARE_PROVIDER_SITE_OTHER): Payer: Self-pay | Admitting: *Deleted

## 2015-08-31 ENCOUNTER — Telehealth (INDEPENDENT_AMBULATORY_CARE_PROVIDER_SITE_OTHER): Payer: Self-pay | Admitting: Internal Medicine

## 2015-08-31 ENCOUNTER — Telehealth (INDEPENDENT_AMBULATORY_CARE_PROVIDER_SITE_OTHER): Payer: Self-pay | Admitting: *Deleted

## 2015-08-31 DIAGNOSIS — K7030 Alcoholic cirrhosis of liver without ascites: Secondary | ICD-10-CM

## 2015-08-31 NOTE — Telephone Encounter (Signed)
Labs have been ordered for 09/11/15.

## 2015-08-31 NOTE — Telephone Encounter (Signed)
.  Per Lelon Perla patient to have labs prior to office visit.

## 2015-08-31 NOTE — Telephone Encounter (Signed)
Patrick Lee, needs CBC, CMET, and PT/INR next week before OV

## 2015-09-01 LAB — COMPREHENSIVE METABOLIC PANEL
ALT: 15 U/L (ref 9–46)
AST: 22 U/L (ref 10–35)
Albumin: 1.6 g/dL — ABNORMAL LOW (ref 3.6–5.1)
Alkaline Phosphatase: 118 U/L — ABNORMAL HIGH (ref 40–115)
BUN: 24 mg/dL (ref 7–25)
CHLORIDE: 104 mmol/L (ref 98–110)
CO2: 23 mmol/L (ref 20–31)
Calcium: 7.4 mg/dL — ABNORMAL LOW (ref 8.6–10.3)
Creat: 0.93 mg/dL (ref 0.70–1.33)
GLUCOSE: 130 mg/dL — AB (ref 65–99)
POTASSIUM: 4.5 mmol/L (ref 3.5–5.3)
SODIUM: 133 mmol/L — AB (ref 135–146)
Total Bilirubin: 0.5 mg/dL (ref 0.2–1.2)
Total Protein: 4.4 g/dL — ABNORMAL LOW (ref 6.1–8.1)

## 2015-09-01 LAB — CBC
HEMATOCRIT: 34.8 % — AB (ref 39.0–52.0)
Hemoglobin: 11.5 g/dL — ABNORMAL LOW (ref 13.0–17.0)
MCH: 34.4 pg — ABNORMAL HIGH (ref 26.0–34.0)
MCHC: 33 g/dL (ref 30.0–36.0)
MCV: 104.2 fL — ABNORMAL HIGH (ref 78.0–100.0)
MPV: 9.4 fL (ref 8.6–12.4)
Platelets: 307 10*3/uL (ref 150–400)
RBC: 3.34 MIL/uL — ABNORMAL LOW (ref 4.22–5.81)
RDW: 14 % (ref 11.5–15.5)
WBC: 15.2 10*3/uL — AB (ref 4.0–10.5)

## 2015-09-01 LAB — AFP TUMOR MARKER: AFP TUMOR MARKER: 2.3 ng/mL (ref <6.1)

## 2015-09-01 LAB — PROTIME-INR
INR: 0.99 (ref <1.50)
Prothrombin Time: 13.2 seconds (ref 11.6–15.2)

## 2015-09-02 LAB — CULTURE, BODY FLUID W GRAM STAIN -BOTTLE: Culture: NO GROWTH

## 2015-09-04 ENCOUNTER — Telehealth (INDEPENDENT_AMBULATORY_CARE_PROVIDER_SITE_OTHER): Payer: Self-pay | Admitting: *Deleted

## 2015-09-04 ENCOUNTER — Other Ambulatory Visit (INDEPENDENT_AMBULATORY_CARE_PROVIDER_SITE_OTHER): Payer: Self-pay | Admitting: Internal Medicine

## 2015-09-04 ENCOUNTER — Ambulatory Visit (HOSPITAL_COMMUNITY)
Admission: RE | Admit: 2015-09-04 | Discharge: 2015-09-04 | Disposition: A | Discharge status: Home / Self Care | Payer: Medicare Other | Source: Ambulatory Visit | Attending: Internal Medicine | Admitting: Internal Medicine

## 2015-09-04 ENCOUNTER — Encounter (HOSPITAL_COMMUNITY): Payer: Self-pay

## 2015-09-04 DIAGNOSIS — C259 Malignant neoplasm of pancreas, unspecified: Secondary | ICD-10-CM | POA: Diagnosis not present

## 2015-09-04 DIAGNOSIS — R188 Other ascites: Secondary | ICD-10-CM | POA: Insufficient documentation

## 2015-09-04 DIAGNOSIS — C3490 Malignant neoplasm of unspecified part of unspecified bronchus or lung: Secondary | ICD-10-CM | POA: Diagnosis not present

## 2015-09-04 DIAGNOSIS — Z7982 Long term (current) use of aspirin: Secondary | ICD-10-CM | POA: Insufficient documentation

## 2015-09-04 LAB — BODY FLUID CELL COUNT WITH DIFFERENTIAL
Eos, Fluid: 0 %
Lymphs, Fluid: 12 %
MONOCYTE-MACROPHAGE-SEROUS FLUID: 67 % (ref 50–90)
Neutrophil Count, Fluid: 21 % (ref 0–25)
WBC FLUID: 87 uL (ref 0–1000)

## 2015-09-04 LAB — GRAM STAIN

## 2015-09-04 MED ORDER — ALBUMIN HUMAN 25 % IV SOLN
INTRAVENOUS | Status: AC
  Administered 2015-09-04: 50 g
  Filled 2015-09-04: qty 200

## 2015-09-04 NOTE — Addendum Note (Signed)
Addended by: Butch Penny on: 09/04/2015 08:26 AM   Modules accepted: Orders

## 2015-09-04 NOTE — Telephone Encounter (Signed)
Can not eat, vomiting acid and needs a tap. Please have Terri call him as soon as possible. He may need an apt. The return phone number is [phone].

## 2015-09-04 NOTE — Telephone Encounter (Signed)
Paracentesis ordered

## 2015-09-04 NOTE — Progress Notes (Signed)
Paracentesis complete no signs of distress. 5700 ml yellow colored ascites removed.

## 2015-09-04 NOTE — Procedures (Signed)
PreOperative Dx: Ascites Postoperative Dx: Ascites Procedure:   US guided paracentesis Radiologist:  Thornton Papas Anesthesia:  10 ml of 1% lidocaine Specimen:  5700 ml of light yellow ascitic fluid EBL:   < 1 ml Complications: None

## 2015-09-05 LAB — PATHOLOGIST SMEAR REVIEW

## 2015-09-07 ENCOUNTER — Other Ambulatory Visit (HOSPITAL_COMMUNITY): Payer: Self-pay

## 2015-09-07 ENCOUNTER — Ambulatory Visit (INDEPENDENT_AMBULATORY_CARE_PROVIDER_SITE_OTHER): Payer: Medicare Other | Admitting: Internal Medicine

## 2015-09-09 LAB — CULTURE, BODY FLUID W GRAM STAIN -BOTTLE: Culture: NO GROWTH

## 2015-09-09 LAB — CULTURE, BODY FLUID-BOTTLE

## 2015-09-11 ENCOUNTER — Encounter (HOSPITAL_COMMUNITY): Payer: Medicare Other | Attending: Hematology & Oncology | Admitting: Hematology & Oncology

## 2015-09-11 ENCOUNTER — Ambulatory Visit (HOSPITAL_COMMUNITY)
Admission: RE | Admit: 2015-09-11 | Discharge: 2015-09-11 | Disposition: A | Discharge status: Home / Self Care | Payer: Medicare Other | Source: Ambulatory Visit | Attending: Oncology | Admitting: Oncology

## 2015-09-11 ENCOUNTER — Encounter (HOSPITAL_COMMUNITY): Payer: Medicare Other

## 2015-09-11 ENCOUNTER — Other Ambulatory Visit (HOSPITAL_COMMUNITY): Payer: Self-pay | Admitting: Oncology

## 2015-09-11 VITALS — BP 81/58 | HR 118 | Temp 97.4°F | Resp 16 | Wt 138.2 lb

## 2015-09-11 DIAGNOSIS — D72829 Elevated white blood cell count, unspecified: Secondary | ICD-10-CM

## 2015-09-11 DIAGNOSIS — K7031 Alcoholic cirrhosis of liver with ascites: Secondary | ICD-10-CM

## 2015-09-11 DIAGNOSIS — I2699 Other pulmonary embolism without acute cor pulmonale: Secondary | ICD-10-CM | POA: Insufficient documentation

## 2015-09-11 DIAGNOSIS — I82403 Acute embolism and thrombosis of unspecified deep veins of lower extremity, bilateral: Secondary | ICD-10-CM | POA: Insufficient documentation

## 2015-09-11 DIAGNOSIS — Z86711 Personal history of pulmonary embolism: Secondary | ICD-10-CM

## 2015-09-11 DIAGNOSIS — Z8507 Personal history of malignant neoplasm of pancreas: Secondary | ICD-10-CM | POA: Insufficient documentation

## 2015-09-11 DIAGNOSIS — D649 Anemia, unspecified: Secondary | ICD-10-CM | POA: Insufficient documentation

## 2015-09-11 DIAGNOSIS — I82401 Acute embolism and thrombosis of unspecified deep veins of right lower extremity: Secondary | ICD-10-CM

## 2015-09-11 DIAGNOSIS — R188 Other ascites: Secondary | ICD-10-CM

## 2015-09-11 DIAGNOSIS — F1021 Alcohol dependence, in remission: Secondary | ICD-10-CM

## 2015-09-11 DIAGNOSIS — D6489 Other specified anemias: Secondary | ICD-10-CM

## 2015-09-11 DIAGNOSIS — Z85118 Personal history of other malignant neoplasm of bronchus and lung: Secondary | ICD-10-CM | POA: Insufficient documentation

## 2015-09-11 DIAGNOSIS — K7460 Unspecified cirrhosis of liver: Secondary | ICD-10-CM | POA: Diagnosis not present

## 2015-09-11 DIAGNOSIS — R109 Unspecified abdominal pain: Secondary | ICD-10-CM

## 2015-09-11 DIAGNOSIS — G8929 Other chronic pain: Secondary | ICD-10-CM

## 2015-09-11 DIAGNOSIS — Z87891 Personal history of nicotine dependence: Secondary | ICD-10-CM | POA: Insufficient documentation

## 2015-09-11 DIAGNOSIS — I82402 Acute embolism and thrombosis of unspecified deep veins of left lower extremity: Secondary | ICD-10-CM

## 2015-09-11 DIAGNOSIS — Z86718 Personal history of other venous thrombosis and embolism: Secondary | ICD-10-CM

## 2015-09-11 MED ORDER — APIXABAN 2.5 MG PO TABS: 2.5000 mg | ORAL_TABLET | Freq: Two times a day (BID) | ORAL | Status: DC

## 2015-09-11 NOTE — Patient Instructions (Signed)
[place] at [facility] Discharge Instructions  RECOMMENDATIONS MADE BY THE CONSULTANT AND ANY TEST RESULTS WILL BE SENT TO YOUR REFERRING PHYSICIAN.  Decrease aspirin to 1 tablet daily. Patient was given a Anari Evitt RX for Eliquis 2.5 mg 2 times daily. Paracentesis today. Return as scheduled.  Thank you for choosing [place] at [facility] to provide your oncology and hematology care.  To afford each patient quality time with our provider, please arrive at least 15 minutes before your scheduled appointment time.    You need to re-schedule your appointment should you arrive 10 or more minutes late.  We strive to give you quality time with our providers, and arriving late affects you and other patients whose appointments are after yours.  Also, if you no show three or more times for appointments you may be dismissed from the clinic at the providers discretion.     Again, thank you for choosing [facility].  Our hope is that these requests will decrease the amount of time that you wait before being seen by our physicians.       _____________________________________________________________  Should you have questions after your visit to [facility], please contact our office at [phone] between the hours of 8:30 a.m. and 4:30 p.m.  Voicemails left after 4:30 p.m. will not be returned until the following business day.  For prescription refill requests, have your pharmacy contact our office.

## 2015-09-11 NOTE — Progress Notes (Signed)
[facility] Hematology/Oncology Consultation   Name: Patrick Lee      MRN: [id number]   Date: 09/11/2015 Time:1:38 PM   REFERRING PHYSICIAN:  Wendie Simmer  REASON FOR CONSULT:  Anemia in the setting of anticoagulation for VTE.   DIAGNOSIS:  Macrocytic, normochromic anemia with normal WBC and platelet count in the setting of anticoagulation with Lovenox for VTE History of Pancreatic Cancer, stage IIB Chronic abdominal pain post Whipple Elevated CA 19-9 in 12/15, ? significance all prior values since surgery WNL elevated pre-surgery Weight Loss Cirrhosis Alcohol use  HISTORY OF PRESENT ILLNESS:   Patrick Lee is a 54 year old white American man with a past medical history significant for pancreatic cancer s/p whipple procedure in 2008 by Dr. 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 He has cirrhosis and ascites.   Patrick Lee is here alone today. He notes he is "retaining fluid again" and vomiting frequently.  Patrick Lee went to the ED one week ago due to his potassium levels as advised by Dr. Daisey Must. He is not taking potassium but continues on spironolactone.   He was not able to fill his Lovenox prescription. When he went to the pharmacy they told him he had reached his insurance cap with [employer] and he was unable to pay the $500 for the prescription.  He reports taking two adult aspirin daily.   He drinks two ensure daily along with carnation instant breakfast, but notes he has limited oral intake because his "fluid on his belly" makes it hard for him to eat.   PAST MEDICAL HISTORY:   Past Medical History  Diagnosis Date  . Lateral epicondylitis   . Chest pain, unspecified   . Abdominal pain, other specified site   . Obstruction of bile duct   . Tobacco use disorder   . Anxiety   . Shortness of breath     uses  oxygen at night- 2 liters   . Myocardial infarction [facility]) 2007    during [place]- 2007  . Complication of anesthesia     pt. reports that he had MI while under anesth. for Whipple procedure  . Pancreatic cancer ([place])      status post Whipple procedure.  . Lung cancer ([place])     ALLERGIES: No Known Allergies    MEDICATIONS: I have reviewed the patient's current medications.    Current Outpatient Prescriptions on File Prior to Visit  Medication Sig Dispense Refill  . feeding supplement, ENSURE ENLIVE, (ENSURE ENLIVE) LIQD Take 237 mLs by mouth 2 (two) times daily between meals. 237 mL 12  . furosemide (LASIX) 40 MG tablet Take 1 tablet (40 mg total) by mouth daily. 30 tablet 3  . ondansetron (ZOFRAN) 4 MG tablet Take 1 tablet (4 mg total) by mouth 4 (four) times daily -  with meals and at bedtime. 120 tablet 3  . OVER THE COUNTER MEDICATION Apply 1 application topically as needed (for hemorrhoids). OTC Hemorrhoid cream    . Oxycodone HCl 10 MG TABS Take 1 tablet (10 mg total) by mouth every 4 (four) hours. 80 tablet 0  . spironolactone (ALDACTONE) 50 MG tablet Take 2 tablets (100 mg total) by mouth daily. 100 tablet 3  . acetaminophen (TYLENOL) 500 MG tablet Take 500 mg by mouth every 6 (six) hours  as needed for mild pain or moderate pain.    Marland Kitchen aspirin 81 MG tablet Take 81 mg by mouth daily.    Marland Kitchen enoxaparin (LOVENOX) 100 MG/ML injection Inject 1 mL (100 mg total) into the skin daily. (Patient not taking: Reported on 08/23/2015) 30 Syringe 0  . spironolactone (ALDACTONE) 25 MG tablet Take 2 tablets (50 mg total) by mouth daily. With lasix (Patient not taking: Reported on 09/11/2015) 60 tablet 3   No current facility-administered medications on file prior to visit.     PAST SURGICAL HISTORY Past Surgical History  Procedure Laterality Date  . Whipple procedure  2007  . Percutaneous extraction of gallstones      2010[facility]  . Knee arthroscopy      R knee-  at [facility]  . Cholecystectomy      open  . Video bronchoscopy  04/08/2012    Procedure: VIDEO BRONCHOSCOPY;  Surgeon: Gaye Pollack, MD;  Location: [facility] OR;  Service: Thoracic;  Laterality: N/A;  . Partial right lung removal    . Colonoscopy  May 2014    Dr. Anthony Sar: normal   . Esophagogastroduodenoscopy  Dec 2015    [facility]. normal esophagus, patent afferent and efferent libs, no abnormalities in jejunum.   . Bone marrow biopsy Left 05/25/15  . Bone marrow aspiration Left 05/25/15    FAMILY HISTORY: Family History  Problem Relation Age of Onset  . Stroke Father 68    Deceased  . Anesthesia problems Neg Hx   . Colon cancer Neg Hx   . Cirrhosis Neg Hx    Patient has 4 children, 3 sons (ages 26, 22, and 59) who are healthy and 1 daughter who is 71 years old (healthy).  His mother is 54 years old and she is healthy and still working.  His father is 35 and recently had a stroke.  He is divorced x 2.  SOCIAL HISTORY: He notes that he quit drinking EtOH about 1 year ago, but used to drink 1 case of beer weekly for many years, more when he was younger.  He also has a 40 pack year smoking history quitting recently.  He has a distant history of dip.  He denies any illicit drug abuse.  He is currently disabled from his pancreatic cancer treatment, he reports, and used to work at [employer] as a Merchant navy officer.  PERFORMANCE STATUS: The patient's performance status is 2 - Symptomatic, <50% confined to bed  ROS Positive for abdominal pain Positive for vomiting 14 point review of systems was performed and is negative except as detailed under history of present illness and above   PHYSICAL EXAM: Most Recent Vital Signs: Blood pressure 81/58, pulse 118, temperature 97.4 F (36.3 C), temperature source Oral, resp. rate 16, weight 138 lb 3.2 oz (62.687 kg), SpO2 100 %.  General: Ambulates with assistance of a cane. General appearance: alert, cooperative, appears older than stated age  Head:  Normocephalic, without obvious abnormality, atraumatic Eyes: negative findings: lids and lashes normal, conjunctivae and sclerae normal, corneas clear and pupils equal, round, reactive to light and accomodation, positive findings: small pupils Throat: normal findings: lips normal without lesions, buccal mucosa normal, palate normal, tongue midline and normal and soft palate, uvula, and tonsils normal Neck: no adenopathy, supple, symmetrical, trachea midline and thyroid not enlarged, symmetric, no tenderness/mass/nodules Lungs: clear to auscultation bilaterally and normal percussion bilaterally Heart:Tachycardic S1, S2 normal, no murmur, click, rub or gallop Abdomen:no palpable masses, distended, ascites.  moderate tenderness  in the entire abdomen  Extremities: Bruising on hands and arms. No edema.  Skin: Skin color, texture, turgor normal. No rashes or lesions. L bone marrow biopsy site is examined and completely healed Lymph nodes: Cervical, supraclavicular, and axillary nodes normal. Neurologic: Grossly normal   LABORATORY DATA:   RADIOGRAPHY: CLINICAL DATA: Subsequent treatment strategy for pancreatic and lung cancer ; weight loss, and night sweats.  EXAM: NUCLEAR MEDICINE PET SKULL BASE TO THIGH  TECHNIQUE: 6.5 mCi F-18 FDG was injected intravenously. Full-ring PET imaging was performed from the skull base to thigh after the radiotracer. CT data was obtained and used for attenuation correction and anatomic localization.  FASTING BLOOD GLUCOSE: Value: 114 mg/dl  COMPARISON: Multiple exams, including 04/14/2015 and 01/23/2012  FINDINGS: NECK  No hypermetabolic lymph nodes in the neck.  CHEST  Increase in lingular nodular atelectasis/scarring compared to the exam from 19 days ago, with faint hypermetabolic uptake in this vicinity, maximum standard uptake value 2.1. Moderate right pleural effusion. Nodularity peripherally in both lower lobes, on the right side  with maximum standard uptake value 1.4 and on the left with maximum standard uptake value 1.5, likely from atelectasis/inflammation.  Coronary artery atherosclerosis. Mild paraseptal emphysema.  ABDOMEN/PELVIS  Prior Whipple. Extensive ascites. There is some physiologic areas of high bowel activity. In the right abdomen just above the iliac crests, there is high activity not corresponding to a visible mass or loop of bowel, maximum standard uptake value 11.0. It is possible that this represents physiologic activity in a loop of bowel which moved between the PET and CT and position. Again, I do not observe a definite soft tissue mass in this vicinity along the ascites.  Aortoiliac atherosclerotic vascular disease. Prior Whipple.  SKELETON  No focal hypermetabolic activity to suggest skeletal metastasis.  IMPRESSION: 1. Extensive ascites. No compelling findings of peritoneal carcinomatosis despite this ascites. Reason focus of high activity just above the iliac crest to the right abdomen which does not have a definite CT correlate, and which I suspect probably represents a loop of bowel which moved between the PET images and the CT images. 2. Bibasilar atelectasis/scarring, standard uptake value below thresholds for malignancy suspicion. 3. Coronary artery atherosclerosis. 4. Mild paraseptal emphysema.   Electronically Signed  By: Van Clines M.D.  On: 05/03/2015 11:53   PATHOLOGY:   BONE MARROW REPORTAL DIAGNOSIS Diagnosis Bone Marrow, Aspirate,Biopsy, and Clot - NORMOCELLULAR MARROW WITH TRILINEAGE HEMATOPOIESIS. - NO METASTATIC CARCINOMA. - SEE COMMENT. PERIPHERAL BLOOD: - NORMOCYTIC ANEMIA. - MILD LEUKOCYTOSIS. Diagnosis Note The bone marrow is normocellular with trilineage hematopoiesis. There is no dysplasia in any lineage. There is no evidence of metastatic carcinoma. Plasma cells are not increased. Overall, there is no morphologic etiology  in the bone marrow for the patient's persistent anemia. Vicente Males MD Pathologist, Electronic Signature (Case signed 05/30/2015) GROSS AND MICROSCOPIC INFORMATION Specimen Clinical  ASSESSMENT/PLAN:  Anemia. Normal BMBX PE with bilateral LE DVT's, on anticoagulation History of Pancreatic Cancer, stage IIB Chronic abdominal pain post Whipple Elevated CA 19-9 in 12/15, ? significance all prior values after surgery WNL elevated pre-surgery Myeloma panel with small M-spike at 0.2 gm/dl, IgG monoclonal protein with lambda light chain specificity, normal kappa lamda light chain ratio Normal B12, folate Iron studies c/w anemia of chronic disease Hypoalbuminemia, elevated bilirubin, low total protein Mild elevation of ESR, elevated CRP Normal TSH Hemoccult positive X 1 Significant Weight Loss Cirrhosis/ascites History of alcohol use  Labs were reviewed with the patient  They are stable. His  leukocytosis is persistent, not monoclonal.   He continues with ongoing paracenteses. He follows with GI and will see Dr. Laural Golden tomorrow.   He has a history of pancreatic cancer back in 2010. He underwent surgical resection at [place]. He has a history of chronic abdominal pain since his Whipple procedure. He also remarks that his abdominal pain has become much more significant since the development of ascites.  Given his history of bilateral DVTs and PE, his elevated CA-19-9 in December 2015, and weight loss; when we first met the patient we worked him up for cancer recurrence with PET imaging and abdominal ultrasound it was not revealing for recurrent disease. With his liver disease I am uncertain of the significance of his CA-19-9 level.  He has been diagnosed with cirrhosis. He does have an alcohol history. At his last visit in July we discussed cirrhosis in detail and complete abstinence from alcohol. The patient is adamant today that he has not drank since that time.  Last INR was WNL, I  would prefer he continue on anticoagulation. I have recommended eliquis 2.5 mg bid. He was previously on lovenox but can no longer afford it.   I advised him to discontinue the aspirin. Therapeutic paracentesis today, at his request.  He requested pain medication refill after his visit today, on review of the [place] Controlled substance reporting system he received 80 oxycodone and 90 xanax from Dr. Marlou Sa in [place] on 08/29/2015 filled at [facility] in [place].  Oxycodone was refilled early. I will no longer prescribe narcotics and this was discussed with the patient.  All questions were answered. The patient knows to call the clinic with any problems, questions or concerns. We can certainly see the patient much sooner if necessary.  This document serves as a record of services personally performed by Ancil Linsey, MD. It was created on her behalf by Arlyce Harman, a trained medical scribe. The creation of this record is based on the scribe's personal observations and the provider's statements to them. This document has been checked and approved by the attending provider.  I have reviewed the above documentation for accuracy and completeness, and I agree with the above.  This note is electronically signed by: Kelby Fam. Fadia Marlar, MD  1:38 PM

## 2015-09-11 NOTE — Progress Notes (Signed)
Paracentesis complete no signs of distress. 4800 ml yellow colored ascites removed.

## 2015-09-12 ENCOUNTER — Encounter (INDEPENDENT_AMBULATORY_CARE_PROVIDER_SITE_OTHER): Payer: Self-pay | Admitting: Internal Medicine

## 2015-09-12 ENCOUNTER — Encounter (HOSPITAL_COMMUNITY): Payer: Self-pay | Admitting: Hematology & Oncology

## 2015-09-12 ENCOUNTER — Ambulatory Visit (INDEPENDENT_AMBULATORY_CARE_PROVIDER_SITE_OTHER): Payer: Medicare Other | Admitting: Internal Medicine

## 2015-09-12 VITALS — BP 88/64 | HR 96 | Temp 97.1°F | Resp 16 | Ht 72.0 in | Wt 130.6 lb

## 2015-09-12 DIAGNOSIS — E43 Unspecified severe protein-calorie malnutrition: Secondary | ICD-10-CM | POA: Diagnosis not present

## 2015-09-12 DIAGNOSIS — R188 Other ascites: Principal | ICD-10-CM

## 2015-09-12 DIAGNOSIS — D638 Anemia in other chronic diseases classified elsewhere: Secondary | ICD-10-CM | POA: Diagnosis not present

## 2015-09-12 DIAGNOSIS — K7460 Unspecified cirrhosis of liver: Secondary | ICD-10-CM

## 2015-09-12 NOTE — Progress Notes (Signed)
Presenting complaint;  Follow-up for refractory cirrhotic ascites.  Database: Patrick Lee is 54 year old Caucasian male who has complicated GI history. He had Whipple procedure in 2008 by Dr. Jyl Heinz of [facility]. 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 He was transferred to [facility] and underwent stenting with resolution of this process. In June 2013 he underwent thoracotomy with wedge resection of superior lung lesion and right lower lobectomy. It was adenocarcinoma. It was felt to be primary lung although metastatic disease was not entirely excluded. Earlier this year he was treated for DVT and PE. He was also evaluated by Dr. Whitney Muse for anemia and had bone marrow and was essentially normal. He presented with abdominal pain and noted to have ascites in May 2016. He was initially seen at [facility]. He had transjugular liver biopsy on 07/24/2015 revealing mixed macro and microvesicular steatosis numerous ballooned hepato-ascites and inflammatory foci. Focal parenchyma nodule formation consistent with early cirrhosis. Inflammation noted in portal areas. Trichrome stain revealed sinus oral and bridging fibrosis. Iron studies revealed a 4+ hemosiderin. 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 Patient has been transferred to our office for GI care and was initially seen on 08/20/2015.  Subjective: Patient feels much better today. He had abdominal paracenteses yesterday with removal of almost 5 L of fluid. He states yesterday he felt so good after this procedure that he had 2sandwiches and then had big supper. As his abdomen becomes distended he is unable to eat and he also develops nausea heartburn and regurgitation. He denies fever or confusion melena or rectal bleeding. He is on 2 g sodium diet. He has lost 39 pounds in the last 5 months. He  has required 12 taps between 04/05/2015 and yesterday. He has not had any alcohol in past few months. He states he used to drink socially but not every day. He does not feel that he has drinking problem. Family history is negative for liver disease. He had EGD in December 2015 at [facility] revealing altered upper GI tract but no evidence of varices. He had colonoscopy in May 2014 at [facility] in [facility] and was normal.    Current Medications: Outpatient Encounter Prescriptions as of 09/12/2015  Medication Sig  . ALPRAZolam (XANAX) 0.25 MG tablet Take 0.25 mg by mouth 3 (three) times daily as needed.   Marland Kitchen aspirin 325 MG EC tablet Take 325 mg by mouth daily.  . feeding supplement, ENSURE ENLIVE, (ENSURE ENLIVE) LIQD Take 237 mLs by mouth 2 (two) times daily between meals.  . furosemide (LASIX) 40 MG tablet Take 1 tablet (40 mg total) by mouth daily.  . ondansetron (ZOFRAN) 4 MG tablet Take 1 tablet (4 mg total) by mouth 4 (four) times daily -  with meals and at bedtime.  Marland Kitchen OVER THE COUNTER MEDICATION Apply 1 application topically as needed (for hemorrhoids). OTC Hemorrhoid cream  . Oxycodone HCl 10 MG TABS Take 1 tablet (10 mg total) by mouth every 4 (four) hours.  Marland Kitchen spironolactone (ALDACTONE) 50 MG tablet Take 2 tablets (100 mg total) by mouth daily.  Marland Kitchen apixaban (ELIQUIS) 2.5 MG TABS tablet Take 1 tablet (2.5 mg total) by mouth 2 (two) times daily. (Patient not taking: Reported on 09/12/2015)  . enoxaparin (LOVENOX) 100 MG/ML injection Inject 1 mL (100 mg total) into the skin daily. (Patient not taking: Reported  on 08/23/2015)  . [DISCONTINUED] acetaminophen (TYLENOL) 500 MG tablet Take 500 mg by mouth every 6 (six) hours as needed for mild pain or moderate pain.  . [DISCONTINUED] aspirin 325 MG EC tablet Take 650 mg by mouth daily. Taking 2 tablets daily  . [DISCONTINUED] aspirin 81 MG tablet Take 81 mg by mouth daily.  . [DISCONTINUED] furosemide (LASIX) 20 MG tablet Take 20 mg by mouth daily.    . [DISCONTINUED] spironolactone (ALDACTONE) 25 MG tablet Take 2 tablets (50 mg total) by mouth daily. With lasix (Patient not taking: Reported on 09/11/2015)   No facility-administered encounter medications on file as of 09/12/2015.     Objective: Blood pressure 88/64, pulse 96, temperature 97.1 F (36.2 C), temperature source Oral, resp. rate 16, height 6' (1.829 m), weight 130 lb 9.6 oz (59.24 kg). Patient is thin and a messy aided. He does not have asterixis. Conjunctiva is pink. Sclera is nonicteric Oropharyngeal mucosa is normal. No neck masses or thyromegaly noted. Cardiac exam with regular rhythm normal S1 and S2. No murmur or gallop noted. Lungs are clear to auscultation. Abdomen is full but not tense or tender. Liver edge is firm below RCM. Spleen is not palpable. He has 1+ pitting edema involving both legs.  Labs/studies Results: Lab data from 08/31/2015  AFP 2.3  Serum sodium 133 potassium 4.5 chloride 104 CO2 23 glucose 1:30 BUN 24, creatinine 0.93 Bilirubin 0.5, AP 118, AST 22, ALT 15, total protein 4.4 and albumin 1.6. Calcium 7.4.   Assessment:  #1. Decompensated cirrhosis with ascites requiring weekly abdminal paracnteses. 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 There is also a question of hemochromatosis but serum iron and ferritin are not supportive of this diagnosis. He has history of hepaticojejunostomy stricture and therefore also at risk for secondary biliary cirrhosis. I will have pathologist review biopsy slides and see if he can get more information. Refractory ascites. He is requiring abdominal tap every week. He may be candidate for TIPs. He unfortunately is not a candidate for liver transplant given history of adenocarcinoma of lung diagnosed and treated about 3 and half years ago. #2.  Severe malnutrition secondary to advanced cirrosis.    Plan:  Continue low-salt diet. Continue fursemide and  spiroolactone at current dose.  Patient will call when ascites bcomes tese.  serum ammonia and pe-albumin. MR of liver as planned. Office visit in 2-3 weeks.

## 2015-09-12 NOTE — Patient Instructions (Signed)
High-protein low-salt diet. Physician will call with results of blood tests when completed. Call when abdomen is tense again

## 2015-09-15 ENCOUNTER — Encounter (HOSPITAL_COMMUNITY): Payer: Self-pay

## 2015-09-15 ENCOUNTER — Ambulatory Visit (HOSPITAL_COMMUNITY)
Admission: RE | Admit: 2015-09-15 | Discharge: 2015-09-15 | Disposition: A | Discharge status: Home / Self Care | Payer: Medicare Other | Source: Ambulatory Visit | Attending: Internal Medicine | Admitting: Internal Medicine

## 2015-09-15 ENCOUNTER — Telehealth (INDEPENDENT_AMBULATORY_CARE_PROVIDER_SITE_OTHER): Payer: Self-pay | Admitting: *Deleted

## 2015-09-15 ENCOUNTER — Other Ambulatory Visit (INDEPENDENT_AMBULATORY_CARE_PROVIDER_SITE_OTHER): Payer: Self-pay | Admitting: Internal Medicine

## 2015-09-15 DIAGNOSIS — R188 Other ascites: Secondary | ICD-10-CM | POA: Diagnosis not present

## 2015-09-15 LAB — BODY FLUID CELL COUNT WITH DIFFERENTIAL
EOS FL: 0 %
LYMPHS FL: 42 %
Monocyte-Macrophage-Serous Fluid: 2 % — ABNORMAL LOW (ref 50–90)
Neutrophil Count, Fluid: 54 % — ABNORMAL HIGH (ref 0–25)
OTHER CELLS FL: 0 %
Total Nucleated Cell Count, Fluid: 81 cu mm (ref 0–1000)

## 2015-09-15 LAB — GRAM STAIN

## 2015-09-15 MED ORDER — ALBUMIN HUMAN 25 % IV SOLN
50.0000 g | Freq: Once | INTRAVENOUS | Status: AC
  Administered 2015-09-15: 50 g via INTRAVENOUS

## 2015-09-15 NOTE — Telephone Encounter (Signed)
He needs fluid pulled off before the weekend. His return phone number is [phone].

## 2015-09-15 NOTE — Telephone Encounter (Signed)
Has been scheduled.

## 2015-09-15 NOTE — Procedures (Signed)
PreOperative Dx: Ascites Postoperative Dx: Cirrhosis, ascites Procedure:   US guided paracentesis Radiologist:  Thornton Papas Anesthesia:  10 ml of 1% lidocaine Specimen:  5.18L of light yellow ascitic fluid EBL:   < 1 ml Complications: None

## 2015-09-18 LAB — PATHOLOGIST SMEAR REVIEW

## 2015-09-19 ENCOUNTER — Telehealth (INDEPENDENT_AMBULATORY_CARE_PROVIDER_SITE_OTHER): Payer: Self-pay | Admitting: *Deleted

## 2015-09-19 DIAGNOSIS — R188 Other ascites: Secondary | ICD-10-CM

## 2015-09-19 NOTE — Telephone Encounter (Signed)
LM stating he needed to have fluid drawn off. Would also like to speak with Terri. The return phone number is [phone].

## 2015-09-19 NOTE — Telephone Encounter (Signed)
abd tap sch'd 09/20/15 at 130, patient aware

## 2015-09-20 ENCOUNTER — Ambulatory Visit (HOSPITAL_COMMUNITY)
Admission: RE | Admit: 2015-09-20 | Discharge: 2015-09-20 | Disposition: A | Discharge status: Home / Self Care | Payer: Medicare Other | Source: Ambulatory Visit | Attending: Internal Medicine | Admitting: Internal Medicine

## 2015-09-20 DIAGNOSIS — K7031 Alcoholic cirrhosis of liver with ascites: Secondary | ICD-10-CM | POA: Insufficient documentation

## 2015-09-20 DIAGNOSIS — Z85118 Personal history of other malignant neoplasm of bronchus and lung: Secondary | ICD-10-CM | POA: Insufficient documentation

## 2015-09-20 DIAGNOSIS — Z87891 Personal history of nicotine dependence: Secondary | ICD-10-CM | POA: Insufficient documentation

## 2015-09-20 DIAGNOSIS — R634 Abnormal weight loss: Secondary | ICD-10-CM | POA: Diagnosis not present

## 2015-09-20 DIAGNOSIS — Z8507 Personal history of malignant neoplasm of pancreas: Secondary | ICD-10-CM | POA: Diagnosis not present

## 2015-09-20 DIAGNOSIS — R188 Other ascites: Secondary | ICD-10-CM

## 2015-09-20 LAB — CULTURE, BODY FLUID-BOTTLE: CULTURE: NO GROWTH

## 2015-09-20 LAB — GRAM STAIN

## 2015-09-20 LAB — BODY FLUID CELL COUNT WITH DIFFERENTIAL
EOS FL: 0 %
LYMPHS FL: 17 %
MONOCYTE-MACROPHAGE-SEROUS FLUID: 32 % — AB (ref 50–90)
NEUTROPHIL FLUID: 51 % — AB (ref 0–25)
Total Nucleated Cell Count, Fluid: 115 cu mm (ref 0–1000)

## 2015-09-20 LAB — CULTURE, BODY FLUID W GRAM STAIN -BOTTLE

## 2015-09-20 MED ORDER — GADOXETATE DISODIUM 0.25 MMOL/ML IV SOLN
6.0000 mL | Freq: Once | INTRAVENOUS | Status: AC | PRN
  Administered 2015-09-20: 6 mL via INTRAVENOUS

## 2015-09-20 NOTE — Procedures (Signed)
PreOperative Dx: Cirrhosis, ascites Postoperative Dx: Cirrhosis, ascites Procedure:   US guided paracentesis Radiologist:  Thornton Papas Anesthesia:  10 ml of 1% lidocaine Specimen:  4700 ml of light yellow ascitic fluid EBL:   < 1 ml Complications: None

## 2015-09-21 LAB — PATHOLOGIST SMEAR REVIEW

## 2015-09-21 LAB — PREALBUMIN: Prealbumin: 6 mg/dL — ABNORMAL LOW (ref 21–43)

## 2015-09-21 LAB — AMMONIA: AMMONIA: 36 umol/L (ref 16–53)

## 2015-09-25 ENCOUNTER — Telehealth (INDEPENDENT_AMBULATORY_CARE_PROVIDER_SITE_OTHER): Payer: Self-pay | Admitting: *Deleted

## 2015-09-25 ENCOUNTER — Other Ambulatory Visit (INDEPENDENT_AMBULATORY_CARE_PROVIDER_SITE_OTHER): Payer: Self-pay | Admitting: Internal Medicine

## 2015-09-25 ENCOUNTER — Telehealth (HOSPITAL_COMMUNITY): Payer: Self-pay | Admitting: *Deleted

## 2015-09-25 DIAGNOSIS — R188 Other ascites: Secondary | ICD-10-CM

## 2015-09-25 LAB — CULTURE, BODY FLUID W GRAM STAIN -BOTTLE: Culture: NO GROWTH

## 2015-09-25 LAB — CULTURE, BODY FLUID-BOTTLE

## 2015-09-25 NOTE — Telephone Encounter (Signed)
Needs a Tap. The return phone number is [phone].

## 2015-09-25 NOTE — Telephone Encounter (Signed)
[place] para. ordered

## 2015-09-25 NOTE — Progress Notes (Signed)
abd tap sch'd 09/26/15 at 100, patient aware

## 2015-09-26 ENCOUNTER — Emergency Department (HOSPITAL_COMMUNITY)
Admission: EM | Admit: 2015-09-26 | Discharge: 2015-09-26 | Disposition: A | Discharge status: Home / Self Care | Payer: Medicare Other | Attending: Emergency Medicine | Admitting: Emergency Medicine

## 2015-09-26 ENCOUNTER — Emergency Department (HOSPITAL_COMMUNITY): Payer: Medicare Other

## 2015-09-26 ENCOUNTER — Ambulatory Visit (HOSPITAL_COMMUNITY): Payer: Medicare Other

## 2015-09-26 ENCOUNTER — Encounter (HOSPITAL_COMMUNITY): Payer: Self-pay | Admitting: Emergency Medicine

## 2015-09-26 DIAGNOSIS — Z7901 Long term (current) use of anticoagulants: Secondary | ICD-10-CM | POA: Diagnosis not present

## 2015-09-26 DIAGNOSIS — Z8719 Personal history of other diseases of the digestive system: Secondary | ICD-10-CM | POA: Insufficient documentation

## 2015-09-26 DIAGNOSIS — R0602 Shortness of breath: Secondary | ICD-10-CM | POA: Insufficient documentation

## 2015-09-26 DIAGNOSIS — F419 Anxiety disorder, unspecified: Secondary | ICD-10-CM | POA: Insufficient documentation

## 2015-09-26 DIAGNOSIS — Z9049 Acquired absence of other specified parts of digestive tract: Secondary | ICD-10-CM | POA: Insufficient documentation

## 2015-09-26 DIAGNOSIS — Z87891 Personal history of nicotine dependence: Secondary | ICD-10-CM | POA: Diagnosis not present

## 2015-09-26 DIAGNOSIS — I252 Old myocardial infarction: Secondary | ICD-10-CM | POA: Insufficient documentation

## 2015-09-26 DIAGNOSIS — Z8739 Personal history of other diseases of the musculoskeletal system and connective tissue: Secondary | ICD-10-CM | POA: Diagnosis not present

## 2015-09-26 DIAGNOSIS — Z7982 Long term (current) use of aspirin: Secondary | ICD-10-CM | POA: Insufficient documentation

## 2015-09-26 DIAGNOSIS — Z8507 Personal history of malignant neoplasm of pancreas: Secondary | ICD-10-CM | POA: Insufficient documentation

## 2015-09-26 DIAGNOSIS — R1084 Generalized abdominal pain: Secondary | ICD-10-CM | POA: Diagnosis present

## 2015-09-26 DIAGNOSIS — Z79899 Other long term (current) drug therapy: Secondary | ICD-10-CM | POA: Insufficient documentation

## 2015-09-26 DIAGNOSIS — Z85118 Personal history of other malignant neoplasm of bronchus and lung: Secondary | ICD-10-CM | POA: Diagnosis not present

## 2015-09-26 DIAGNOSIS — R188 Other ascites: Secondary | ICD-10-CM | POA: Diagnosis not present

## 2015-09-26 DIAGNOSIS — R1011 Right upper quadrant pain: Secondary | ICD-10-CM

## 2015-09-26 LAB — CBC WITH DIFFERENTIAL/PLATELET
BASOS PCT: 1 %
Basophils Absolute: 0.1 10*3/uL (ref 0.0–0.1)
EOS ABS: 0.1 10*3/uL (ref 0.0–0.7)
Eosinophils Relative: 1 %
HCT: 30.6 % — ABNORMAL LOW (ref 39.0–52.0)
HEMOGLOBIN: 10.7 g/dL — AB (ref 13.0–17.0)
Lymphocytes Relative: 16 %
Lymphs Abs: 2.1 10*3/uL (ref 0.7–4.0)
MCH: 35.7 pg — ABNORMAL HIGH (ref 26.0–34.0)
MCHC: 35 g/dL (ref 30.0–36.0)
MCV: 102 fL — ABNORMAL HIGH (ref 78.0–100.0)
Monocytes Absolute: 0.6 10*3/uL (ref 0.1–1.0)
Monocytes Relative: 5 %
NEUTROS PCT: 77 %
Neutro Abs: 9.9 10*3/uL — ABNORMAL HIGH (ref 1.7–7.7)
PLATELETS: 320 10*3/uL (ref 150–400)
RBC: 3 MIL/uL — AB (ref 4.22–5.81)
RDW: 13.6 % (ref 11.5–15.5)
WBC: 12.8 10*3/uL — AB (ref 4.0–10.5)

## 2015-09-26 LAB — COMPREHENSIVE METABOLIC PANEL
ALK PHOS: 115 U/L (ref 38–126)
ALT: 19 U/L (ref 17–63)
ANION GAP: 5 (ref 5–15)
AST: 26 U/L (ref 15–41)
Albumin: 2.1 g/dL — ABNORMAL LOW (ref 3.5–5.0)
BILIRUBIN TOTAL: 0.3 mg/dL (ref 0.3–1.2)
BUN: 27 mg/dL — ABNORMAL HIGH (ref 6–20)
CALCIUM: 7.7 mg/dL — AB (ref 8.9–10.3)
CO2: 25 mmol/L (ref 22–32)
Chloride: 103 mmol/L (ref 101–111)
Creatinine, Ser: 0.97 mg/dL (ref 0.61–1.24)
Glucose, Bld: 96 mg/dL (ref 65–99)
Potassium: 4.2 mmol/L (ref 3.5–5.1)
Sodium: 133 mmol/L — ABNORMAL LOW (ref 135–145)
TOTAL PROTEIN: 4.9 g/dL — AB (ref 6.5–8.1)

## 2015-09-26 LAB — GRAM STAIN

## 2015-09-26 LAB — BODY FLUID CELL COUNT WITH DIFFERENTIAL
Eos, Fluid: 0 %
Lymphs, Fluid: 26 %
MONOCYTE-MACROPHAGE-SEROUS FLUID: 21 % — AB (ref 50–90)
Neutrophil Count, Fluid: 53 % — ABNORMAL HIGH (ref 0–25)
Total Nucleated Cell Count, Fluid: 84 cu mm (ref 0–1000)

## 2015-09-26 LAB — PROTEIN, BODY FLUID

## 2015-09-26 LAB — GLUCOSE, SEROUS FLUID: Glucose, Fluid: 126 mg/dL

## 2015-09-26 LAB — ETHANOL: Alcohol, Ethyl (B): <5 mg/dL (ref <5)

## 2015-09-26 LAB — LACTATE DEHYDROGENASE, PLEURAL OR PERITONEAL FLUID: LD, Fluid: 21 U/L (ref 3–23)

## 2015-09-26 LAB — PROTIME-INR
INR: 1.06 (ref 0.00–1.49)
PROTHROMBIN TIME: 14 s (ref 11.6–15.2)

## 2015-09-26 LAB — ALBUMIN, FLUID (OTHER): Albumin, Fluid: <1 g/dL

## 2015-09-26 MED ORDER — OXYCODONE HCL 10 MG PO TABS: 10.0000 mg | ORAL_TABLET | Freq: Four times a day (QID) | ORAL | Status: DC | PRN

## 2015-09-26 MED ORDER — OXYCODONE HCL 5 MG PO TABS
10.0000 mg | ORAL_TABLET | Freq: Once | ORAL | Status: AC
  Administered 2015-09-26: 10 mg via ORAL
  Filled 2015-09-26: qty 2

## 2015-09-26 NOTE — ED Notes (Signed)
Pt c/o SOB and RUQ abdominal pain. Pt last seen here 2 weeks ago and US paracentesis done.

## 2015-09-26 NOTE — ED Notes (Signed)
Patient BP 88/70, Dr. Thurnell Garbe informed.  Pain medication administration ok per Dr. Thurnell Garbe.

## 2015-09-26 NOTE — ED Notes (Signed)
Offered wheelchair for discharge. Refused.

## 2015-09-26 NOTE — Telephone Encounter (Signed)
Message left for patient.  To call back with any questions.

## 2015-09-26 NOTE — ED Provider Notes (Signed)
CSN: [id number]     Arrival date & time 09/26/15  1558 History   First MD Initiated Contact with Patient 09/26/15 1636     Chief Complaint  Patient presents with  . Shortness of Breath      HPI Pt was seen at 1610.  Per pt, c/o gradual onset and persistence of constant generalized abd "pain" since yesterday.  Has been associated with multiple intermittent episodes of N/V, decreased PO intake and SOB. Describes the abd pain as "I need fluid drawn off."  Pt states he has hx of liver cirrhosis with frequent paracentesis due to ascites.  Denies diarrhea, no fevers, no back pain, no rash, no CP/SOB, no black or blood in stools or emesis.       Past Medical History  Diagnosis Date  . Lateral epicondylitis   . Chest pain, unspecified   . Abdominal pain, other specified site   . Obstruction of bile duct   . Tobacco use disorder   . Anxiety   . Shortness of breath     uses oxygen at night- 2 liters   . Myocardial infarction [facility]) 2007    during [place]- 2007  . Complication of anesthesia     pt. reports that he had MI while under anesth. for Whipple procedure  . Pancreatic cancer ([place])      status post Whipple procedure.  . Lung cancer [facility])    Past Surgical History  Procedure Laterality Date  . Whipple procedure  2007  . Percutaneous extraction of gallstones      [facility]  . Knee arthroscopy      R knee- at [facility]  . Cholecystectomy      open  . Video bronchoscopy  04/08/2012    Procedure: VIDEO BRONCHOSCOPY;  Surgeon: Gaye Pollack, MD;  [facility];  Service: Thoracic;  Laterality: N/A;  . Partial right lung removal    . Colonoscopy  May 2014    Dr. Anthony Sar: normal   . Esophagogastroduodenoscopy  Dec 2015    [facility]. normal esophagus, patent afferent and efferent libs, no abnormalities in jejunum.   . Bone marrow biopsy Left 05/25/15  . Bone marrow aspiration Left 05/25/15   Family History  Problem Relation Age of Onset  . Stroke  Father 46    Deceased  . Anesthesia problems Neg Hx   . Colon cancer Neg Hx   . Cirrhosis Neg Hx    Social History  Substance Use Topics  . Smoking status: Former Smoker -- 1.00 packs/day for 28 years    Types: Cigarettes    Quit date: 01/02/2012  . Smokeless tobacco: Never Used  . Alcohol Use: No     Comment: last drink was 6 months ago per patient.    Review of Systems ROS: Statement: All systems negative except as marked or noted in the HPI; Constitutional: Negative for fever and chills. ; ; Eyes: Negative for eye pain, redness and discharge. ; ; ENMT: Negative for ear pain, hoarseness, nasal congestion, sinus pressure and sore throat. ; ; Cardiovascular: Negative for chest pain, palpitations, diaphoresis, and peripheral edema. ; ; Respiratory: +SOB. Negative for cough, wheezing and stridor. ; ; Gastrointestinal: +N/V, abd pain. Negative for diarrhea, blood in stool, hematemesis, jaundice and rectal bleeding. . ; ; Genitourinary: Negative for dysuria, flank pain and hematuria. ; ; Musculoskeletal: Negative for back pain and neck pain. Negative for swelling and trauma.; ; Skin: Negative for pruritus, rash, abrasions, blisters,  bruising and skin lesion.; ; Neuro: Negative for headache, lightheadedness and neck stiffness. Negative for weakness, altered level of consciousness , altered mental status, extremity weakness, paresthesias, involuntary movement, seizure and syncope.      Allergies  Review of patient's allergies indicates no known allergies.  Home Medications   Prior to Admission medications   Medication Sig Start Date End Date Taking? Authorizing Provider  ALPRAZolam (XANAX) 0.25 MG tablet Take 0.25 mg by mouth 3 (three) times daily as needed.  08/29/15   Historical Provider, MD  apixaban (ELIQUIS) 2.5 MG TABS tablet Take 1 tablet (2.5 mg total) by mouth 2 (two) times daily. Patient not taking: Reported on 09/12/2015 09/11/15   Patrici Ranks, MD  aspirin 325 MG EC tablet  Take 325 mg by mouth daily.    Historical Provider, MD  feeding supplement, ENSURE ENLIVE, (ENSURE ENLIVE) LIQD Take 237 mLs by mouth 2 (two) times daily between meals. 04/23/15   Maryann Mikhail, DO  furosemide (LASIX) 40 MG tablet Take 1 tablet (40 mg total) by mouth daily. 08/10/15   Butch Penny, NP  ondansetron (ZOFRAN) 4 MG tablet Take 1 tablet (4 mg total) by mouth 4 (four) times daily -  with meals and at bedtime. 06/20/15   Orvil Feil, NP  OVER THE COUNTER MEDICATION Apply 1 application topically as needed (for hemorrhoids). OTC Hemorrhoid cream    Historical Provider, MD  Oxycodone HCl 10 MG TABS Take 1 tablet (10 mg total) by mouth every 4 (four) hours. 08/08/15   Patrici Ranks, MD  spironolactone (ALDACTONE) 50 MG tablet Take 2 tablets (100 mg total) by mouth daily. 08/10/15   Rona Ravens Setzer, NP   BP 96/50 mmHg  Pulse 118  Temp(Src) 97.5 F (36.4 C) (Oral)  Resp 18  Ht 6' (1.829 m)  Wt 129 lb (58.514 kg)  BMI 17.49 kg/m2  SpO2 100% Physical Exam  1615: Physical examination:  Nursing notes reviewed; Vital signs and O2 SAT reviewed;  Constitutional: Thin, frail. Well hydrated, Tearful; Head:  Normocephalic, atraumatic; Eyes: EOMI, PERRL, No scleral icterus; ENMT: Mouth and pharynx normal, Mucous membranes moist; Neck: Supple, Full range of motion, No lymphadenopathy; Cardiovascular: Regular rate and rhythm, No gallop; Respiratory: Breath sounds coarse & equal bilaterally, No wheezes.  Speaking full sentences with ease, Normal respiratory effort/excursion; Chest: Nontender, Movement normal; Abdomen: Soft, +RUQ tenderness to palp, +distended, No rebound or guarding. Normal bowel sounds; Genitourinary: No CVA tenderness; Extremities: Pulses normal, No tenderness, No edema, No calf edema or asymmetry.; Neuro: AA&Ox3, Major CN grossly intact.  Speech clear. No gross focal motor or sensory deficits in extremities.; Skin: Color pale, Warm, Dry.; Psych:  Tearful.    ED Course  Procedures  (including critical care time) Labs Review   Imaging Review  I have personally reviewed and evaluated these images and lab results as part of my medical decision-making.   EKG Interpretation None      MDM  MDM Reviewed: previous chart, nursing note and vitals Reviewed previous: labs and ultrasound Interpretation: labs and ultrasound      Results for orders placed or performed during the hospital encounter of 09/26/15  Gram stain  Result Value Ref Range   Specimen Description FLUID ASCITIC COLLECTED BY DOCTOR DOVER    Special Requests NONE    Gram Stain      CYTOSPIN SMEAR WBC PRESENT,BOTH PMN AND MONONUCLEAR NO ORGANISMS SEEN Performed at [facility]    Report Status 09/26/2015 FINAL  Comprehensive metabolic panel  Result Value Ref Range   Sodium 133 (L) 135 - 145 mmol/L   Potassium 4.2 3.5 - 5.1 mmol/L   Chloride 103 101 - 111 mmol/L   CO2 25 22 - 32 mmol/L   Glucose, Bld 96 65 - 99 mg/dL   BUN 27 (H) 6 - 20 mg/dL   Creatinine, Ser 0.97 0.61 - 1.24 mg/dL   Calcium 7.7 (L) 8.9 - 10.3 mg/dL   Total Protein 4.9 (L) 6.5 - 8.1 g/dL   Albumin 2.1 (L) 3.5 - 5.0 g/dL   AST 26 15 - 41 U/L   ALT 19 17 - 63 U/L   Alkaline Phosphatase 115 38 - 126 U/L   Total Bilirubin 0.3 0.3 - 1.2 mg/dL   GFR calc non Af Amer >60 >60 mL/min   GFR calc Af Amer >60 >60 mL/min   Anion gap 5 5 - 15  Ethanol  Result Value Ref Range   Alcohol, Ethyl (B) <5 <5 mg/dL  CBC with Differential  Result Value Ref Range   WBC 12.8 (H) 4.0 - 10.5 K/uL   RBC 3.00 (L) 4.22 - 5.81 MIL/uL   Hemoglobin 10.7 (L) 13.0 - 17.0 g/dL   HCT 30.6 (L) 39.0 - 52.0 %   MCV 102.0 (H) 78.0 - 100.0 fL   MCH 35.7 (H) 26.0 - 34.0 pg   MCHC 35.0 30.0 - 36.0 g/dL   RDW 13.6 11.5 - 15.5 %   Platelets 320 150 - 400 K/uL   Neutrophils Relative % 77 %   Neutro Abs 9.9 (H) 1.7 - 7.7 K/uL   Lymphocytes Relative 16 %   Lymphs Abs 2.1 0.7 - 4.0 K/uL   Monocytes Relative 5 %   Monocytes Absolute 0.6 0.1 - 1.0  K/uL   Eosinophils Relative 1 %   Eosinophils Absolute 0.1 0.0 - 0.7 K/uL   Basophils Relative 1 %   Basophils Absolute 0.1 0.0 - 0.1 K/uL  Protime-INR  Result Value Ref Range   Prothrombin Time 14.0 11.6 - 15.2 seconds   INR 1.06 0.00 - 1.49  Body fluid cell count with differential  Result Value Ref Range   Fluid Type-FCT ASCITIC    Color, Fluid STRAW (A) YELLOW   Appearance, Fluid CLEAR (A) CLEAR   WBC, Fluid 84 0 - 1000 cu mm   Neutrophil Count, Fluid 53 (H) 0 - 25 %   Lymphs, Fluid 26 %   Monocyte-Macrophage-Serous Fluid 21 (L) 50 - 90 %   Eos, Fluid 0 %   Other Cells, Fluid MODERATE %  Lactate dehydrogenase (CSF, pleural or peritoneal fluid)  Result Value Ref Range   LD, Fluid 21 3 - 23 U/L   Fluid Type-FLDH ASCITIC   Protein, fluid - pleural or peritoneal  Result Value Ref Range   Total protein, fluid <3.0 g/dL   Fluid Type-FTP ASCITIC   Glucose, pleural or peritoneal fluid  Result Value Ref Range   Glucose, Fluid 126 mg/dL   Fluid Type-FGLU ASCITIC   Protein, fluid - pleural or peritoneal  Result Value Ref Range   Total protein, fluid <3.0 g/dL   Fluid Type-FTP ASCITIC   Albumin fluid, pleural or peritoneal  Result Value Ref Range   Albumin, Fluid <1.0 g/dL   Fluid Type-FALB ASCITIC      1941:  Pt now reveals that his Heme/Onc MD "won't write me for any more oxycodone" for his chronic abd pain, and he has run out. Pt is requesting a  refill. EPIC chart reviewed: pt apparently violated pain contract with MD. Pt aware ED is not the appropriate venue to receive chronic pain prescriptions and he will need to discuss this with his PMD and/or GI MD. Pt verb understanding. Pt given dose of his usual pain meds while in the ED today. BP per baseline. Workup reassuring. T/C to GI Dr. Oneida Alar, case discussed, including:  HPI, pertinent PM/SHx, VS/PE, dx testing, ED course and treatment: agrees workup is reassuring, no SBP, tx pain symptomatically with very limited supply of pain  meds until is able to f/u with GI MD. Dx and testing, as well as d/w GI MD, d/w pt.  Questions answered.  Verb understanding, agreeable to d/c home with outpt f/u.      Francine Graven, DO 09/28/15 2053

## 2015-09-26 NOTE — ED Notes (Addendum)
EDP talked with patient in triage. EDP reported would talk with GI. Pt moved to waiting room behind triage for update.

## 2015-09-26 NOTE — Telephone Encounter (Signed)
Dr. Whitney Muse was very clear, and it is documented in her note, that we are not prescribing any pain medications as he has multiple providers prescribing pain medication(s).  He was warned at the beginning that he was only to have one provider prescribe narcotics.  The [place] database was searched and he has someone else prescribing pain medication.  Thus, we will not.  Advise him that he is not to call us ANYMORE for pain medication refills. No exceptions.   Fran Mcree, PA-C 09/26/2015 8:04 AM

## 2015-09-26 NOTE — Discharge Instructions (Signed)
°Emergency Department Resource Guide °1) Find a Doctor and Pay Out of Pocket °Although you won't have to find out who is covered by your insurance plan, it is a good idea to ask around and get recommendations. You will then need to call the office and see if the doctor you have chosen will accept you as a new patient and what types of options they offer for patients who are self-pay. Some doctors offer discounts or will set up payment plans for their patients who do not have insurance, but you will need to ask so you aren't surprised when you get to your appointment. ° °2) Contact Your Local Health Department °Not all health departments have doctors that can see patients for sick visits, but many do, so it is worth a call to see if yours does. If you don't know where your local health department is, you can check in your phone book. The CDC also has a tool to help you locate your state's health department, and many state websites also have listings of all of their local health departments. ° °3) Find a Walk-in Clinic °If your illness is not likely to be very severe or complicated, you may want to try a walk in clinic. These are popping up all over the country in pharmacies, drugstores, and shopping centers. They're usually staffed by nurse practitioners or physician assistants that have been trained to treat common illnesses and complaints. They're usually fairly quick and inexpensive. However, if you have serious medical issues or chronic medical problems, these are probably not your best option. ° °No Primary Care Doctor: °- Call Health Connect at  832-8000 - they can help you locate a primary care doctor that  accepts your insurance, provides certain services, etc. °- Physician Referral Service- 1-800-533-3463 ° °Chronic Pain Problems: °Organization         Address  Phone   Notes  °[facility]  ([phone]) [phone] Patients need to be referred by their primary care doctor.  ° °Medication  Assistance: °Organization         Address  Phone   Notes  °[place] [facility] [address] °[place], [place] [address] (336) 641-8030 --Must be a resident of [place] °-- Must have NO insurance coverage whatsoever (no Medicaid/ Medicare, etc.) °-- The pt. MUST have a primary care doctor that directs their care regularly and follows them in the community °  °[facility]  (866) 331-1348   °[employer]  (888) 892-1162   ° °Agencies that provide inexpensive medical care: °Organization         Address  Phone   Notes  °[facility]  (336) 832-8035   ° Internal Medicine    (336) 832-7272   °[facility] [address] °[place], [place] [address] (336) 832-4777   °[facility] [address], °[place] (336) 271-4999   °[facility]    (336) 373-0678   °[facility]    (336) 272-1050   °[facility] ° [address], [place] Phone:  (336) 832-4444, Fax:  (336) 832-4440 Hours of Operation:  9 am - 6 pm, M-F.  Also accepts Medicaid/Medicare and self-pay.  °[facility] ° [address], [place] Phone: (336) [phone], Fax: (336) [phone]. Hours of Operation:  8:30 am - 5:30 pm, M-F.  Also accepts Medicaid and self-pay.  °[facility] [place] [address], [place] Phone: (336) 878-6027   °[facility] [address], [place], [place] (336)723-1848, Ext. 123 Mondays & Thursdays: 7-9 AM.  First 15 patients are seen on a first come, first serve basis. °  ° °Medicaid-accepting [place] Providers: ° °Organization         Address  Phone   Notes  °[facility] [address], [place] (336) 641-2100 Also accepts self-pay patients.  °[facility] [address], [place] ° (336) 856-9996   °[facility] [address], [place]  (336) 288-8857   °[facility] [address] [place] [address], [place] (336) 299-7000   °Veita Bland [address], [place]  ° (336) [phone] Only accepts [place] Access Medicaid patients after they have their name applied to their card.  ° °Self-Pay (no insurance) in [place]: ° °Organization         Address  Phone   Notes  °Sickle Cell Patients, [facility] [address], [place] (336) 832-1970   °[facility] [address], [place] (336) 832-4400   °[facility] ° [address], [place] (336) 992-4800   °[facility]/Dr. Osei-Bonsu ° [address] [place] [address], [place] or [address], [place] (336) [phone] Phone number for both [place] and [place] locations is the same.  °[facility] [address], [place] (336) 299-0000   °[facility] [address] [place] [address], [place] or [address] (336) 852-7530 °(336) 878-2260   °[facility] [address], [place] (336) 350-1642, phone; (336) 294-5005, fax Sees patients 1st and 3rd Saturday of every month.  Must not qualify for public or private insurance (i.e. Medicaid, Medicare, [place] Health Choice, Veterans' Benefits) • Household income should be no more than 200% of the poverty level •The clinic cannot treat you if you are pregnant or think you are pregnant • Sexually transmitted diseases are not treated at the clinic.  ° ° °Dental Care: °Organization         Address  Phone  Notes  °[place] [facility] [address], [place] (336) 641-6152 Accepts children up to age 21 who are enrolled in Medicaid or [place] Health Choice; pregnant women with a Medicaid card; and children who have applied for Medicaid or [place] Health Choice, but were declined, whose parents can pay a reduced fee at time of service.  °[place]  [facility] [place]  [address], [place] (336) 641-7733 Accepts children up to age 21 who are enrolled in Medicaid or [place] Health Choice; pregnant women with a Medicaid card; and children who have applied for Medicaid or [place] Health Choice, but were declined, whose parents can pay a reduced fee at time of service.  °[facility] ° [address],  (336) 641-4533 Patients are seen by appointment only. Walk-ins are not accepted. [facility] will see patients 18 years of age and older. °Monday - Tuesday (8am-5pm) °Most Wednesdays (8:30-5pm) °$30 per visit, cash only  °[facility] ° [address], [place] (336) 641-4533 Patients are seen by appointment only. Walk-ins are not accepted. [facility] will see patients 18 years of age and older. °One   Wednesday Evening (Monthly: Volunteer Based).  $30 per visit, cash only  °[facility]  (919) 537-3737 for adults; Children under age 4, call Graduate Pediatric Dentistry at (919) [phone]. Children aged 4-14, please call (919) [phone] to request a pediatric application. ° Dental services are provided in all areas of dental care including fillings, crowns and bridges, complete and partial dentures, implants, gum treatment, root canals, and extractions. Preventive care is also provided. Treatment is provided to both adults and children. °Patients are selected via a lottery and there is often a waiting list. °  °[facility] [address], °[place] ° (336) 763-8833 [email or web address] °  °[facility] [address], [place], [place] (336)723-1848, Ext. 123 Second and Fourth Thursday of each month, opens at 6:30 AM; Clinic ends at 9 AM.  Patients are seen on a first-come first-served basis, and a limited number are seen during each clinic.  ° °[facility] ° [address], [place], [place] (336) 723-7904    Eligibility Requirements °You must have lived in [place], [place], or [place] counties for at least the last three months. °  You cannot be eligible for state or federal sponsored healthcare insurance, including Veterans Administration, Medicaid, or Medicare. °  You generally cannot be eligible for healthcare insurance through your employer.  °  How to apply: °Eligibility screenings are held every Tuesday and Wednesday afternoon from 1:00 pm until 4:00 pm. You do not need an appointment for the interview!  °[facility] [address], [place], [place] 336-631-2330   °[facility]  336-342-8273   °[place] [facility]  336-703-3100   °[facility]  336-570-6415   ° °Behavioral Health Resources in the Community: °Intensive Outpatient Programs °Organization         Address  Phone  Notes  °[facility] [address], [place], [place] 336-878-6098   °[facility] [address], [place], [place] 336-832-9800   °[facility] [address], [place], [place] ° 336-882-2125   °[facility] [address],  °[place], [place] 1-800-853-5163 or 336-641-4981   °Substance Abuse Resources °Organization         Address  Phone  Notes  °[facility]  336-882-2125   °[facility]  336-784-9470   °[facility]  336-285-9073   °[facility]  336-845-3988   °Residential & Outpatient Substance Abuse Program  1-800-659-3381   °Psychological Services °Organization         Address  Phone  Notes  °[facility]  336- 832-9600   °[facility]  336- 378-7881   °[facility] [address], [place] 1-800-853-5163 or 336-641-4981   ° °Mobile Crisis Teams °Organization         Address  Phone  Notes  °[facility], Mobile Crisis Care Unit  1-877-626-1772   °Assertive °[facility] ° [address]  [place], [place] 336-834-9664   °Sharon DeEsch [address] °[place] 336-554-5454   ° °Self-Help/Support Groups °Organization         Address  Phone             Notes  °[facility] [place] - variety of support groups  336- 373-1402 Call for more information  °Narcotics Anonymous (NA), [facility] [address], °[place]   2 meetings at this location  ° °  Residential Treatment Programs Organization         Address  Phone  Notes  [facility] [address]    [place]  1-305-387-8538   [facility]  [address] [place] [address], [place], [place]   [place], [place] 605-016-3355 Admissions: 8am-3pm M-F  [facility] [place] [address],    [place], [place] 614-709-2957   [facility] [address] [place], [place], [place]   [facility] [address],  [place], [place]   [facility] Intensive Outpatient [address], [place], [place]   [facility] ([employer] [place]  [place], [place] 1-813-348-3640 or [phone]   [facility] [address] [place], [place] Accepts Medicaid  [facility] [address],  [place] 1-4317780545 Substance Abuse/Addiction Treatment   [facility]         Address  Phone  Notes  [facility]  878-430-3586   Domenic Schwab, PhD [address] [place], [place]   713-037-1820 or 559-252-4481   [place], [place] 912-263-5077   [facility] [address] [place], [place] [phone] Insurance/Medicaid/sponsorship through [facility] [address] [place]                                    [place], [place] [phone])[phone] [place] [address], [place] ([phone]) [phone]    Dr. Adele Schilder  [phone]   [facility] Dept. 1) [address] [place] 2) [place] 3)  [place] [address], [place] [phone]  [phone]   [place] [phone] or [phone] (After Hours)      Take the prescription as directed.  Call your regular GI doctor tomorrow to schedule a follow up appointment within the next 2 days.  Return to the Emergency Department immediately sooner if worsening.

## 2015-09-26 NOTE — ED Notes (Signed)
Pt taken to Radiology.

## 2015-09-27 ENCOUNTER — Telehealth (INDEPENDENT_AMBULATORY_CARE_PROVIDER_SITE_OTHER): Payer: Self-pay | Admitting: *Deleted

## 2015-09-27 ENCOUNTER — Ambulatory Visit (HOSPITAL_COMMUNITY): Payer: Medicare Other

## 2015-09-27 NOTE — Telephone Encounter (Signed)
noted 

## 2015-09-27 NOTE — Telephone Encounter (Signed)
Noted  

## 2015-09-27 NOTE — Telephone Encounter (Signed)
Per Dr Laural Golden, we are not to give pain medicine to patient, he needs to get from PCP, patient is aware

## 2015-10-01 LAB — CULTURE, BODY FLUID W GRAM STAIN -BOTTLE: Culture: NO GROWTH

## 2015-10-01 LAB — CULTURE, BODY FLUID-BOTTLE

## 2015-10-02 ENCOUNTER — Encounter (HOSPITAL_COMMUNITY): Payer: Self-pay

## 2015-10-02 ENCOUNTER — Ambulatory Visit (HOSPITAL_COMMUNITY)
Admission: RE | Admit: 2015-10-02 | Discharge: 2015-10-02 | Disposition: A | Discharge status: Home / Self Care | Payer: Medicare Other | Source: Ambulatory Visit | Attending: Internal Medicine | Admitting: Internal Medicine

## 2015-10-02 ENCOUNTER — Telehealth (INDEPENDENT_AMBULATORY_CARE_PROVIDER_SITE_OTHER): Payer: Self-pay | Admitting: *Deleted

## 2015-10-02 ENCOUNTER — Telehealth (INDEPENDENT_AMBULATORY_CARE_PROVIDER_SITE_OTHER): Payer: Self-pay | Admitting: Internal Medicine

## 2015-10-02 DIAGNOSIS — K7460 Unspecified cirrhosis of liver: Secondary | ICD-10-CM

## 2015-10-02 DIAGNOSIS — R188 Other ascites: Secondary | ICD-10-CM

## 2015-10-02 DIAGNOSIS — Z8507 Personal history of malignant neoplasm of pancreas: Secondary | ICD-10-CM | POA: Diagnosis not present

## 2015-10-02 DIAGNOSIS — Z85118 Personal history of other malignant neoplasm of bronchus and lung: Secondary | ICD-10-CM | POA: Diagnosis not present

## 2015-10-02 LAB — GRAM STAIN

## 2015-10-02 LAB — BODY FLUID CELL COUNT WITH DIFFERENTIAL
EOS FL: 0 %
LYMPHS FL: 24 %
Monocyte-Macrophage-Serous Fluid: 20 % — ABNORMAL LOW (ref 50–90)
NEUTROPHIL FLUID: 56 % — AB (ref 0–25)
Total Nucleated Cell Count, Fluid: 79 cu mm (ref 0–1000)

## 2015-10-02 MED ORDER — ALBUMIN HUMAN 25 % IV SOLN
INTRAVENOUS | Status: AC
  Administered 2015-10-02: 50 g via INTRAVENOUS
  Filled 2015-10-02: qty 200

## 2015-10-02 MED ORDER — ALBUMIN HUMAN 25 % IV SOLN
50.0000 g | Freq: Once | INTRAVENOUS | Status: AC
  Administered 2015-10-02: 50 g via INTRAVENOUS

## 2015-10-02 NOTE — Procedures (Signed)
PreOperative Dx: Cirrhosis, ascites Postoperative Dx: Cirrhosis, ascites Procedure:   US guided paracentesis Radiologist:  Thornton Papas Anesthesia:  10 ml of 1% lidocaine Specimen:  5600 ml of light yellow colored ascitic fluid EBL:   < 1 ml Complications: None

## 2015-10-02 NOTE — Telephone Encounter (Signed)
Needs abdominal Tap please   [phone]

## 2015-10-02 NOTE — Telephone Encounter (Signed)
LVAP sch'd for 10/02/15 at 100, patient aware

## 2015-10-02 NOTE — Telephone Encounter (Signed)
US paracentesis ordered 

## 2015-10-03 ENCOUNTER — Ambulatory Visit (INDEPENDENT_AMBULATORY_CARE_PROVIDER_SITE_OTHER): Payer: Medicare Other | Admitting: Internal Medicine

## 2015-10-03 ENCOUNTER — Encounter (INDEPENDENT_AMBULATORY_CARE_PROVIDER_SITE_OTHER): Payer: Self-pay | Admitting: Internal Medicine

## 2015-10-03 VITALS — BP 84/66 | HR 88 | Temp 97.6°F | Resp 18 | Ht 72.0 in | Wt 113.0 lb

## 2015-10-03 DIAGNOSIS — R1011 Right upper quadrant pain: Secondary | ICD-10-CM

## 2015-10-03 DIAGNOSIS — K7460 Unspecified cirrhosis of liver: Secondary | ICD-10-CM | POA: Diagnosis not present

## 2015-10-03 DIAGNOSIS — E43 Unspecified severe protein-calorie malnutrition: Secondary | ICD-10-CM

## 2015-10-03 DIAGNOSIS — R634 Abnormal weight loss: Secondary | ICD-10-CM

## 2015-10-03 DIAGNOSIS — R188 Other ascites: Principal | ICD-10-CM

## 2015-10-03 LAB — THYROID PANEL WITH TSH
FREE THYROXINE INDEX: 1.9 (ref 1.4–3.8)
T3 Uptake: 36 % — ABNORMAL HIGH (ref 22–35)
T4 TOTAL: 5.4 ug/dL (ref 4.5–12.0)
TSH: 2.622 u[IU]/mL (ref 0.350–4.500)

## 2015-10-03 LAB — IRON AND TIBC
%SAT: 80 % — ABNORMAL HIGH (ref 15–60)
Iron: 67 ug/dL (ref 50–180)
TIBC: 84 ug/dL — AB (ref 250–425)
UIBC: 17 ug/dL — AB (ref 125–400)

## 2015-10-03 LAB — PATHOLOGIST SMEAR REVIEW

## 2015-10-03 LAB — FERRITIN: Ferritin: 309 ng/mL (ref 22–322)

## 2015-10-03 MED ORDER — OXYCODONE HCL 5 MG PO TABS: 5.0000 mg | ORAL_TABLET | Freq: Two times a day (BID) | ORAL | Status: DC | PRN

## 2015-10-03 NOTE — Patient Instructions (Signed)
Physician will call with results of blood test. Please check with your primary care physician about getting hepatitis A and B vaccination. Consultation at [facility] to be arranged.

## 2015-10-03 NOTE — Progress Notes (Signed)
Presenting complaint;  Follow-up for chronic liver disease.  Subjective:  Patient is 54 year old Caucasian male who is here for scheduled visit. He was last seen 3 weeks ago when he weighed 130 pounds. Today he was 113 pounds. He is accompanied by his brother Mr. Clelia Schaumann. Patient does not feel well. He feels weak and tired. He complains of abdominal pain primarily in right upper quadrant of abdomen. He states Dr. Whitney Muse has stopped riding his pain medications. He states he got prescription for a few pain pills from PCP and she should not have done. He had abdominal tap yesterday. He states he has good appetite until abdomen becomes tight. He denies nausea vomiting fever or chills. He is having 1-2 bowel movements per day. He denies melena or rectal bleeding. Has noted intermittent scrotal swelling. His brother states his eyes have always been prominent and when he was a boy he was called big eye buddy. He quit cigarette smoking 5 years ago. He has not had any alcohol since May 2016. Prior to that he would have a couple of beers a weekend but not every day. He does not believe that his cirrhosis is secondary to excessive alcohol intake.   Current Medications: Outpatient Encounter Prescriptions as of 10/03/2015  Medication Sig  . apixaban (ELIQUIS) 2.5 MG TABS tablet Take 1 tablet (2.5 mg total) by mouth 2 (two) times daily.  . furosemide (LASIX) 40 MG tablet Take 1 tablet (40 mg total) by mouth daily.  . ondansetron (ZOFRAN) 4 MG tablet Take 1 tablet (4 mg total) by mouth 4 (four) times daily -  with meals and at bedtime.  Marland Kitchen spironolactone (ALDACTONE) 50 MG tablet Take 2 tablets (100 mg total) by mouth daily. (Patient taking differently: Take 50 mg by mouth daily. )  . ALPRAZolam (XANAX) 0.25 MG tablet Take 0.25 mg by mouth 3 (three) times daily as needed for anxiety or sleep.   . feeding supplement, ENSURE ENLIVE, (ENSURE ENLIVE) LIQD Take 237 mLs by mouth 2 (two) times daily between  meals. (Patient not taking: Reported on 10/03/2015)  . OVER THE COUNTER MEDICATION Apply 1 application topically as needed (for hemorrhoids). OTC Hemorrhoid cream  . Oxycodone HCl 10 MG TABS Take 1 tablet (10 mg total) by mouth every 6 (six) hours as needed for moderate pain or severe pain. (Patient not taking: Reported on 10/03/2015)   No facility-administered encounter medications on file as of 10/03/2015.     Objective: Blood pressure 84/66, pulse 88, temperature 97.6 F (36.4 C), temperature source Oral, resp. rate 18, height 6' (1.829 m), weight 113 lb (51.256 kg). Patient is alert and in no acute distress. He appears ectatic and chronically ill. Conjunctiva is pink. Sclera is nonicteric. Sclera is visible above cornea in both eyes. Oropharyngeal mucosa is normal. No neck masses or thyromegaly noted. Cardiac exam with regular rhythm normal S1 and S2. No murmur or gallop noted. Lungs are clear to auscultation. Abdomen is flat and soft. He has mild tenderness in right upper quadrant and epigastric region both in superficial and deep palpation. Left lobe of the liver appears nodular. Spleen is not palpable. He has 1+ pitting edema around his ankles and distal legs.  Labs/studies Results: Serum prealbumin was 6 mg/dl on 09/20/2015. MR liver performed on 09/20/2015 pertinent for ascites mesenteric edema small right pleural effusion and stable nodularity to the lingula and right lower lobe. Diffuse wall thickening to the colon but no focal abnormality to liver.    Assessment:  #1.  Advanced cirrhosis possibly multifactorial. Liver biopsy over two months ago revealed mixed micro-and macrovesicular steatosis. Patient denies excessive alcohol use in the past. Liver biopsy did not suggest advanced liver disease but he is behaving like he has end-stage liver disease. He unfortunately is not a candidate for liver transplant because of recent history of adenocarcinoma of the lung just diagnosed  about 3 years ago. #2. Refractory ascites requiring abdominal tap every week. Fluid cytology is negative for mesothelioma or malignancy. #3. Chronic right-sided abdominal pain. He is not getting pain medication from Dr. Whitney Muse anymore. #4. Cachexia and malnutrition. Eye exam suggestive of thyroid disease. He has lost 17 pounds in the last 3 weeks. Will rule out thyroid disease. It is possible that his muscle wasting is secondary to chronic liver disease.   Plan:  Oxycodone 5 mg by mouth twice a day when necessary. Prescription given for 60 without refill. Patient offered referral to pain clinic but he is not interested at the present time. Patient will go to the lab for serum cortisol level, serum ferritin thyroid panel with TSH and metabolic 7. Referral to Dr. Markus Daft for consideration of TIPS. Office visit in one month.

## 2015-10-04 LAB — CORTISOL: Cortisol, Plasma: 16.5 ug/dL

## 2015-10-05 ENCOUNTER — Telehealth (INDEPENDENT_AMBULATORY_CARE_PROVIDER_SITE_OTHER): Payer: Self-pay | Admitting: *Deleted

## 2015-10-05 NOTE — Telephone Encounter (Signed)
Patients wants to know if we can refer him to a pain management clinic.  I told him I would send message back and if that is something we can help with we can help facilitate this.

## 2015-10-05 NOTE — Telephone Encounter (Signed)
Per Dr.Rehman patient may be referred to pain management. Forwarded to Lelon Frohlich to arrange , then patient will be called.

## 2015-10-07 LAB — CULTURE, BODY FLUID W GRAM STAIN -BOTTLE

## 2015-10-07 LAB — CULTURE, BODY FLUID-BOTTLE: CULTURE: NO GROWTH

## 2015-10-09 NOTE — Telephone Encounter (Signed)
Referral faxed to [facility], they will contact patient with appt

## 2015-10-10 ENCOUNTER — Telehealth (INDEPENDENT_AMBULATORY_CARE_PROVIDER_SITE_OTHER): Payer: Self-pay | Admitting: *Deleted

## 2015-10-10 ENCOUNTER — Encounter (HOSPITAL_COMMUNITY): Payer: Self-pay | Admitting: Oncology

## 2015-10-10 DIAGNOSIS — D539 Nutritional anemia, unspecified: Secondary | ICD-10-CM

## 2015-10-10 HISTORY — DX: Nutritional anemia, unspecified: D53.9

## 2015-10-10 NOTE — Telephone Encounter (Signed)
Patrick Lee from [facility] called   Patient canceled his TIPS evaluation   States he is doing great --states he has a stent and he is great  She was not sure what he meant by he has a stent but he said he didn't need this appt.  She wanted [place] to be aware of this.

## 2015-10-10 NOTE — Progress Notes (Signed)
No show

## 2015-10-10 NOTE — Telephone Encounter (Signed)
Dr.Rehman was made aware. Patient has an appointment with Dr.Rehman ,patient is in recall per Dr.Rehman.

## 2015-10-11 ENCOUNTER — Ambulatory Visit (HOSPITAL_COMMUNITY): Payer: Medicare Other | Admitting: Oncology

## 2015-10-12 ENCOUNTER — Other Ambulatory Visit: Payer: Medicare Other

## 2015-10-16 ENCOUNTER — Other Ambulatory Visit (INDEPENDENT_AMBULATORY_CARE_PROVIDER_SITE_OTHER): Payer: Self-pay | Admitting: Internal Medicine

## 2015-10-16 ENCOUNTER — Telehealth (INDEPENDENT_AMBULATORY_CARE_PROVIDER_SITE_OTHER): Payer: Self-pay | Admitting: *Deleted

## 2015-10-16 ENCOUNTER — Ambulatory Visit (HOSPITAL_COMMUNITY)
Admission: RE | Admit: 2015-10-16 | Discharge: 2015-10-16 | Disposition: A | Discharge status: Home / Self Care | Payer: Medicare Other | Source: Ambulatory Visit | Attending: Internal Medicine | Admitting: Internal Medicine

## 2015-10-16 ENCOUNTER — Encounter (HOSPITAL_COMMUNITY): Payer: Self-pay

## 2015-10-16 ENCOUNTER — Encounter (INDEPENDENT_AMBULATORY_CARE_PROVIDER_SITE_OTHER): Payer: Self-pay | Admitting: *Deleted

## 2015-10-16 DIAGNOSIS — R188 Other ascites: Secondary | ICD-10-CM

## 2015-10-16 DIAGNOSIS — K7460 Unspecified cirrhosis of liver: Secondary | ICD-10-CM | POA: Diagnosis not present

## 2015-10-16 LAB — GRAM STAIN

## 2015-10-16 LAB — BODY FLUID CELL COUNT WITH DIFFERENTIAL
Eos, Fluid: 0 %
Lymphs, Fluid: 36 %
Monocyte-Macrophage-Serous Fluid: 15 % — ABNORMAL LOW (ref 50–90)
NEUTROPHIL FLUID: 49 % — AB (ref 0–25)
WBC FLUID: 101 uL (ref 0–1000)

## 2015-10-16 MED ORDER — ALBUMIN HUMAN 25 % IV SOLN
INTRAVENOUS | Status: AC
  Administered 2015-10-16: 50 g
  Filled 2015-10-16: qty 200

## 2015-10-16 NOTE — Procedures (Signed)
PreOperative Dx: Ascites Postoperative Dx: Ascites Procedure:   US guided paracentesis Radiologist:  Thornton Papas Anesthesia:  10 ml of 1% lidocaine Specimen:  4180 ml of clear yellow ascitic fluid EBL:   < 1 ml Complications: None

## 2015-10-16 NOTE — Telephone Encounter (Signed)
Patient left message wanting to have abd tap -- if ok, please put order in, thanks

## 2015-10-16 NOTE — Telephone Encounter (Signed)
noted 

## 2015-10-16 NOTE — Progress Notes (Signed)
Paracentesis compete no signs of distress. 4180 ml yellow colored ascites removed.

## 2015-10-17 LAB — PATHOLOGIST SMEAR REVIEW

## 2015-10-21 LAB — CULTURE, BODY FLUID-BOTTLE: CULTURE: NO GROWTH

## 2015-10-21 LAB — CULTURE, BODY FLUID W GRAM STAIN -BOTTLE

## 2015-10-26 ENCOUNTER — Ambulatory Visit (HOSPITAL_COMMUNITY)
Admission: RE | Admit: 2015-10-26 | Discharge: 2015-10-26 | Disposition: A | Discharge status: Home / Self Care | Payer: Medicare Other | Source: Ambulatory Visit | Attending: Internal Medicine | Admitting: Internal Medicine

## 2015-10-26 ENCOUNTER — Telehealth (INDEPENDENT_AMBULATORY_CARE_PROVIDER_SITE_OTHER): Payer: Self-pay | Admitting: Internal Medicine

## 2015-10-26 ENCOUNTER — Telehealth (INDEPENDENT_AMBULATORY_CARE_PROVIDER_SITE_OTHER): Payer: Self-pay | Admitting: *Deleted

## 2015-10-26 DIAGNOSIS — R188 Other ascites: Secondary | ICD-10-CM | POA: Insufficient documentation

## 2015-10-26 DIAGNOSIS — K7460 Unspecified cirrhosis of liver: Secondary | ICD-10-CM | POA: Diagnosis not present

## 2015-10-26 LAB — GRAM STAIN

## 2015-10-26 LAB — BODY FLUID CELL COUNT WITH DIFFERENTIAL
EOS FL: 2 %
Lymphs, Fluid: 27 %
Monocyte-Macrophage-Serous Fluid: 16 % — ABNORMAL LOW (ref 50–90)
NEUTROPHIL FLUID: 55 % — AB (ref 0–25)
WBC FLUID: 59 uL (ref 0–1000)

## 2015-10-26 MED ORDER — ALBUMIN HUMAN 25 % IV SOLN
INTRAVENOUS | Status: AC
  Administered 2015-10-26: 50 g via INTRAVENOUS
  Filled 2015-10-26: qty 200

## 2015-10-26 MED ORDER — ALBUMIN HUMAN 25 % IV SOLN
50.0000 g | Freq: Once | INTRAVENOUS | Status: AC
  Administered 2015-10-26: 50 g via INTRAVENOUS

## 2015-10-26 NOTE — Telephone Encounter (Signed)
Patient was scheduled for 2:00 pm today and Lelon Frohlich called patient

## 2015-10-26 NOTE — Progress Notes (Signed)
Paracentesis complete no signs of distress. 5500 ml yellow colored ascites removed.

## 2015-10-26 NOTE — Telephone Encounter (Signed)
Miserable and would like tap

## 2015-10-26 NOTE — Procedures (Signed)
PreOperative Dx: Cirrhosis, ascites Postoperative Dx: Cirrhosis, ascites Procedure:   US guided paracentesis Radiologist:  Thornton Papas Anesthesia:  10 ml of 1% lidocaine Specimen:  3500 ml of light yellow ascitic fluid EBL:   < 1 ml Complications: None

## 2015-10-26 NOTE — Telephone Encounter (Signed)
US paracentesis ordered 

## 2015-10-27 LAB — PATHOLOGIST SMEAR REVIEW

## 2015-10-31 ENCOUNTER — Ambulatory Visit (INDEPENDENT_AMBULATORY_CARE_PROVIDER_SITE_OTHER): Payer: Medicare Other | Admitting: Internal Medicine

## 2015-10-31 ENCOUNTER — Encounter (INDEPENDENT_AMBULATORY_CARE_PROVIDER_SITE_OTHER): Payer: Self-pay | Admitting: Internal Medicine

## 2015-10-31 VITALS — BP 94/52 | HR 72 | Temp 97.6°F | Ht 72.0 in | Wt 131.0 lb

## 2015-10-31 DIAGNOSIS — K7031 Alcoholic cirrhosis of liver with ascites: Secondary | ICD-10-CM

## 2015-10-31 DIAGNOSIS — R1011 Right upper quadrant pain: Secondary | ICD-10-CM | POA: Diagnosis not present

## 2015-10-31 LAB — CBC WITH DIFFERENTIAL/PLATELET
Basophils Absolute: 0.1 10*3/uL (ref 0.0–0.1)
Basophils Relative: 1 % (ref 0–1)
Eosinophils Absolute: 0.3 10*3/uL (ref 0.0–0.7)
Eosinophils Relative: 3 % (ref 0–5)
HCT: 28.9 % — ABNORMAL LOW (ref 39.0–52.0)
Hemoglobin: 9.7 g/dL — ABNORMAL LOW (ref 13.0–17.0)
Lymphocytes Relative: 21 % (ref 12–46)
Lymphs Abs: 1.9 10*3/uL (ref 0.7–4.0)
MCH: 34.9 pg — ABNORMAL HIGH (ref 26.0–34.0)
MCHC: 33.6 g/dL (ref 30.0–36.0)
MCV: 104 fL — ABNORMAL HIGH (ref 78.0–100.0)
MPV: 9.4 fL (ref 8.6–12.4)
Monocytes Absolute: 0.6 10*3/uL (ref 0.1–1.0)
Monocytes Relative: 7 % (ref 3–12)
Neutro Abs: 6.1 10*3/uL (ref 1.7–7.7)
Neutrophils Relative %: 68 % (ref 43–77)
Platelets: 247 10*3/uL (ref 150–400)
RBC: 2.78 MIL/uL — ABNORMAL LOW (ref 4.22–5.81)
RDW: 14 % (ref 11.5–15.5)
WBC: 8.9 10*3/uL (ref 4.0–10.5)

## 2015-10-31 LAB — COMPREHENSIVE METABOLIC PANEL
ALBUMIN: 2.5 g/dL — AB (ref 3.6–5.1)
ALT: 13 U/L (ref 9–46)
AST: 22 U/L (ref 10–35)
Alkaline Phosphatase: 58 U/L (ref 40–115)
BUN: 13 mg/dL (ref 7–25)
CALCIUM: 8 mg/dL — AB (ref 8.6–10.3)
CHLORIDE: 108 mmol/L (ref 98–110)
CO2: 23 mmol/L (ref 20–31)
Creat: 0.8 mg/dL (ref 0.70–1.33)
Glucose, Bld: 79 mg/dL (ref 65–99)
POTASSIUM: 4.4 mmol/L (ref 3.5–5.3)
Sodium: 136 mmol/L (ref 135–146)
TOTAL PROTEIN: 4.6 g/dL — AB (ref 6.1–8.1)
Total Bilirubin: 0.3 mg/dL (ref 0.2–1.2)

## 2015-10-31 LAB — CULTURE, BODY FLUID W GRAM STAIN -BOTTLE

## 2015-10-31 LAB — CULTURE, BODY FLUID-BOTTLE: CULTURE: NO GROWTH

## 2015-10-31 MED ORDER — OXYCODONE HCL 5 MG PO TABS: 5.0000 mg | ORAL_TABLET | Freq: Two times a day (BID) | ORAL | Status: DC | PRN

## 2015-10-31 NOTE — Patient Instructions (Signed)
OV in 6 weeks.

## 2015-10-31 NOTE — Progress Notes (Addendum)
Subjective:    Patient ID: Patrick Lee, male    DOB: 1961/07/10, 54 y.o.   MRN: [id number]  HPI Here today for f/u of his cirrhosis. He has undergone multiple paracenteses. His last paracentesis was 10/26/2015 with the removal of 3500 cc fluid.  No etoh since May 2016. He tells me he feels good.  He has a good appetitie. He has gained from 113 to 131. He denies abdominal distention.  No nausea or vomiting,chills, or fever. He having 1-2 bowel movements per day. He denies any melena or BRRB.  He says he is has been passing stones around the first of December.  He says he has some abdominal pain which he rates at a 6.  He is thankful that he is able to eat.     US paracentesis 10/26/2015 A total of approximately 3500 mL of ascitic fluid was removed. A fluid sample of 180 mL was sent for laboratory analysis.  IMPRESSION: Successful ultrasound guided paracentesis yielding 3500 mL of fluid. Fluid negative for SBP.     Review of Systems Past Medical History  Diagnosis Date  . Lateral epicondylitis   . Chest pain, unspecified   . Abdominal pain, other specified site   . Obstruction of bile duct   . Tobacco use disorder   . Anxiety   . Shortness of breath     uses oxygen at night- 2 liters   . Myocardial infarction [facility]) 2007    during [place]- 2007  . Complication of anesthesia     pt. reports that he had MI while under anesth. for Whipple procedure  . Pancreatic cancer ([place])      status post Whipple procedure.  . Lung cancer ([place])   . Macrocytic anemia 10/10/2015    Past Surgical History  Procedure Laterality Date  . Whipple procedure  2007  . Percutaneous extraction of gallstones      [facility]  . Knee arthroscopy      R knee- at [facility]  . Cholecystectomy      open  . Video bronchoscopy  04/08/2012    Procedure: VIDEO BRONCHOSCOPY;  Surgeon: Gaye Pollack, MD;  Location: [facility] OR;  Service: Thoracic;  Laterality: N/A;  . Partial right  lung removal    . Colonoscopy  May 2014    Dr. Anthony Sar: normal   . Esophagogastroduodenoscopy  Dec 2015    [facility]. normal esophagus, patent afferent and efferent libs, no abnormalities in jejunum.   . Bone marrow biopsy Left 05/25/15  . Bone marrow aspiration Left 05/25/15    No Known Allergies  Current Outpatient Prescriptions on File Prior to Visit  Medication Sig Dispense Refill  . apixaban (ELIQUIS) 2.5 MG TABS tablet Take 1 tablet (2.5 mg total) by mouth 2 (two) times daily. 60 tablet 3  . furosemide (LASIX) 40 MG tablet Take 1 tablet (40 mg total) by mouth daily. 30 tablet 3  . ondansetron (ZOFRAN) 4 MG tablet Take 1 tablet (4 mg total) by mouth 4 (four) times daily -  with meals and at bedtime. 120 tablet 3  . OVER THE COUNTER MEDICATION Apply 1 application topically as needed (for hemorrhoids). OTC Hemorrhoid cream    . spironolactone (ALDACTONE) 50 MG tablet Take 2 tablets (100 mg total) by mouth daily. (Patient taking differently: Take 50 mg by mouth daily. 35m daily) 100 tablet 3  . feeding supplement, ENSURE ENLIVE, (ENSURE ENLIVE) LIQD Take 237 mLs by mouth 2 (two)  times daily between meals. (Patient not taking: Reported on 10/31/2015) 237 mL 12  . oxyCODONE (OXY IR/ROXICODONE) 5 MG immediate release tablet Take 1 tablet (5 mg total) by mouth 2 (two) times daily as needed for severe pain. (Patient not taking: Reported on 10/31/2015) 60 tablet 0   No current facility-administered medications on file prior to visit.        Objective:   Physical Exam Blood pressure 94/52, pulse 72, temperature 97.6 F (36.4 C), height 6' (1.829 m), weight 131 lb (59.421 kg).  Alert and oriented. Skin warm and dry. Oral mucosa is moist.   . Sclera anicteric, conjunctivae is pink. Thyroid not enlarged. No cervical lymphadenopathy. Lungs clear. Heart regular rate and rhythm.  Abdomen is soft. Bowel sounds are positive. No hepatomegaly. No abdominal masses felt. No tenderness.  No edema to lower  extremities.         Assessment & Plan:  Cirrhosis with Ascites. He says he feels much better. He has gained about 18 pounds since November 30. I do not see any excess edema. OV in 6 weeks. CBC and CMET. Will set up a referral to Dr. Steve Rattler for consideration of TIPS (H cancelled 1st appt) Refill on Oxycodone.

## 2015-11-09 ENCOUNTER — Telehealth (INDEPENDENT_AMBULATORY_CARE_PROVIDER_SITE_OTHER): Payer: Self-pay | Admitting: Internal Medicine

## 2015-11-09 DIAGNOSIS — R188 Other ascites: Secondary | ICD-10-CM

## 2015-11-09 NOTE — Telephone Encounter (Signed)
[place] prodered

## 2015-11-09 NOTE — Telephone Encounter (Signed)
Patrick Lee called saying he needs "fluid pumped off of his stomach" and is wondering if we can get that scheduled for him. Please give him a call.  Pt's ph# [phone] Thank you.

## 2015-11-10 ENCOUNTER — Encounter (HOSPITAL_COMMUNITY): Payer: Self-pay

## 2015-11-10 ENCOUNTER — Ambulatory Visit (HOSPITAL_COMMUNITY)
Admission: RE | Admit: 2015-11-10 | Discharge: 2015-11-10 | Disposition: A | Discharge status: Home / Self Care | Payer: Medicare Other | Source: Ambulatory Visit | Attending: Internal Medicine | Admitting: Internal Medicine

## 2015-11-10 DIAGNOSIS — R188 Other ascites: Secondary | ICD-10-CM | POA: Diagnosis not present

## 2015-11-10 LAB — BODY FLUID CELL COUNT WITH DIFFERENTIAL
Eos, Fluid: 0 %
LYMPHS FL: 7 %
MONOCYTE-MACROPHAGE-SEROUS FLUID: 22 % — AB (ref 50–90)
Neutrophil Count, Fluid: 71 % — ABNORMAL HIGH (ref 0–25)
WBC FLUID: 115 uL (ref 0–1000)

## 2015-11-10 LAB — GRAM STAIN

## 2015-11-10 MED ORDER — ALBUMIN HUMAN 25 % IV SOLN
50.0000 g | Freq: Once | INTRAVENOUS | Status: AC
  Administered 2015-11-10: 50 g via INTRAVENOUS

## 2015-11-10 MED ORDER — ALBUMIN HUMAN 25 % IV SOLN
INTRAVENOUS | Status: AC
  Administered 2015-11-10: 50 g via INTRAVENOUS
  Filled 2015-11-10: qty 200

## 2015-11-10 NOTE — Progress Notes (Signed)
Paracentesis complete no signs of distress. 2800 ml yellow colored ascites removed.

## 2015-11-13 LAB — PATHOLOGIST SMEAR REVIEW

## 2015-11-16 ENCOUNTER — Ambulatory Visit
Admission: RE | Admit: 2015-11-16 | Discharge: 2015-11-16 | Disposition: A | Discharge status: Home / Self Care | Payer: Medicare Other | Source: Ambulatory Visit | Attending: Internal Medicine | Admitting: Internal Medicine

## 2015-11-16 DIAGNOSIS — R1011 Right upper quadrant pain: Secondary | ICD-10-CM

## 2015-11-16 DIAGNOSIS — R188 Other ascites: Principal | ICD-10-CM

## 2015-11-16 DIAGNOSIS — K7460 Unspecified cirrhosis of liver: Secondary | ICD-10-CM

## 2015-11-16 DIAGNOSIS — R634 Abnormal weight loss: Secondary | ICD-10-CM

## 2015-11-16 DIAGNOSIS — E43 Unspecified severe protein-calorie malnutrition: Secondary | ICD-10-CM

## 2015-11-16 LAB — PATHOLOGIST SMEAR REVIEW

## 2015-11-16 NOTE — Consult Note (Signed)
Chief Complaint: Patient was seen in consultation today for TIPS evaluation  Referring Physician(s): Rehman,Najeeb U  History of Present Illness:  Patrick Lee is a 55 y.o. male with past medical history significant for pancreatic cancer (post Whipple), lung cancer (both of which are currently in remission), smoking, CAD (post myocardial infarction) and cirrhosis who presents to the interventional radiology clinic for evaluation of potential percutaneous treatment options for recurrent symptomatic ascites. The patient is unaccompanied and serves as his own historian.  The patient has underwent a near weekly paracenteses since June of this past year. Patient underwent a fluoroscopic guided transjugular liver biopsy on 07/24/2015 with pathology compatible with mixed micro-and macro vascular steatosis but without evidence of advanced liver disease. Of note, there is a questionable history of the amount of his previous alcohol use.  Most recent PET scan performed 04/23/2015 was negative for evidence of residual or recurrent disease and without definitive evidence of peritoneal carcinomatosis.  Abdominal MRI performed 09/20/2015 was without evidence of primary or metastatic hepatic malignancy.  The patient admits to a transient episode of altered mental status lasting approximately 2-3 days in mid September. This transient episode resolved without dedicated intervention.  The patient otherwise denies change in mental status.  Patient states that he experienced a biliary colic episode in November of last year and since this time he has noted a subjective improvement in the amount of abdominal ascites, with most recent paracentesis performed 11/09/2014 yielding only approximately 1.8 L of ascites (previous paracenteses have yielded greater than 3.5 L).  The patient also reports significant improvement in his appetite recently with an approximately 13 pound weight gain. Patient states his energy level  is improved. Patient denies diarrhea, bloody or melanotic stools. No nausea or vomiting. No hemochezia. He has never experienced an upper GI bleed. No yellowing of the skin or eyes.   Past Medical History  Diagnosis Date  . Lateral epicondylitis   . Chest pain, unspecified   . Abdominal pain, other specified site   . Obstruction of bile duct   . Tobacco use disorder   . Anxiety   . Shortness of breath     uses oxygen at night- 2 liters   . Myocardial infarction [facility]) 2007    during [place]- 2007  . Complication of anesthesia     pt. reports that he had MI while under anesth. for Whipple procedure  . Pancreatic cancer ([place])      status post Whipple procedure.  . Lung cancer ([place])   . Macrocytic anemia 10/10/2015    Past Surgical History  Procedure Laterality Date  . Whipple procedure  2007  . Percutaneous extraction of gallstones      [facility]  . Knee arthroscopy      R knee- at [facility]  . Cholecystectomy      open  . Video bronchoscopy  04/08/2012    Procedure: VIDEO BRONCHOSCOPY;  Surgeon: Gaye Pollack, MD;  Location: [facility] OR;  Service: Thoracic;  Laterality: N/A;  . Partial right lung removal    . Colonoscopy  May 2014    Dr. Anthony Sar: normal   . Esophagogastroduodenoscopy  Dec 2015    [facility]. normal esophagus, patent afferent and efferent libs, no abnormalities in jejunum.   . Bone marrow biopsy Left 05/25/15  . Bone marrow aspiration Left 05/25/15    Allergies: Review of patient's allergies indicates no known allergies.  Medications: Prior to Admission  medications   Medication Sig Start Date End Date Taking? Authorizing Provider  apixaban (ELIQUIS) 2.5 MG TABS tablet Take 1 tablet (2.5 mg total) by mouth 2 (two) times daily. 09/11/15  Yes Patrici Ranks, MD  feeding supplement, ENSURE ENLIVE, (ENSURE ENLIVE) LIQD Take 237 mLs by mouth 2 (two) times daily between meals. 04/23/15  Yes Maryann Mikhail, DO  furosemide (LASIX) 40 MG  tablet Take 1 tablet (40 mg total) by mouth daily. 08/10/15  Yes Butch Penny, NP  ondansetron (ZOFRAN) 4 MG tablet Take 1 tablet (4 mg total) by mouth 4 (four) times daily -  with meals and at bedtime. 06/20/15  Yes Orvil Feil, NP  spironolactone (ALDACTONE) 50 MG tablet Take 2 tablets (100 mg total) by mouth daily. Patient taking differently: Take 50 mg by mouth daily. 9m daily 08/10/15  Yes TButch Penny NP  OVER THE COUNTER MEDICATION Apply 1 application topically as needed (for hemorrhoids). OTC Hemorrhoid cream    Historical Provider, MD  oxyCODONE (OXY IR/ROXICODONE) 5 MG immediate release tablet Take 1 tablet (5 mg total) by mouth 2 (two) times daily as needed for severe pain. Patient not taking: Reported on 11/16/2015 10/31/15   TButch Penny NP     Family History  Problem Relation Age of Onset  . Stroke Father 685   Deceased  . Anesthesia problems Neg Hx   . Colon cancer Neg Hx   . Cirrhosis Neg Hx     Social History   Social History  . Marital Status: Divorced    Spouse Name: N/A  . Number of Children: 4  . Years of Education: N/A   Occupational History  . UNEMPLOYED    Social History Main Topics  . Smoking status: Former Smoker -- 1.00 packs/day for 28 years    Types: Cigarettes    Quit date: 01/02/2012  . Smokeless tobacco: Never Used  . Alcohol Use: No     Comment: last drink was 6 months ago per patient.  . Drug Use: No     Comment: History of illicit substance abuse (marijuna)  . Sexual Activity: Not on file   Other Topics Concern  . Not on file   Social History Narrative    ECOG Status: 1 - Symptomatic but completely ambulatory  Review of Systems: A 12 point ROS discussed and pertinent positives are indicated in the HPI above.  All other systems are negative.  Review of Systems  Constitutional: Negative for activity change, appetite change, fatigue and unexpected weight change.       Patient reports marked improvement in his appetite during  the past several weeks.  Eyes: Negative.   Respiratory: Negative.   Cardiovascular: Negative.   Gastrointestinal: Positive for abdominal distention. Negative for nausea, vomiting, diarrhea, constipation and blood in stool.       Patient reports chronic left mid abdominal pain.  Genitourinary: Negative.   Skin:       No complaint of yellowing of the skin.  Psychiatric/Behavioral: Positive for confusion.       The patient did report a transient episode of confusion during September of last year which lasted approximately 2-3 days and has resolved without intervention.    Vital Signs: BP 87/68 mmHg  Pulse 83  Temp(Src) 98.2 F (36.8 C) (Oral)  Resp 14  Ht 6' (1.829 m)  Wt 131 lb (59.421 kg)  BMI 17.76 kg/m2  SpO2 99%  Physical Exam  Constitutional:  Cachectic appearing male who appears  older than his stated age.  Eyes: Conjunctivae are normal.  Cardiovascular: Regular rhythm.   Pulmonary/Chest: Effort normal. He has wheezes.  Mild wheeze within the bilateral lung bases.  Abdominal: Soft. Bowel sounds are normal. He exhibits no distension and no mass. There is tenderness.  Mild tenderness with palpation of the right upper abdominal quadrant. A very small fluid wave is identified. No significant abdominal distention.  Skin: Skin is warm and dry.  No yellowing of the skin.  Psychiatric: He has a normal mood and affect. His behavior is normal.  Nursing note and vitals reviewed.   Imaging: US Paracentesis  11/10/2015  CLINICAL DATA:  Symptomatic ascites EXAM: ULTRASOUND GUIDED PARACENTESIS COMPARISON:  10/26/2015 PROCEDURE: An ultrasound guided paracentesis was thoroughly discussed with the patient and questions answered. The benefits, risks, alternatives and complications were also discussed. The patient understands and wishes to proceed with the procedure. Written consent was obtained. Ultrasound was performed to localize and mark an adequate pocket of fluid in the left abdomen.  The area was then prepped and draped in the normal sterile fashion. 1% Lidocaine was used for local anesthesia. Under ultrasound guidance a 19 gauge Yueh catheter was introduced. Paracentesis was performed. The catheter was removed and a dressing applied. COMPLICATIONS: None. FINDINGS: A total of approximately 2,800 cc of straw colored fluid was removed. A fluid sample was reserve for laboratory analysis if desired. IMPRESSION: Successful ultrasound guided paracentesis yielding 2.8 L of ascites. Electronically Signed   By: Monte Fantasia M.D.   On: 11/10/2015 15:02   US Paracentesis  10/26/2015  CLINICAL DATA:  Cirrhosis, ascites EXAM: ULTRASOUND GUIDED DIAGNOSTIC AND THERAPEUTIC PARACENTESIS COMPARISON:  None. PROCEDURE: Procedure, benefits, and risks of procedure were discussed with patient. Written informed consent for procedure was obtained. Time out protocol followed. Adequate collection of ascites localized by ultrasound in LEFT lower quadrant. Skin prepped and draped in usual sterile fashion. Skin and soft tissues anesthetized with 10 mL of 1% lidocaine. 5 [place] Yueh catheter placed into peritoneal cavity. 3500 mL of light yellow ascitic fluid aspirated by vacuum bottle suction. Procedure tolerated well by patient without immediate complication. COMPLICATIONS: None FINDINGS: A total of approximately 3500 mL of ascitic fluid was removed. A fluid sample of 180 mL was sent for laboratory analysis. IMPRESSION: Successful ultrasound guided paracentesis yielding 3500 mL of ascites. Electronically Signed   By: Lavonia Dana M.D.   On: 10/26/2015 15:59   PET/CT - 05/03/2015 CT abdomen pelvis performed - 04/14/2015  fluoroscopic guided transjugular liver biopsy - 07/24/2015 abdominal MRI - 09/20/2015  Labs:  CBC:  Recent Labs  08/24/15 1145 08/31/15 1456 09/26/15 1745 10/31/15 0926  WBC 12.9* 15.2* 12.8* 8.9  HGB 11.8* 11.5* 10.7* 9.7*  HCT 34.3* 34.8* 30.6* 28.9*  PLT 340 307 320 247     COAGS:  Recent Labs  04/17/15 0453 07/24/15 0655 08/31/15 1438 09/26/15 1745  INR 1.35 1.25 0.99 1.06  APTT  --  37  --   --     BMP:  Recent Labs  04/25/15 1728  08/08/15 1329  08/24/15 1145 08/31/15 1452 09/26/15 1745 10/31/15 0926  NA 134*  < > 134*  < > 134* 133* 133* 136  K 3.9  < > 4.6  < > 5.6* 4.5 4.2 4.4  CL 100*  < > 107  < > 102 104 103 108  CO2 23  < > 24  < > '27 23 25 23  ' GLUCOSE 276*  < > 124*  < >  115* 130* 96 79  BUN 7  < > 24*  < > 22* 24 27* 13  CALCIUM 8.3*  < > 7.5*  < > 7.9* 7.4* 7.7* 8.0*  CREATININE 0.99  < > 0.81  < > 1.21 0.93 0.97 0.80  GFRNONAA >60  --  >60  --  >60  --  >60  --   GFRAA >60  --  >60  --  >60  --  >60  --   < > = values in this interval not displayed.  LIVER FUNCTION TESTS:  Recent Labs  08/23/15 1007 08/31/15 1452 09/26/15 1745 10/31/15 0926  BILITOT 0.3 0.5 0.3 0.3  AST '19 22 26 22  ' ALT '13 15 19 13  ' ALKPHOS 116* 118* 115 58  PROT 4.7* 4.4* 4.9* 4.6*  ALBUMIN 1.7* 1.6* 2.1* 2.5*    TUMOR MARKERS:  Recent Labs  04/17/15 0453 08/31/15 1452  AFPTM 1.3 2.3  CEA 5.6*  --   CA199 78*  --     Calculated MELD score - 7 (all labs from 12/27; except INR value which was from 09/26/2015).  Assessment and Plan:  KEEVEN MATTY is a 55 y.o. male with past medical history significant for pancreatic cancer (post Whipple), lung cancer (both of which are currently in remission), smoking, CAD (post myocardial infarction) and cirrhosis who presents to the interventional radiology clinic for evaluation of potential percutaneous treatment options for recurrent symptomatic ascites for which the patient has undergone near weekly paracenteses since June of this past year.   Review of recent imaging is negative for evidence of locally recurrent or metastatic disease. Abdominal MRI performed 11/60/2016 was negative for evidence of primary hepatic malignancy. The portal vein appears widely patent.  Calculated meld score of 7  suggests this patient would be a fine candidate for TIPS procedure.   As such, prolonged conversations were held with the patient regarding the benefits and risks (including but not limited to complications associated with general anesthesia, contrast and radiation exposure, vessel injury/bleeding, incomplete resolution of ascites, development of congestive heart failure, altered mental status secondary to elevated ammonia levels) of TIPS procedure.  I explained the procedure in detail to the patient and explained the procedure will entail general anesthesia and an overnight admission to [facility] for continued observation.    The patient is most concerned with developing encephalopathy following the procedure. I explained to the patient that besides the transient episode in September, he has not experienced any altered mental status and that there are several medications which could be utilized if he were to develop these symptoms following the procedure.  Following this prolonged and detailed discussion, the patient wishes to continue to pursue a conservative management for the next 2-3 months. As stated above, he has noted a subjective improvement in his ascites reaccumulation during the past several weeks which is been associated with increased appetite and his gain approximately 13 pounds.   As such, the patient will return to the interventional radiology clinic in approximately 2-3 months for repeat consultation. The patient was instructed to call the interventional radiology clinic with any interval questions or concerns.  Thank you for this interesting consult.  I greatly enjoyed meeting Patrick Lee and look forward to participating in his care.  A copy of this report was sent to the requesting provider on this date.  Electronically Signed: Sandi Mariscal 11/16/2015, 5:11 PM   I spent a total of 30 Minutes in face to face in  clinical consultation, greater than 50% of which was  counseling/coordinating care for TIPS evaluation.

## 2015-11-17 LAB — CULTURE, BODY FLUID-BOTTLE

## 2015-11-17 LAB — CULTURE, BODY FLUID W GRAM STAIN -BOTTLE: Culture: NO GROWTH

## 2015-12-12 ENCOUNTER — Ambulatory Visit (INDEPENDENT_AMBULATORY_CARE_PROVIDER_SITE_OTHER): Payer: Medicare Other | Admitting: Internal Medicine

## 2015-12-13 ENCOUNTER — Encounter (INDEPENDENT_AMBULATORY_CARE_PROVIDER_SITE_OTHER): Payer: Self-pay | Admitting: Internal Medicine

## 2015-12-14 ENCOUNTER — Encounter (INDEPENDENT_AMBULATORY_CARE_PROVIDER_SITE_OTHER): Payer: Self-pay | Admitting: Internal Medicine

## 2015-12-14 ENCOUNTER — Ambulatory Visit (INDEPENDENT_AMBULATORY_CARE_PROVIDER_SITE_OTHER): Payer: Medicare Other | Admitting: Internal Medicine

## 2015-12-14 VITALS — BP 100/82 | HR 84 | Temp 98.2°F | Ht 72.0 in | Wt 136.8 lb

## 2015-12-14 DIAGNOSIS — K7031 Alcoholic cirrhosis of liver with ascites: Secondary | ICD-10-CM | POA: Diagnosis not present

## 2015-12-14 DIAGNOSIS — K858 Other acute pancreatitis without necrosis or infection: Secondary | ICD-10-CM

## 2015-12-14 MED ORDER — ENSURE ENLIVE PO LIQD: 237.0000 mL | Freq: Two times a day (BID) | ORAL | Status: DC
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
## 2015-12-14 NOTE — Patient Instructions (Signed)
OV in 3 months. CBC with diff, CMET, AFP, PT/INR.

## 2015-12-15 LAB — COMPREHENSIVE METABOLIC PANEL
ALT: 17 U/L (ref 9–46)
AST: 29 U/L (ref 10–35)
Albumin: 2.2 g/dL — ABNORMAL LOW (ref 3.6–5.1)
Alkaline Phosphatase: 89 U/L (ref 40–115)
BUN: 15 mg/dL (ref 7–25)
CALCIUM: 8 mg/dL — AB (ref 8.6–10.3)
CO2: 26 mmol/L (ref 20–31)
Chloride: 105 mmol/L (ref 98–110)
Creat: 0.73 mg/dL (ref 0.70–1.33)
GLUCOSE: 88 mg/dL (ref 65–99)
POTASSIUM: 4.3 mmol/L (ref 3.5–5.3)
Sodium: 137 mmol/L (ref 135–146)
Total Bilirubin: 0.4 mg/dL (ref 0.2–1.2)
Total Protein: 4.5 g/dL — ABNORMAL LOW (ref 6.1–8.1)

## 2015-12-15 LAB — CBC WITH DIFFERENTIAL/PLATELET
BASOS PCT: 0 % (ref 0–1)
Basophils Absolute: 0 10*3/uL (ref 0.0–0.1)
Eosinophils Absolute: 0.1 10*3/uL (ref 0.0–0.7)
Eosinophils Relative: 1 % (ref 0–5)
HCT: 30.1 % — ABNORMAL LOW (ref 39.0–52.0)
HEMOGLOBIN: 10.1 g/dL — AB (ref 13.0–17.0)
LYMPHS ABS: 1.1 10*3/uL (ref 0.7–4.0)
Lymphocytes Relative: 11 % — ABNORMAL LOW (ref 12–46)
MCH: 34.1 pg — AB (ref 26.0–34.0)
MCHC: 33.6 g/dL (ref 30.0–36.0)
MCV: 101.7 fL — AB (ref 78.0–100.0)
MONOS PCT: 6 % (ref 3–12)
MPV: 9.2 fL (ref 8.6–12.4)
Monocytes Absolute: 0.6 10*3/uL (ref 0.1–1.0)
NEUTROS ABS: 8 10*3/uL — AB (ref 1.7–7.7)
NEUTROS PCT: 82 % — AB (ref 43–77)
Platelets: 293 10*3/uL (ref 150–400)
RBC: 2.96 MIL/uL — ABNORMAL LOW (ref 4.22–5.81)
RDW: 13.4 % (ref 11.5–15.5)
WBC: 9.7 10*3/uL (ref 4.0–10.5)

## 2015-12-15 LAB — PROTIME-INR
INR: 1.22 (ref <1.50)
PROTHROMBIN TIME: 15.6 s — AB (ref 11.6–15.2)

## 2015-12-15 LAB — AFP TUMOR MARKER: AFP TUMOR MARKER: 1.8 ng/mL (ref <6.1)

## 2015-12-19 ENCOUNTER — Telehealth (INDEPENDENT_AMBULATORY_CARE_PROVIDER_SITE_OTHER): Payer: Self-pay | Admitting: *Deleted

## 2015-12-19 ENCOUNTER — Telehealth (INDEPENDENT_AMBULATORY_CARE_PROVIDER_SITE_OTHER): Payer: Self-pay | Admitting: Internal Medicine

## 2015-12-19 DIAGNOSIS — R188 Other ascites: Secondary | ICD-10-CM

## 2015-12-19 NOTE — Telephone Encounter (Signed)
US paracentesis ordered 

## 2015-12-19 NOTE — Telephone Encounter (Signed)
Patient needs tap, please put order in, thanks

## 2015-12-19 NOTE — Telephone Encounter (Signed)
US ordered

## 2015-12-20 ENCOUNTER — Encounter (HOSPITAL_COMMUNITY): Payer: Self-pay

## 2015-12-20 ENCOUNTER — Ambulatory Visit (HOSPITAL_COMMUNITY)
Admission: RE | Admit: 2015-12-20 | Discharge: 2015-12-20 | Disposition: A | Discharge status: Home / Self Care | Payer: Medicare Other | Source: Ambulatory Visit | Attending: Internal Medicine | Admitting: Internal Medicine

## 2015-12-20 VITALS — BP 100/72 | HR 74 | Temp 97.6°F | Resp 16

## 2015-12-20 DIAGNOSIS — C3490 Malignant neoplasm of unspecified part of unspecified bronchus or lung: Secondary | ICD-10-CM | POA: Diagnosis not present

## 2015-12-20 DIAGNOSIS — R188 Other ascites: Secondary | ICD-10-CM | POA: Insufficient documentation

## 2015-12-20 LAB — BODY FLUID CELL COUNT WITH DIFFERENTIAL
Eos, Fluid: 0 %
Lymphs, Fluid: 9 %
Monocyte-Macrophage-Serous Fluid: 56 % (ref 50–90)
Neutrophil Count, Fluid: 35 % — ABNORMAL HIGH (ref 0–25)
WBC FLUID: 131 uL (ref 0–1000)

## 2015-12-20 LAB — GRAM STAIN

## 2015-12-20 MED ORDER — ALBUMIN HUMAN 25 % IV SOLN
50.0000 g | Freq: Once | INTRAVENOUS | Status: AC
  Administered 2015-12-20: 50 g via INTRAVENOUS

## 2015-12-20 MED ORDER — ALBUMIN HUMAN 25 % IV SOLN
INTRAVENOUS | Status: AC
  Filled 2015-12-20: qty 200

## 2015-12-20 NOTE — Sedation Documentation (Signed)
Paracentesis completed at this time. 2550 mL of clear/yellow fluid collected. No acute distress noted. PT denies any pain or SOB.

## 2015-12-20 NOTE — Procedures (Signed)
PreOperative Dx: Lung cancer, ascites Postoperative Dx: Lung cancer, ascites Procedure:   US guided paracentesis Radiologist:  Thornton Papas Anesthesia:  9 ml of 1% lidocaine Specimen:  2550 ml of straw colored ascitic fluid EBL:   < 1 ml Complications: None

## 2015-12-20 NOTE — Telephone Encounter (Signed)
abd tap sch'd for 12/20/15

## 2015-12-21 LAB — PATHOLOGIST SMEAR REVIEW

## 2015-12-25 LAB — CULTURE, BODY FLUID-BOTTLE

## 2015-12-25 LAB — CULTURE, BODY FLUID W GRAM STAIN -BOTTLE: Culture: NO GROWTH

## 2015-12-30 ENCOUNTER — Other Ambulatory Visit (INDEPENDENT_AMBULATORY_CARE_PROVIDER_SITE_OTHER): Payer: Self-pay | Admitting: Internal Medicine

## 2016-01-15 ENCOUNTER — Other Ambulatory Visit (INDEPENDENT_AMBULATORY_CARE_PROVIDER_SITE_OTHER): Payer: Self-pay | Admitting: *Deleted

## 2016-01-15 DIAGNOSIS — K7460 Unspecified cirrhosis of liver: Secondary | ICD-10-CM

## 2016-01-15 DIAGNOSIS — R188 Other ascites: Principal | ICD-10-CM

## 2016-01-17 ENCOUNTER — Ambulatory Visit (HOSPITAL_COMMUNITY): Admission: RE | Admit: 2016-01-17 | Payer: Medicare Other | Source: Ambulatory Visit

## 2016-01-18 ENCOUNTER — Encounter (HOSPITAL_COMMUNITY): Payer: Self-pay

## 2016-01-18 ENCOUNTER — Ambulatory Visit (HOSPITAL_COMMUNITY)
Admission: RE | Admit: 2016-01-18 | Discharge: 2016-01-18 | Disposition: A | Discharge status: Home / Self Care | Payer: Medicare Other | Source: Ambulatory Visit | Attending: Internal Medicine | Admitting: Internal Medicine

## 2016-01-18 DIAGNOSIS — K7460 Unspecified cirrhosis of liver: Secondary | ICD-10-CM | POA: Insufficient documentation

## 2016-01-18 DIAGNOSIS — C259 Malignant neoplasm of pancreas, unspecified: Secondary | ICD-10-CM | POA: Insufficient documentation

## 2016-01-18 DIAGNOSIS — R188 Other ascites: Secondary | ICD-10-CM

## 2016-01-18 LAB — BODY FLUID CELL COUNT WITH DIFFERENTIAL
EOS FL: 0 %
LYMPHS FL: 21 %
MONOCYTE-MACROPHAGE-SEROUS FLUID: 17 % — AB (ref 50–90)
Neutrophil Count, Fluid: 62 % — ABNORMAL HIGH (ref 0–25)
Total Nucleated Cell Count, Fluid: 96 cu mm (ref 0–1000)

## 2016-01-18 LAB — GRAM STAIN

## 2016-01-18 MED ORDER — ALBUMIN HUMAN 25 % IV SOLN
50.0000 g | Freq: Once | INTRAVENOUS | Status: AC
  Administered 2016-01-18: 50 g via INTRAVENOUS

## 2016-01-18 MED ORDER — ALBUMIN HUMAN 25 % IV SOLN
INTRAVENOUS | Status: AC
  Administered 2016-01-18: 50 g via INTRAVENOUS
  Filled 2016-01-18: qty 200

## 2016-01-18 NOTE — Progress Notes (Signed)
Paracentesis complete no signs of distress. 4800 ml yellow colored ascites removed.

## 2016-01-18 NOTE — Procedures (Signed)
PreOperative Dx: Cirrhosis, pancreastic cancer, ascites Postoperative Dx: Cirrhosis, pancreatic cancer, ascites Procedure:   US guided paracentesis Radiologist:  Thornton Papas Anesthesia:  10 ml of1% lidocaine Specimen:  4800 ml of yellow ascitic fluid EBL:   < 1 ml Complications: None

## 2016-01-19 LAB — PATHOLOGIST SMEAR REVIEW

## 2016-01-23 LAB — CULTURE, BODY FLUID-BOTTLE

## 2016-01-23 LAB — CULTURE, BODY FLUID W GRAM STAIN -BOTTLE: Culture: NO GROWTH

## 2016-01-31 ENCOUNTER — Other Ambulatory Visit (HOSPITAL_COMMUNITY): Payer: Self-pay | Admitting: Interventional Radiology

## 2016-01-31 DIAGNOSIS — R1011 Right upper quadrant pain: Secondary | ICD-10-CM

## 2016-01-31 DIAGNOSIS — K7460 Unspecified cirrhosis of liver: Secondary | ICD-10-CM

## 2016-01-31 DIAGNOSIS — E43 Unspecified severe protein-calorie malnutrition: Secondary | ICD-10-CM

## 2016-01-31 DIAGNOSIS — R188 Other ascites: Principal | ICD-10-CM

## 2016-01-31 DIAGNOSIS — R634 Abnormal weight loss: Secondary | ICD-10-CM

## 2016-02-05 ENCOUNTER — Other Ambulatory Visit (INDEPENDENT_AMBULATORY_CARE_PROVIDER_SITE_OTHER): Payer: Self-pay | Admitting: Internal Medicine

## 2016-02-08 ENCOUNTER — Ambulatory Visit
Admission: RE | Admit: 2016-02-08 | Discharge: 2016-02-08 | Disposition: A | Discharge status: Home / Self Care | Payer: Medicare Other | Source: Ambulatory Visit | Attending: Interventional Radiology | Admitting: Interventional Radiology

## 2016-02-08 DIAGNOSIS — R188 Other ascites: Principal | ICD-10-CM

## 2016-02-08 DIAGNOSIS — R1011 Right upper quadrant pain: Secondary | ICD-10-CM

## 2016-02-08 DIAGNOSIS — E43 Unspecified severe protein-calorie malnutrition: Secondary | ICD-10-CM

## 2016-02-08 DIAGNOSIS — K7460 Unspecified cirrhosis of liver: Secondary | ICD-10-CM

## 2016-02-08 DIAGNOSIS — R634 Abnormal weight loss: Secondary | ICD-10-CM

## 2016-02-08 NOTE — Progress Notes (Signed)
Patient ID: Patrick Lee, male   DOB: [birth year], 55 y.o.   MRN: [id number]        Chief Complaint: Recurrent ascites, TIPS evaluation  Referring Physician(s): Rehman  History of Present Illness: Patrick Lee is a 55 y.o. male with past history significant for pancreatic cancer (post Whipple) and lung cancer (both of which are currently in remission), smoking, CAD (post myocardial infarction) and cirrhosis who presents to the interventional radiology clinic for evaluation of potential percutaneous treatment options for recurrent symptomatic ascites. The patient is unaccompanied and serves as his own historian.  The patient was initially seen in consultation on 11/16/2015.  At that time, the patient had been undergoing a near weekly paracenteses since June of last year however at the time of initial presentation, the patient subjectively noted decreased reaccumulation of his ascitic fluid and following a detailed discussion regarding the potential risks and benefits of undergoing a TIPS procedure, the patient wished to pursue conservative management.  Since the time of initial consultation, the patient has only undergone 2 subsequent ultrasound-guided paracentesis on 10/08/2016 which yielded 2.5 L and another on 01/18/2016 which yielded approximately 4.8 L.   Since his last paracentesis (which was performed nearly 3 weeks ago), the patient states he has not developed any recurrent ascites.  Patient states that his appetite has significantly improved as has his energy level.  Patient did report a transient episode of confusion in September of last year though states he has experienced no similar episodes since that time. He denies unintentional weight loss or weight gain. No yellowing of the skin or eyes. No change in bowel function. No bloody or melanotic stools. No hematemesis.  Past Medical History  Diagnosis Date  . Lateral epicondylitis   . Chest pain, unspecified   . Abdominal pain, other  specified site   . Obstruction of bile duct   . Tobacco use disorder   . Anxiety   . Shortness of breath     uses oxygen at night- 2 liters   . Myocardial infarction [facility]) 2007    during [place]- 2007  . Complication of anesthesia     pt. reports that he had MI while under anesth. for Whipple procedure  . Pancreatic cancer ([place])      status post Whipple procedure.  . Lung cancer ([place])   . Macrocytic anemia 10/10/2015    Past Surgical History  Procedure Laterality Date  . Whipple procedure  2007  . Percutaneous extraction of gallstones      [facility]  . Knee arthroscopy      R knee- at [facility]  . Cholecystectomy      open  . Video bronchoscopy  04/08/2012    Procedure: VIDEO BRONCHOSCOPY;  Surgeon: Gaye Pollack, MD;  Location: [facility] OR;  Service: Thoracic;  Laterality: N/A;  . Partial right lung removal    . Colonoscopy  May 2014    Dr. Anthony Sar: normal   . Esophagogastroduodenoscopy  Dec 2015    [facility]. normal esophagus, patent afferent and efferent libs, no abnormalities in jejunum.   . Bone marrow biopsy Left 05/25/15  . Bone marrow aspiration Left 05/25/15    Allergies: Review of patient's allergies indicates no known allergies.  Medications: Prior to Admission medications   Medication Sig Start Date End Date Taking? Authorizing Provider  apixaban (ELIQUIS) 2.5 MG TABS tablet Take 1 tablet (2.5 mg total) by mouth 2 (two) times daily. 09/11/15  Yes Kelby Fam  [place], MD  feeding supplement, ENSURE ENLIVE, (ENSURE ENLIVE) LIQD Take 237 mLs by mouth 2 (two) times daily between meals. 12/14/15  Yes Butch Penny, NP  furosemide (LASIX) 40 MG tablet TAKE ONE TABLET BY MOUTH ONCE DAILY 02/05/16  Yes Butch Penny, NP  ondansetron (ZOFRAN) 4 MG tablet Take 1 tablet (4 mg total) by mouth 4 (four) times daily -  with meals and at bedtime. 06/20/15  Yes Orvil Feil, NP  OVER THE COUNTER MEDICATION Apply 1 application topically as needed (for hemorrhoids).  OTC Hemorrhoid cream   Yes Historical Provider, MD  spironolactone (ALDACTONE) 50 MG tablet Take 2 tablets (100 mg total) by mouth daily. Patient taking differently: Take 50 mg by mouth daily. 71m daily 08/10/15  Yes TButch Penny NP  oxyCODONE (OXY IR/ROXICODONE) 5 MG immediate release tablet Take 1 tablet (5 mg total) by mouth 2 (two) times daily as needed for severe pain. Patient not taking: Reported on 12/14/2015 10/31/15   TButch Penny NP     Family History  Problem Relation Age of Onset  . Stroke Father 65   Deceased  . Anesthesia problems Neg Hx   . Colon cancer Neg Hx   . Cirrhosis Neg Hx     Social History   Social History  . Marital Status: Divorced    Spouse Name: N/A  . Number of Children: 4  . Years of Education: N/A   Occupational History  . UNEMPLOYED    Social History Main Topics  . Smoking status: Former Smoker -- 1.00 packs/day for 28 years    Types: Cigarettes    Quit date: 01/02/2012  . Smokeless tobacco: Never Used  . Alcohol Use: No     Comment: last drink was 6 months ago per patient.  . Drug Use: No     Comment: History of illicit substance abuse (marijuna)  . Sexual Activity: Not on file   Other Topics Concern  . Not on file   Social History Narrative    ECOG Status: 1 - Symptomatic but completely ambulatory  Review of Systems: A 12 point ROS discussed and pertinent positives are indicated in the HPI above.  All other systems are negative.  Review of Systems  Constitutional: Positive for activity change and appetite change. Negative for fever.  Respiratory: Negative.   Cardiovascular: Negative.   Gastrointestinal: Negative for abdominal pain, diarrhea, constipation and abdominal distention.  Skin: Negative for color change.       Patient does admit to easy bruisability involving primarily his bilateral upper extremity.  Hematological: Bruises/bleeds easily.  Psychiatric/Behavioral: Positive for confusion.    Vital Signs: BP  93/61 mmHg  Pulse 78  Temp(Src) 97.9 F (36.6 C) (Oral)  Resp 14  Ht 6' (1.829 m)  Wt 145 lb 9.6 oz (66.044 kg)  BMI 19.74 kg/m2  SpO2 100%  Physical Exam  Constitutional: He appears well-developed and well-nourished.  HENT:  Head: Normocephalic and atraumatic.  Eyes: Conjunctivae are normal.  Cardiovascular: Normal rate and regular rhythm.   Pulmonary/Chest: Effort normal and breath sounds normal.  Abdominal: Soft. Bowel sounds are normal. He exhibits no distension. There is no tenderness.  I believe I am able to palpate only a very subtle fluid wave.  Skin:  Multiple bruises are noted about his bilateral forearms and hands.  Psychiatric: He has a normal mood and affect. His behavior is normal. Judgment and thought content normal.  Nursing note and vitals reviewed.   Imaging: UKorea  Paracentesis  01/18/2016  INDICATION: Cirrhosis, ascites, history pancreatic cancer and lung cancer EXAM: ULTRASOUND GUIDED DIAGNOSTIC AND THERAPEUTIC PARACENTESIS PROCEDURE: Procedure, benefits, and risks of procedure were discussed with patient. Written informed consent for procedure was obtained. Time out protocol followed. Ascites localized by ultrasound in the LEFT lower quadrant Skin prepped and draped in usual sterile fashion. Skin and soft tissues anesthetized with 10 mL of 1% lidocaine. 5 [place] Yueh catheter placed into the ascites in the LEFT lower quadrant. Forty 100 mL of yellow ascitic fluid was aspirated by vacuum bottle suction. Procedure tolerated well by patient without immediate complication. FINDINGS: A total of approximately 4800 mL of ascitic fluid was removed. A fluid sample of 180 mL was sent for requested laboratory analysis. IMPRESSION: Successful ultrasound guided paracentesis yielding 4800 mL of ascitic fluid. Electronically Signed   By: Lavonia Dana M.D.   On: 01/18/2016 13:03    Labs:  CBC:  Recent Labs  08/31/15 1456 09/26/15 1745 10/31/15 0926 12/14/15 1516  WBC 15.2*  12.8* 8.9 9.7  HGB 11.5* 10.7* 9.7* 10.1*  HCT 34.8* 30.6* 28.9* 30.1*  PLT 307 320 247 293    COAGS:  Recent Labs  07/24/15 0655 08/31/15 1438 09/26/15 1745 12/14/15 1516  INR 1.25 0.99 1.06 1.22  APTT 37  --   --   --     BMP:  Recent Labs  04/25/15 1728  08/08/15 1329  08/24/15 1145 08/31/15 1452 09/26/15 1745 10/31/15 0926 12/14/15 1458  NA 134*  < > 134*  < > 134* 133* 133* 136 137  K 3.9  < > 4.6  < > 5.6* 4.5 4.2 4.4 4.3  CL 100*  < > 107  < > 102 104 103 108 105  CO2 23  < > 24  < > '27 23 25 23 26  ' GLUCOSE 276*  < > 124*  < > 115* 130* 96 79 88  BUN 7  < > 24*  < > 22* 24 27* 13 15  CALCIUM 8.3*  < > 7.5*  < > 7.9* 7.4* 7.7* 8.0* 8.0*  CREATININE 0.99  < > 0.81  < > 1.21 0.93 0.97 0.80 0.73  GFRNONAA >60  --  >60  --  >60  --  >60  --   --   GFRAA >60  --  >60  --  >60  --  >60  --   --   < > = values in this interval not displayed.  LIVER FUNCTION TESTS:  Recent Labs  08/31/15 1452 09/26/15 1745 10/31/15 0926 12/14/15 1458  BILITOT 0.5 0.3 0.3 0.4  AST '22 26 22 29  ' ALT '15 19 13 17  ' ALKPHOS 118* 115 58 89  PROT 4.4* 4.9* 4.6* 4.5*  ALBUMIN 1.6* 2.1* 2.5* 2.2*    TUMOR MARKERS:  Recent Labs  04/17/15 0453 08/31/15 1452 12/14/15 1516  AFPTM 1.3 2.3 1.8  CEA 5.6*  --   --   CA199 78*  --   --     Assessment and Plan:  Patrick Lee is a 55 y.o. male with past history significant for pancreatic cancer (post Whipple) and lung cancer (both of which are currently in remission), smoking, CAD (post myocardial infarction) and cirrhosis who returns to the interventional radiology clinic for evaluation of potential percutaneous treatment options for recurrent symptomatic ascites.   The patient was initially seen in consultation on 11/16/2015.  At that time, the patient had been undergoing a near weekly paracenteses since  June of last year however at the time of initial presentation, the patient subjectively noted decreased reaccumulation of his  ascitic fluid and ultimately decided to pursue conservative management.    Since the time of initial consultation, the patient has only undergone 2 subsequent ultrasound-guided paracentesis on 10/08/2016 which yielded 2.5 L and another on 01/18/2016 which yielded approximately 4.8 L.    Since the patient's last paracentesis, performed approximately 3 weeks ago, the patient subjectively does not feel he has developed any recurrent ascites and I am only able to palpate a very subtle fluid wave on physical examination. Importantly, there is no significant abdominal distention.  I again spent time educating the patient regarding the risks and benefits of undergoing a TIPS procedure.  I explained to the patient that the procedure would be performed solely for the purposes of his ascites management. Following this prolonged and detailed discussion, and as he is currently is not requiring frequent large volume paracentesis, he wishes to pursue continued conservative management.  Again, the patient remains concerned about developing confusion/encephalopathy following a TIPS procedure. I feel in the absence of additional complications associated with portal venous hypertension (most importantly bleeding varices), this is a reasonable decision.  As such, the patient will be seen in follow-up consultation at the IR clinic in approximately 3 months (July 2017).   If the patient ultimately wishes to pursue a TIPS procedure, I would order a preprocedural abdominal MRI both to ensure patency of the hepatic and portal veins as well as to ensure no evidence of primary hepatocellular or hepatic metastatic disease (given his history of pancreatic and lung cancer).  The patient was encouraged to call the interventional radiology clinic with any interval questions or concerns.  A copy of this report was sent to the requesting provider on this date.  Electronically Signed: Sandi Mariscal 02/08/2016, 4:36 PM   I spent a  total of 15 Minutes in face to face in clinical consultation, greater than 50% of which was counseling/coordinating care for recurrent ascites, TIPS evaluation

## 2016-03-13 ENCOUNTER — Ambulatory Visit (INDEPENDENT_AMBULATORY_CARE_PROVIDER_SITE_OTHER): Payer: Medicare Other | Admitting: Internal Medicine

## 2016-03-20 ENCOUNTER — Other Ambulatory Visit (INDEPENDENT_AMBULATORY_CARE_PROVIDER_SITE_OTHER): Payer: Self-pay | Admitting: Internal Medicine

## 2016-03-27 ENCOUNTER — Ambulatory Visit (INDEPENDENT_AMBULATORY_CARE_PROVIDER_SITE_OTHER): Payer: Medicare Other | Admitting: Internal Medicine

## 2016-03-28 ENCOUNTER — Encounter (INDEPENDENT_AMBULATORY_CARE_PROVIDER_SITE_OTHER): Payer: Self-pay | Admitting: *Deleted

## 2016-03-28 ENCOUNTER — Ambulatory Visit (INDEPENDENT_AMBULATORY_CARE_PROVIDER_SITE_OTHER): Payer: Medicare Other | Admitting: Internal Medicine

## 2016-03-28 ENCOUNTER — Encounter (INDEPENDENT_AMBULATORY_CARE_PROVIDER_SITE_OTHER): Payer: Self-pay | Admitting: Internal Medicine

## 2016-03-28 VITALS — BP 100/72 | HR 64 | Temp 98.0°F | Ht 72.0 in | Wt 131.5 lb

## 2016-03-28 DIAGNOSIS — K745 Biliary cirrhosis, unspecified: Secondary | ICD-10-CM

## 2016-03-28 NOTE — Progress Notes (Signed)
Subjective:    Patient ID: Patrick Lee, male    DOB: 12-Nov-1960, 55 y.o.   MRN: [id number] PCP Kevan Ny in [place]. HPI Here today for f/u of his cirrhosis.He was last seen in February. His last paracentesis was in March of this year with the removal of 4800cc fluid. Cultures negative for SBP. He has had multiple paracentesis in the pat.  He says he is doing good. He has upper abdominal pain which at times will last about an hour. Hx of chronic upper abdominal pain.  He was evaluated by Dr. Sandi Mariscal for considerations of TIPs but declined. He will be revevaluated soon.  His appetite is good. His last weight was 136. Today his weight is 131.5.  BM daily x 1-3. No melena or BRRB. No etoh since 2015. Hx of pancreatic cancer and underwent a Whipple Procedure at [facility] in 2007. Hx of partial rt lobectomy in 2013  at [facility]. Auto immune process and Hepatitis ruled out for his liver disease.  Maintained on Eliquis for recent hx of PE.       Review of Systems Past Medical History  Diagnosis Date  . Lateral epicondylitis   . Chest pain, unspecified   . Abdominal pain, other specified site   . Obstruction of bile duct   . Tobacco use disorder   . Anxiety   . Shortness of breath     uses oxygen at night- 2 liters   . Myocardial infarction [facility]) 2007    during [place]- 2007  . Complication of anesthesia     pt. reports that he had MI while under anesth. for Whipple procedure  . Pancreatic cancer ([place])      status post Whipple procedure.  . Lung cancer ([place])   . Macrocytic anemia 10/10/2015    Past Surgical History  Procedure Laterality Date  . Whipple procedure  2007  . Percutaneous extraction of gallstones      2010[facility]  . Knee arthroscopy      R knee- at [facility]  . Cholecystectomy      open  . Video bronchoscopy  04/08/2012    Procedure: VIDEO BRONCHOSCOPY;  Surgeon: Gaye Pollack, MD;  Location: [facility] OR;  Service: Thoracic;  Laterality: N/A;    . Partial right lung removal    . Colonoscopy  May 2014    Dr. Anthony Sar: normal   . Esophagogastroduodenoscopy  Dec 2015    [facility]. normal esophagus, patent afferent and efferent libs, no abnormalities in jejunum.   . Bone marrow biopsy Left 05/25/15  . Bone marrow aspiration Left 05/25/15    No Known Allergies  Current Outpatient Prescriptions on File Prior to Visit  Medication Sig Dispense Refill  . apixaban (ELIQUIS) 2.5 MG TABS tablet Take 1 tablet (2.5 mg total) by mouth 2 (two) times daily. 60 tablet 3  . feeding supplement, ENSURE ENLIVE, (ENSURE ENLIVE) LIQD Take 237 mLs by mouth 2 (two) times daily between meals. 237 mL 12  . furosemide (LASIX) 40 MG tablet TAKE ONE TABLET BY MOUTH ONCE DAILY 30 tablet 0  . ondansetron (ZOFRAN) 4 MG tablet Take 1 tablet (4 mg total) by mouth 4 (four) times daily -  with meals and at bedtime. 120 tablet 3  . OVER THE COUNTER MEDICATION Apply 1 application topically as needed (for hemorrhoids). OTC Hemorrhoid cream    . oxyCODONE (OXY IR/ROXICODONE) 5 MG immediate release tablet Take 1 tablet (5 mg total) by mouth  2 (two) times daily as needed for severe pain. (Patient not taking: Reported on 12/14/2015) 60 tablet 0  . spironolactone (ALDACTONE) 50 MG tablet Take 2 tablets (100 mg total) by mouth daily. (Patient taking differently: Take 50 mg by mouth daily. 96m daily) 100 tablet 3   No current facility-administered medications on file prior to visit.        Objective:   Physical Exam Blood pressure 100/72, pulse 64, temperature 98 F (36.7 C), height 6' (1.829 m), weight 131 lb 8 oz (59.648 kg). Alert and oriented. Skin warm and dry. Oral mucosa is moist.   . Sclera anicteric, conjunctivae is pink. Thyroid not enlarged. No cervical lymphadenopathy. Lungs clear. Heart regular rate and rhythm.  Abdomen is soft. Bowel sounds are positive. No hepatomegaly. No abdominal masses felt. Upper abdominal pain.  No edema to lower extremities.          Assessment & Plan:  Cirrhosis. CBC, Hepatic function, PT/INR, AFP, UKoreaabdomen OV in 6 months.  Patient requested refill on his Oxycodone and I advised him that he needed to see his PCP for this. He said, We'll I might as well start drinking again, but then he said he wouldn't

## 2016-03-28 NOTE — Patient Instructions (Signed)
CBC, Hepatic function , AFP, US abdomen, PT/INR.  OV in 6 months.

## 2016-03-29 LAB — CBC WITH DIFFERENTIAL/PLATELET
BASOS ABS: 72 {cells}/uL (ref 0–200)
Basophils Relative: 1 %
EOS PCT: 5 %
Eosinophils Absolute: 360 cells/uL (ref 15–500)
HCT: 35.4 % — ABNORMAL LOW (ref 38.5–50.0)
Hemoglobin: 11.2 g/dL — ABNORMAL LOW (ref 13.2–17.1)
Lymphocytes Relative: 22 %
Lymphs Abs: 1584 cells/uL (ref 850–3900)
MCH: 31.3 pg (ref 27.0–33.0)
MCHC: 31.6 g/dL — AB (ref 32.0–36.0)
MCV: 98.9 fL (ref 80.0–100.0)
MONOS PCT: 10 %
MPV: 9.9 fL (ref 7.5–12.5)
Monocytes Absolute: 720 cells/uL (ref 200–950)
NEUTROS ABS: 4464 {cells}/uL (ref 1500–7800)
Neutrophils Relative %: 62 %
PLATELETS: 332 10*3/uL (ref 140–400)
RBC: 3.58 MIL/uL — AB (ref 4.20–5.80)
RDW: 13.6 % (ref 11.0–15.0)
WBC: 7.2 10*3/uL (ref 3.8–10.8)

## 2016-03-29 LAB — AFP TUMOR MARKER: AFP TUMOR MARKER: 3.3 ng/mL (ref <6.1)

## 2016-03-29 LAB — HEPATIC FUNCTION PANEL
ALBUMIN: 3.4 g/dL — AB (ref 3.6–5.1)
ALT: 18 U/L (ref 9–46)
AST: 26 U/L (ref 10–35)
Alkaline Phosphatase: 107 U/L (ref 40–115)
BILIRUBIN TOTAL: 0.3 mg/dL (ref 0.2–1.2)
Bilirubin, Direct: 0.1 mg/dL (ref <=0.2)
Indirect Bilirubin: 0.2 mg/dL (ref 0.2–1.2)
TOTAL PROTEIN: 6.1 g/dL (ref 6.1–8.1)

## 2016-03-29 LAB — PROTIME-INR
INR: 1.03 (ref <1.50)
Prothrombin Time: 13.6 seconds (ref 11.6–15.2)

## 2016-04-03 ENCOUNTER — Ambulatory Visit (HOSPITAL_COMMUNITY)
Admission: RE | Admit: 2016-04-03 | Discharge: 2016-04-03 | Disposition: A | Discharge status: Home / Self Care | Payer: Medicare Other | Source: Ambulatory Visit | Attending: Internal Medicine | Admitting: Internal Medicine

## 2016-04-03 DIAGNOSIS — K745 Biliary cirrhosis, unspecified: Secondary | ICD-10-CM

## 2016-04-03 DIAGNOSIS — R188 Other ascites: Secondary | ICD-10-CM | POA: Insufficient documentation

## 2016-04-03 DIAGNOSIS — R932 Abnormal findings on diagnostic imaging of liver and biliary tract: Secondary | ICD-10-CM | POA: Diagnosis not present

## 2016-04-03 DIAGNOSIS — I77811 Abdominal aortic ectasia: Secondary | ICD-10-CM | POA: Diagnosis not present

## 2016-04-11 ENCOUNTER — Other Ambulatory Visit (HOSPITAL_COMMUNITY): Payer: Self-pay | Admitting: Interventional Radiology

## 2016-04-11 DIAGNOSIS — R634 Abnormal weight loss: Secondary | ICD-10-CM

## 2016-04-11 DIAGNOSIS — K7460 Unspecified cirrhosis of liver: Secondary | ICD-10-CM

## 2016-04-11 DIAGNOSIS — R1011 Right upper quadrant pain: Secondary | ICD-10-CM

## 2016-04-11 DIAGNOSIS — E43 Unspecified severe protein-calorie malnutrition: Secondary | ICD-10-CM

## 2016-04-11 DIAGNOSIS — R188 Other ascites: Principal | ICD-10-CM

## 2016-05-10 ENCOUNTER — Emergency Department (HOSPITAL_COMMUNITY): Payer: Medicare Other

## 2016-05-10 ENCOUNTER — Emergency Department (HOSPITAL_COMMUNITY)
Admission: EM | Admit: 2016-05-10 | Discharge: 2016-05-10 | Disposition: A | Discharge status: Home / Self Care | Payer: Medicare Other | Attending: Emergency Medicine | Admitting: Emergency Medicine

## 2016-05-10 ENCOUNTER — Encounter (HOSPITAL_COMMUNITY): Payer: Self-pay | Admitting: Emergency Medicine

## 2016-05-10 DIAGNOSIS — S42031A Displaced fracture of lateral end of right clavicle, initial encounter for closed fracture: Secondary | ICD-10-CM | POA: Diagnosis not present

## 2016-05-10 DIAGNOSIS — I252 Old myocardial infarction: Secondary | ICD-10-CM | POA: Diagnosis not present

## 2016-05-10 DIAGNOSIS — Z85118 Personal history of other malignant neoplasm of bronchus and lung: Secondary | ICD-10-CM | POA: Insufficient documentation

## 2016-05-10 DIAGNOSIS — Y939 Activity, unspecified: Secondary | ICD-10-CM | POA: Diagnosis not present

## 2016-05-10 DIAGNOSIS — Y929 Unspecified place or not applicable: Secondary | ICD-10-CM | POA: Diagnosis not present

## 2016-05-10 DIAGNOSIS — Z87891 Personal history of nicotine dependence: Secondary | ICD-10-CM | POA: Diagnosis not present

## 2016-05-10 DIAGNOSIS — W1839XA Other fall on same level, initial encounter: Secondary | ICD-10-CM | POA: Diagnosis not present

## 2016-05-10 DIAGNOSIS — S4991XA Unspecified injury of right shoulder and upper arm, initial encounter: Secondary | ICD-10-CM | POA: Diagnosis present

## 2016-05-10 DIAGNOSIS — Y999 Unspecified external cause status: Secondary | ICD-10-CM | POA: Diagnosis not present

## 2016-05-10 MED ORDER — OXYCODONE-ACETAMINOPHEN 5-325 MG PO TABS: 1.0000 | ORAL_TABLET | ORAL | Status: DC | PRN

## 2016-05-10 MED ORDER — MORPHINE SULFATE (PF) 4 MG/ML IV SOLN
4.0000 mg | Freq: Once | INTRAVENOUS | Status: AC
  Administered 2016-05-10: 4 mg via INTRAVENOUS
  Filled 2016-05-10: qty 1

## 2016-05-10 NOTE — ED Provider Notes (Signed)
CSN: [id number]     Arrival date & time 05/10/16  1733 History   First MD Initiated Contact with Patient 05/10/16 1758     Chief Complaint  Patient presents with  . Shoulder Injury     (Consider location/radiation/quality/duration/timing/severity/associated sxs/prior Treatment) HPI Patient states he fell from standing landing on his right shoulder. He denies any head injury or loss of consciousness. He complains of right shoulder pain and pain with movement. Denies any numbness or weakness. He has a mild amount of right-sided upper chest pain after the fall. No shortness of breath. No nausea or vomiting. Past Medical History  Diagnosis Date  . Lateral epicondylitis   . Chest pain, unspecified   . Abdominal pain, other specified site   . Obstruction of bile duct   . Tobacco use disorder   . Anxiety   . Shortness of breath     uses oxygen at night- 2 liters   . Myocardial infarction [facility]) 2007    during [place]- 2007  . Complication of anesthesia     pt. reports that he had MI while under anesth. for Whipple procedure  . Pancreatic cancer ([place])      status post Whipple procedure.  . Lung cancer ([place])   . Macrocytic anemia 10/10/2015   Past Surgical History  Procedure Laterality Date  . Whipple procedure  2007  . Percutaneous extraction of gallstones      2010[facility]  . Knee arthroscopy      R knee- at [facility]  . Cholecystectomy      open  . Video bronchoscopy  04/08/2012    Procedure: VIDEO BRONCHOSCOPY;  Surgeon: Gaye Pollack, MD;  Location: [facility] OR;  Service: Thoracic;  Laterality: N/A;  . Partial right lung removal    . Colonoscopy  May 2014    Dr. Anthony Sar: normal   . Esophagogastroduodenoscopy  Dec 2015    [facility]. normal esophagus, patent afferent and efferent libs, no abnormalities in jejunum.   . Bone marrow biopsy Left 05/25/15  . Bone marrow aspiration Left 05/25/15   Family History  Problem Relation Age of Onset  . Stroke Father 39     Deceased  . Anesthesia problems Neg Hx   . Colon cancer Neg Hx   . Cirrhosis Neg Hx    Social History  Substance Use Topics  . Smoking status: Former Smoker -- 1.00 packs/day for 28 years    Types: Cigarettes    Quit date: 01/02/2012  . Smokeless tobacco: Never Used  . Alcohol Use: No     Comment: last drink was 6 months ago per patient.    Review of Systems  Constitutional: Negative for fever and chills.  Respiratory: Negative for shortness of breath.   Cardiovascular: Positive for chest pain.  Gastrointestinal: Negative for nausea, vomiting, abdominal pain and diarrhea.  Musculoskeletal: Positive for arthralgias. Negative for back pain and neck pain.  Skin: Negative for rash and wound.  Neurological: Negative for dizziness, syncope, weakness, light-headedness, numbness and headaches.  All other systems reviewed and are negative.     Allergies  Review of patient's allergies indicates no known allergies.  Home Medications   Prior to Admission medications   Medication Sig Start Date End Date Taking? Authorizing Provider  apixaban (ELIQUIS) 2.5 MG TABS tablet Take 1 tablet (2.5 mg total) by mouth 2 (two) times daily. Patient taking differently: Take 2.5 mg by mouth daily.  09/11/15  Yes Patrici Ranks, MD  furosemide (LASIX) 40 MG tablet TAKE ONE TABLET BY MOUTH ONCE DAILY 03/20/16  Yes Butch Penny, NP  ondansetron (ZOFRAN) 4 MG tablet Take 1 tablet (4 mg total) by mouth 4 (four) times daily -  with meals and at bedtime. 06/20/15  Yes Orvil Feil, NP  spironolactone (ALDACTONE) 50 MG tablet Take 2 tablets (100 mg total) by mouth daily. Patient taking differently: Take 50 mg by mouth daily. 56m daily 08/10/15  Yes TButch Penny NP  oxyCODONE-acetaminophen (PERCOCET) 5-325 MG tablet Take 1-2 tablets by mouth every 4 (four) hours as needed for severe pain. 05/10/16   DJulianne Rice MD   BP 120/74 mmHg  Pulse 83  Temp(Src) 98.7 F (37.1 C) (Temporal)  Resp 20  Ht 6'  (1.829 m)  Wt 131 lb (59.421 kg)  BMI 17.76 kg/m2  SpO2 100% Physical Exam  Constitutional: He is oriented to person, place, and time. He appears well-developed and well-nourished. No distress.  HENT:  Head: Normocephalic and atraumatic.  Mouth/Throat: Oropharynx is clear and moist.  No evidence of any scalp or facial trauma  Eyes: EOM are normal. Pupils are equal, round, and reactive to light.  Neck: Normal range of motion. Neck supple.  No posterior midline cervical tenderness to palpation.  Cardiovascular: Normal rate and regular rhythm.  Exam reveals no gallop and no friction rub.   No murmur heard. Pulmonary/Chest: Effort normal and breath sounds normal. No respiratory distress. He has no wheezes. He has no rales. He exhibits tenderness (very mild chest wall tenderness to palpation in the right upper chest. There is no crepitance or deformity.).  Abdominal: Soft. Bowel sounds are normal. He exhibits no distension and no mass. There is no tenderness. There is no rebound and no guarding.  Musculoskeletal: Normal range of motion. He exhibits tenderness. He exhibits no edema.  Patient with distal right clavicle tenderness to palpation. Appears high riding compared to the left clavicle. Patient with full range of motion of the right shoulder. Full range of motion of the right elbow and wrist. Distal pulses are equal and intact.  Neurological: He is alert and oriented to person, place, and time.  5/5 motor in all extremities. Sensation is fully intact.  Skin: Skin is warm and dry. No rash noted. No erythema.  Psychiatric: He has a normal mood and affect. His behavior is normal.  Nursing note and vitals reviewed.   ED Course  Procedures (including critical care time) Labs Review Labs Reviewed - No data to display  Imaging Review Dg Shoulder Right  05/10/2016  CLINICAL DATA:  FGolden Circleoff of a porch 30 minutes ago. Right shoulder pain. EXAM: RIGHT SHOULDER - 2+ VIEW COMPARISON:  None.  FINDINGS: Glenohumeral joint is normal. There is a displaced fracture of the distal clavicle. Regional ribs appear normal. IMPRESSION: Displaced fracture of the distal clavicle. Electronically Signed   By: MNelson ChimesM.D.   On: 05/10/2016 18:59   I have personally reviewed and evaluated these images and lab results as part of my medical decision-making.   EKG Interpretation None      MDM   Final diagnoses:  Closed fracture of distal clavicle, right, initial encounter   Patient placed in sling. We'll give her orthopedic follow-up. Patient also given head injury precautions due to being on Eliquis though there was no known injury to the head.      DJulianne Rice MD 05/10/16 [phone]

## 2016-05-10 NOTE — ED Notes (Signed)
Pt fell of porch 30 minutes ago, complaining of right shoulder pain, denies LOC

## 2016-05-10 NOTE — Discharge Instructions (Signed)
Clavicle Fracture A clavicle fracture is a broken collarbone. The collarbone is the long bone that connects your shoulder to your rib cage. A broken collarbone may be treated with a sling, a wrap, or surgery. Treatment depends on whether the broken ends of the bone are out of place or not. HOME CARE  Put ice on the injured area:  Put ice in a plastic bag.  Place a towel between your skin and the bag.  Leave the ice on for 20 minutes, 2-3 times a day.  If you have a wrap or splint:  Wear it all the time, and remove it only to take a bath or shower.  When you bathe or shower, keep your shoulder in the same place as when the sling or wrap is on.  Do not lift your arm.  If you have a wrap:  Another person must tighten it every day.  It should be tight enough to hold your shoulders back.  Make sure you have enough room to put your pointer finger between your body and the strap.  Loosen the wrap right away if you cannot feel your arm or your hands tingle.  Only take medicines as told by your doctor.  Avoid activities that make the injury or pain worse for 4-6 weeks after surgery.  Keep all follow-up appointments. GET HELP IF:  Your medicine is not making you feel less pain.  Your medicine is not making swelling better. GET HELP RIGHT AWAY IF:   Your cannot feel your arm.  Your arm is cold.  Your arm is a lighter color than normal. MAKE SURE YOU:   Understand these instructions.  Will watch your condition.  Will get help right away if you are not doing well or get worse.   This information is not intended to replace advice given to you by your health care provider. Make sure you discuss any questions you have with your health care provider.   Document Released: 04/08/2008 Document Revised: 10/26/2013 Document Reviewed: 09/13/2013 Elsevier Interactive Patient Education [employer].

## 2016-05-14 ENCOUNTER — Encounter: Payer: Self-pay | Admitting: Orthopaedic Surgery

## 2016-05-14 ENCOUNTER — Ambulatory Visit (INDEPENDENT_AMBULATORY_CARE_PROVIDER_SITE_OTHER): Payer: Medicare Other | Admitting: Orthopaedic Surgery

## 2016-05-14 VITALS — BP 122/79 | HR 90 | Temp 98.1°F | Ht 71.0 in | Wt 152.0 lb

## 2016-05-14 DIAGNOSIS — S42001A Fracture of unspecified part of right clavicle, initial encounter for closed fracture: Secondary | ICD-10-CM

## 2016-05-14 MED ORDER — HYDROCODONE-ACETAMINOPHEN 7.5-325 MG PO TABS: 1.0000 | ORAL_TABLET | ORAL | Status: DC | PRN

## 2016-05-14 NOTE — Progress Notes (Signed)
Subjective:  I broke my collar bone    Patient ID: Patrick Lee, male    DOB: 11-06-1960, 55 y.o.   MRN: [id number]  HPI He fell and hurt his right shoulder at home on 05-10-16.  He went to the ER.  X-rays show displaced distal clavicle fracture.  I have reviewed the x-rays, the report and the ER notes.  He is on Eliquis for history of blood clots from the legs.  He continues on this. He has not had any major bleed or swelling of the right shoulder or marked ecchymosis from the injury.  He is in a sling.  He was given pain medicine but is out of the medicine.  He has no other injury.   Review of Systems  HENT: Negative for congestion.   Respiratory: Negative for cough and shortness of breath.   Endocrine: Positive for cold intolerance.  Musculoskeletal: Positive for arthralgias.  Allergic/Immunologic: Positive for environmental allergies.   Past Medical History  Diagnosis Date  . Lateral epicondylitis   . Chest pain, unspecified   . Abdominal pain, other specified site   . Obstruction of bile duct   . Tobacco use disorder   . Anxiety   . Shortness of breath     uses oxygen at night- 2 liters   . Myocardial infarction [facility]) 2007    during [place]- 2007  . Complication of anesthesia     pt. reports that he had MI while under anesth. for Whipple procedure  . Pancreatic cancer ([place])      status post Whipple procedure.  . Lung cancer ([place])   . Macrocytic anemia 10/10/2015    Past Surgical History  Procedure Laterality Date  . Whipple procedure  2007  . Percutaneous extraction of gallstones      [facility]  . Knee arthroscopy      R [facility]- at [facility]  . Cholecystectomy      open  . Video bronchoscopy  04/08/2012    Procedure: VIDEO BRONCHOSCOPY;  Surgeon: Gaye Pollack, MD;  Location: [facility] OR;  Service: Thoracic;  Laterality: N/A;  . Partial right lung removal    . Colonoscopy  May 2014    Dr. Anthony Sar: normal   . Esophagogastroduodenoscopy  Dec  2015    [facility]. normal esophagus, patent afferent and efferent libs, no abnormalities in jejunum.   . Bone marrow biopsy Left 05/25/15  . Bone marrow aspiration Left 05/25/15    Current Outpatient Prescriptions on File Prior to Visit  Medication Sig Dispense Refill  . apixaban (ELIQUIS) 2.5 MG TABS tablet Take 1 tablet (2.5 mg total) by mouth 2 (two) times daily. (Patient taking differently: Take 2.5 mg by mouth daily. ) 60 tablet 3  . furosemide (LASIX) 40 MG tablet TAKE ONE TABLET BY MOUTH ONCE DAILY 30 tablet 0  . ondansetron (ZOFRAN) 4 MG tablet Take 1 tablet (4 mg total) by mouth 4 (four) times daily -  with meals and at bedtime. 120 tablet 3  . spironolactone (ALDACTONE) 50 MG tablet Take 2 tablets (100 mg total) by mouth daily. (Patient taking differently: Take 50 mg by mouth daily. 14m daily) 100 tablet 3   No current facility-administered medications on file prior to visit.    Social History   Social History  . Marital Status: Divorced    Spouse Name: N/A  . Number of Children: 4  . Years of Education: N/A   Occupational History  . UNEMPLOYED  Social History Main Topics  . Smoking status: Former Smoker -- 1.00 packs/day for 28 years    Types: Cigarettes    Quit date: 01/02/2012  . Smokeless tobacco: Never Used  . Alcohol Use: No     Comment: last drink was 6 months ago per patient.  . Drug Use: No     Comment: History of illicit substance abuse (marijuna)  . Sexual Activity: Not on file   Other Topics Concern  . Not on file   Social History Narrative    Family History  Problem Relation Age of Onset  . Stroke Father 58    Deceased  . Anesthesia problems Neg Hx   . Colon cancer Neg Hx   . Cirrhosis Neg Hx     BP 122/79 mmHg  Pulse 90  Temp(Src) 98.1 F (36.7 C)  Ht '5\' 11"'  (1.803 m)  Wt 152 lb (68.947 kg)  BMI 21.21 kg/m2     Objective:   Physical Exam  Constitutional: He is oriented to person, place, and time. He appears well-developed and  well-nourished.  HENT:  Head: Normocephalic and atraumatic.  Eyes: Conjunctivae and EOM are normal. Pupils are equal, round, and reactive to light.  Neck: Normal range of motion. Neck supple.  Cardiovascular: Normal rate, regular rhythm and intact distal pulses.   Pulmonary/Chest: Effort normal.  Abdominal: Soft.  Musculoskeletal: He exhibits tenderness (Right distal clavicle and shoulder are very tender. He has some swelling but no ecchymosis.  ROM of the right shoudler markedly decreased and painful.  Neck normal ROM.  Left shoudler negative.).  Neurological: He is alert and oriented to person, place, and time. He has normal reflexes. No cranial nerve deficit. He exhibits normal muscle tone. Coordination normal.  Skin: Skin is warm and dry.  Psychiatric: He has a normal mood and affect. His behavior is normal. Judgment and thought content normal.          Assessment & Plan:   Encounter Diagnosis  Name Primary?  . Fracture of right clavicle, closed, initial encounter Yes   A new sling was given and fitted.  He is to sleep semi-erect.  I have given new Rx for pain.  Use ice as needed.  Return in one week with x-rays then of the right shoulder.  Call if any problem.  Precautions discussed.  Electronically Signed Sanjuana Kava, MD 7/11/20174:47 PM

## 2016-05-21 ENCOUNTER — Other Ambulatory Visit: Payer: Medicare Other

## 2016-05-21 ENCOUNTER — Ambulatory Visit: Payer: Medicare Other | Admitting: Orthopaedic Surgery

## 2016-05-22 ENCOUNTER — Ambulatory Visit: Payer: Medicare Other | Admitting: Orthopaedic Surgery

## 2016-05-22 ENCOUNTER — Ambulatory Visit (HOSPITAL_COMMUNITY)
Admission: RE | Admit: 2016-05-22 | Discharge: 2016-05-22 | Disposition: A | Discharge status: Home / Self Care | Payer: Medicare Other | Source: Ambulatory Visit | Attending: Orthopaedic Surgery | Admitting: Orthopaedic Surgery

## 2016-05-22 ENCOUNTER — Encounter: Payer: Self-pay | Admitting: Orthopaedic Surgery

## 2016-05-22 VITALS — BP 119/80 | HR 92 | Temp 97.7°F | Ht 71.0 in | Wt 146.2 lb

## 2016-05-22 DIAGNOSIS — S42001D Fracture of unspecified part of right clavicle, subsequent encounter for fracture with routine healing: Secondary | ICD-10-CM

## 2016-05-22 DIAGNOSIS — S42031D Displaced fracture of lateral end of right clavicle, subsequent encounter for fracture with routine healing: Secondary | ICD-10-CM | POA: Diagnosis not present

## 2016-05-22 DIAGNOSIS — X58XXXD Exposure to other specified factors, subsequent encounter: Secondary | ICD-10-CM | POA: Insufficient documentation

## 2016-05-22 IMAGING — CR DG CHEST 2V
2 series · 2 of 2 positions shown · non-contrast
Comparison: 04/14/2015

CLINICAL DATA: Followup pneumonia

EXAM:
CHEST  2 VIEW

[chest pa]
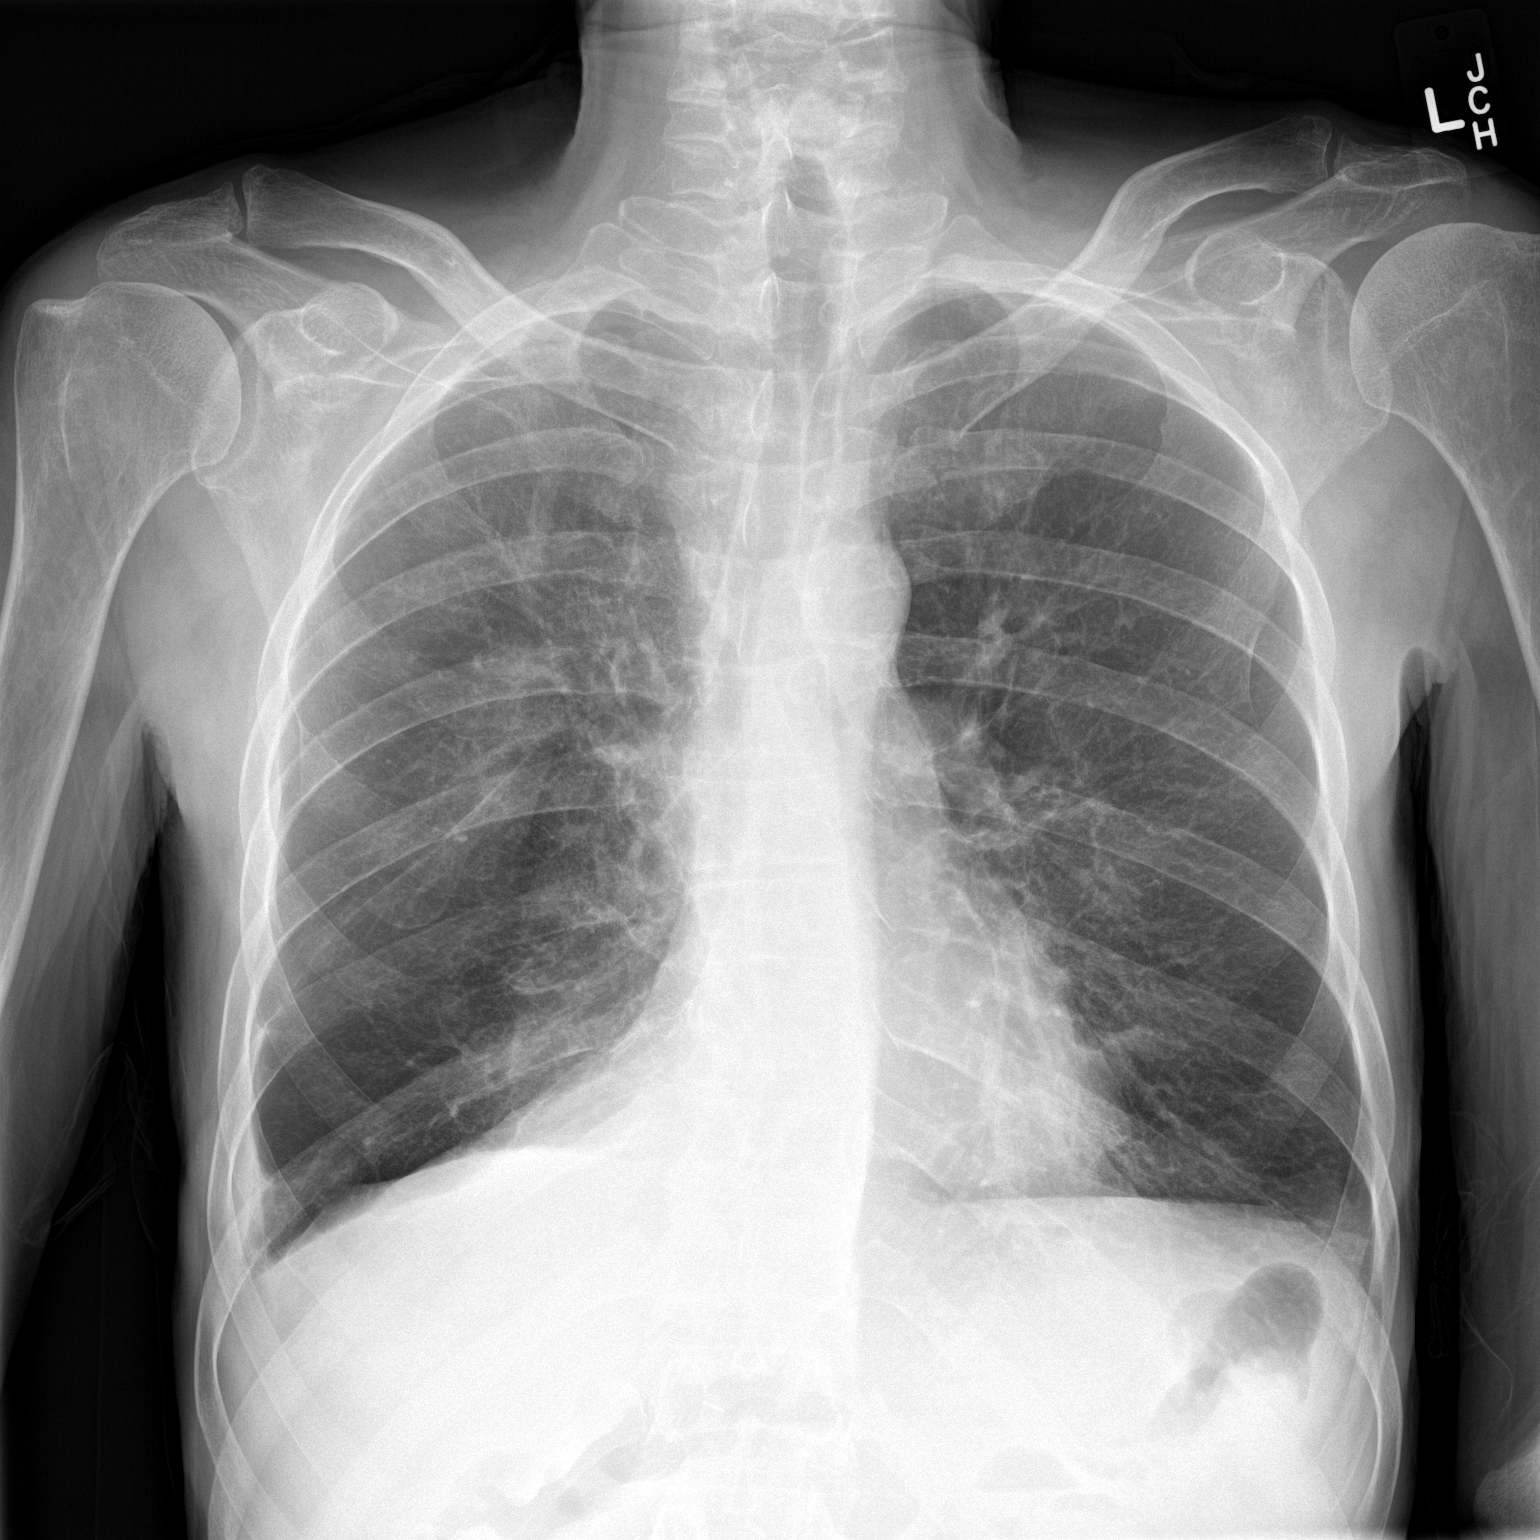

[chest lat]
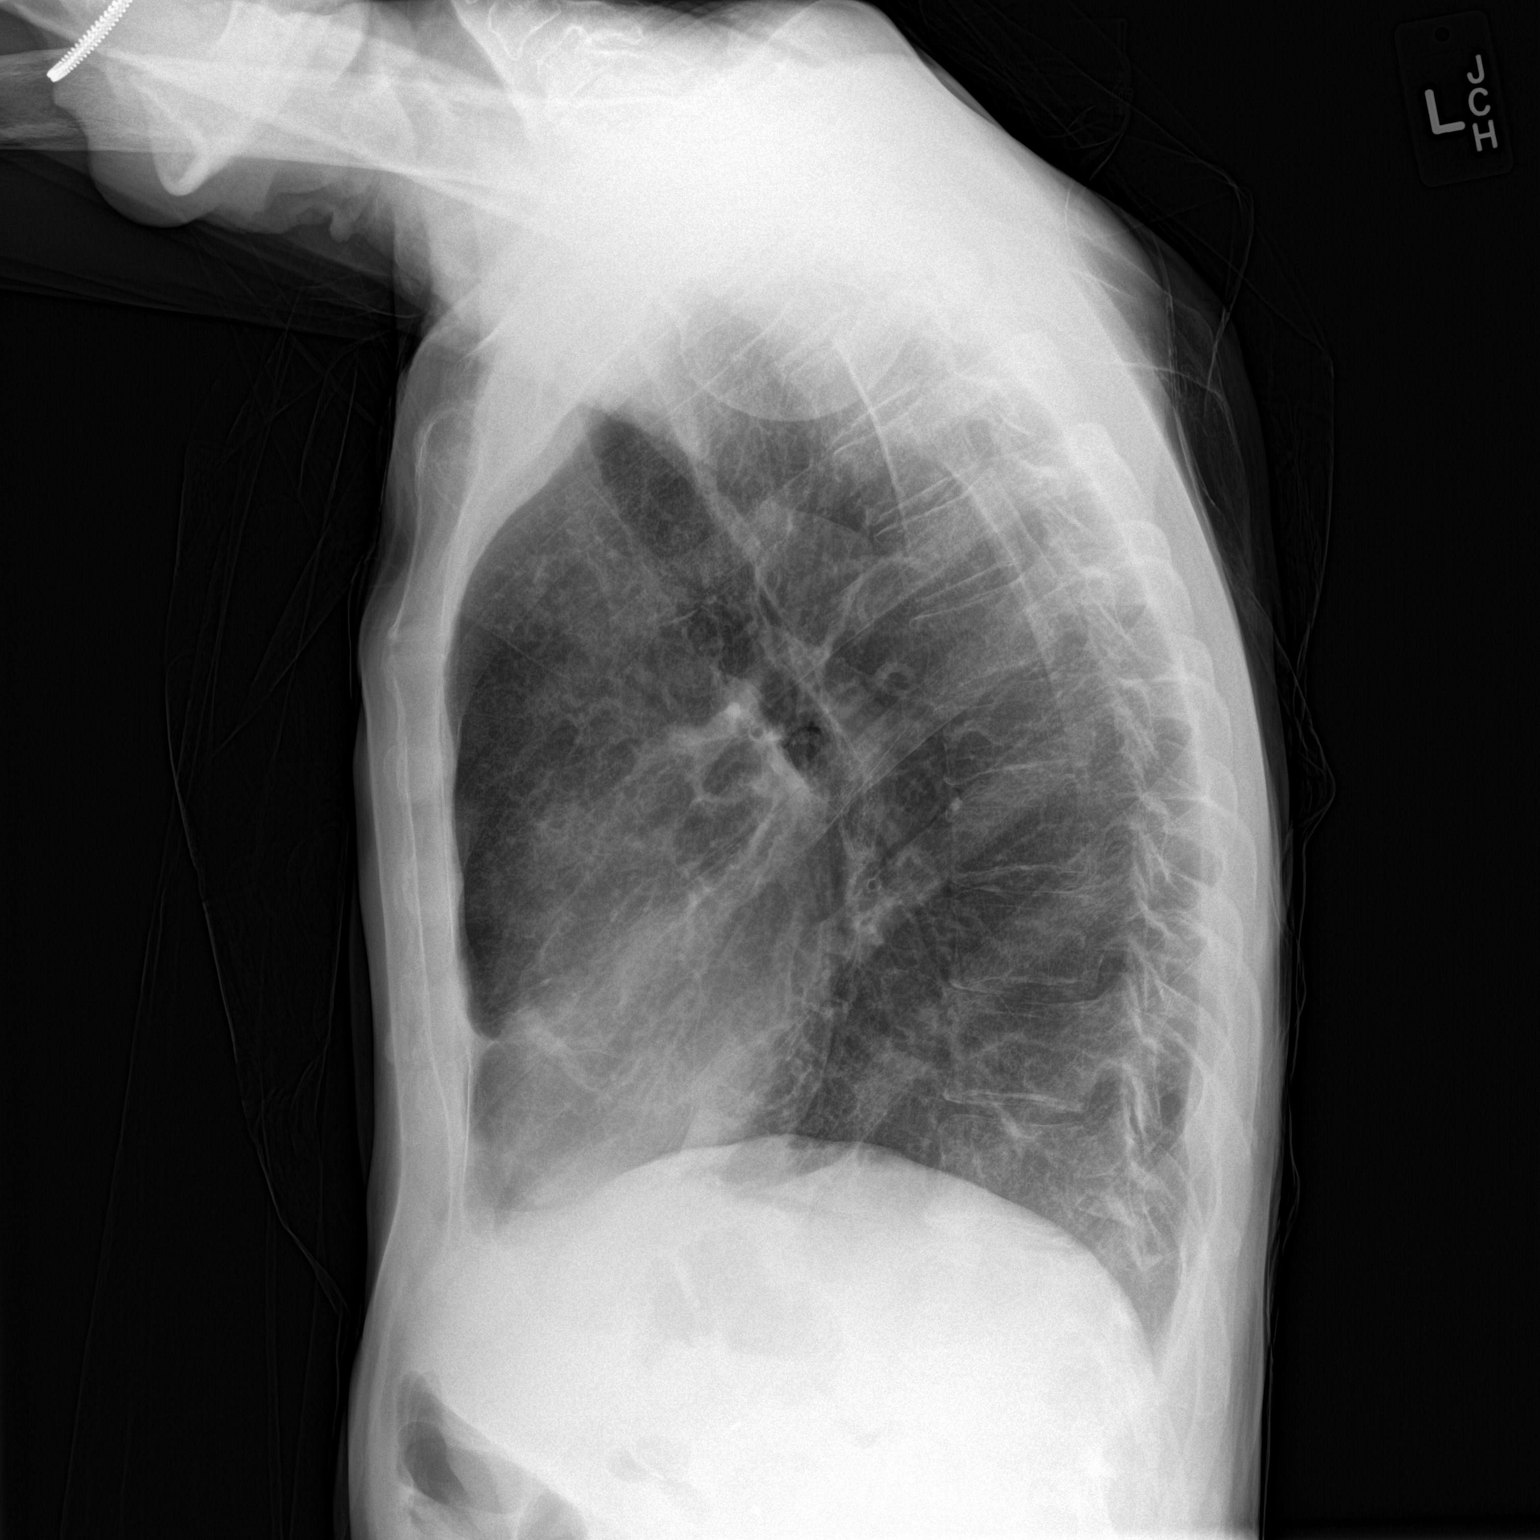

[2 of 2 positions shown; findings below may reference images not displayed]

FINDINGS: Cardiomediastinal silhouette is stable. No acute infiltrate or
pleural effusion. No pulmonary edema. Mild right basilar atelectasis
or scarring. Bony thorax is stable. Stable probable chronic mild
interstitial prominence.
IMPRESSION: No active cardiopulmonary disease. Probable chronic mild
interstitial prominence. Mild right basilar atelectasis or scarring.

## 2016-05-22 NOTE — Progress Notes (Signed)
CC:  My shoulder is still sore  He has fracture with displacement of distal right clavicle. He has been using the sling.  He has less pain.  He has no paresthesias.  X-rays were done and reported separately.  NV is intact.  Sling is OK.  Encounter Diagnosis  Name Primary?  . Fracture of right clavicle, with routine healing, subsequent encounter Yes    Continue the sling.  X-rays in three weeks.  Precautions discussed.  Call if any problem.  Electronically Signed Sanjuana Kava, MD 7/19/201711:05 PM

## 2016-05-25 IMAGING — CR DG CHEST 1V
1 series · 1 of 1 positions shown · non-contrast
Comparison: 04/19/2015 and 03/16/2015

CLINICAL DATA: Cough.  History of pancreatic cancer.

EXAM:
CHEST  1 VIEW

[AP]
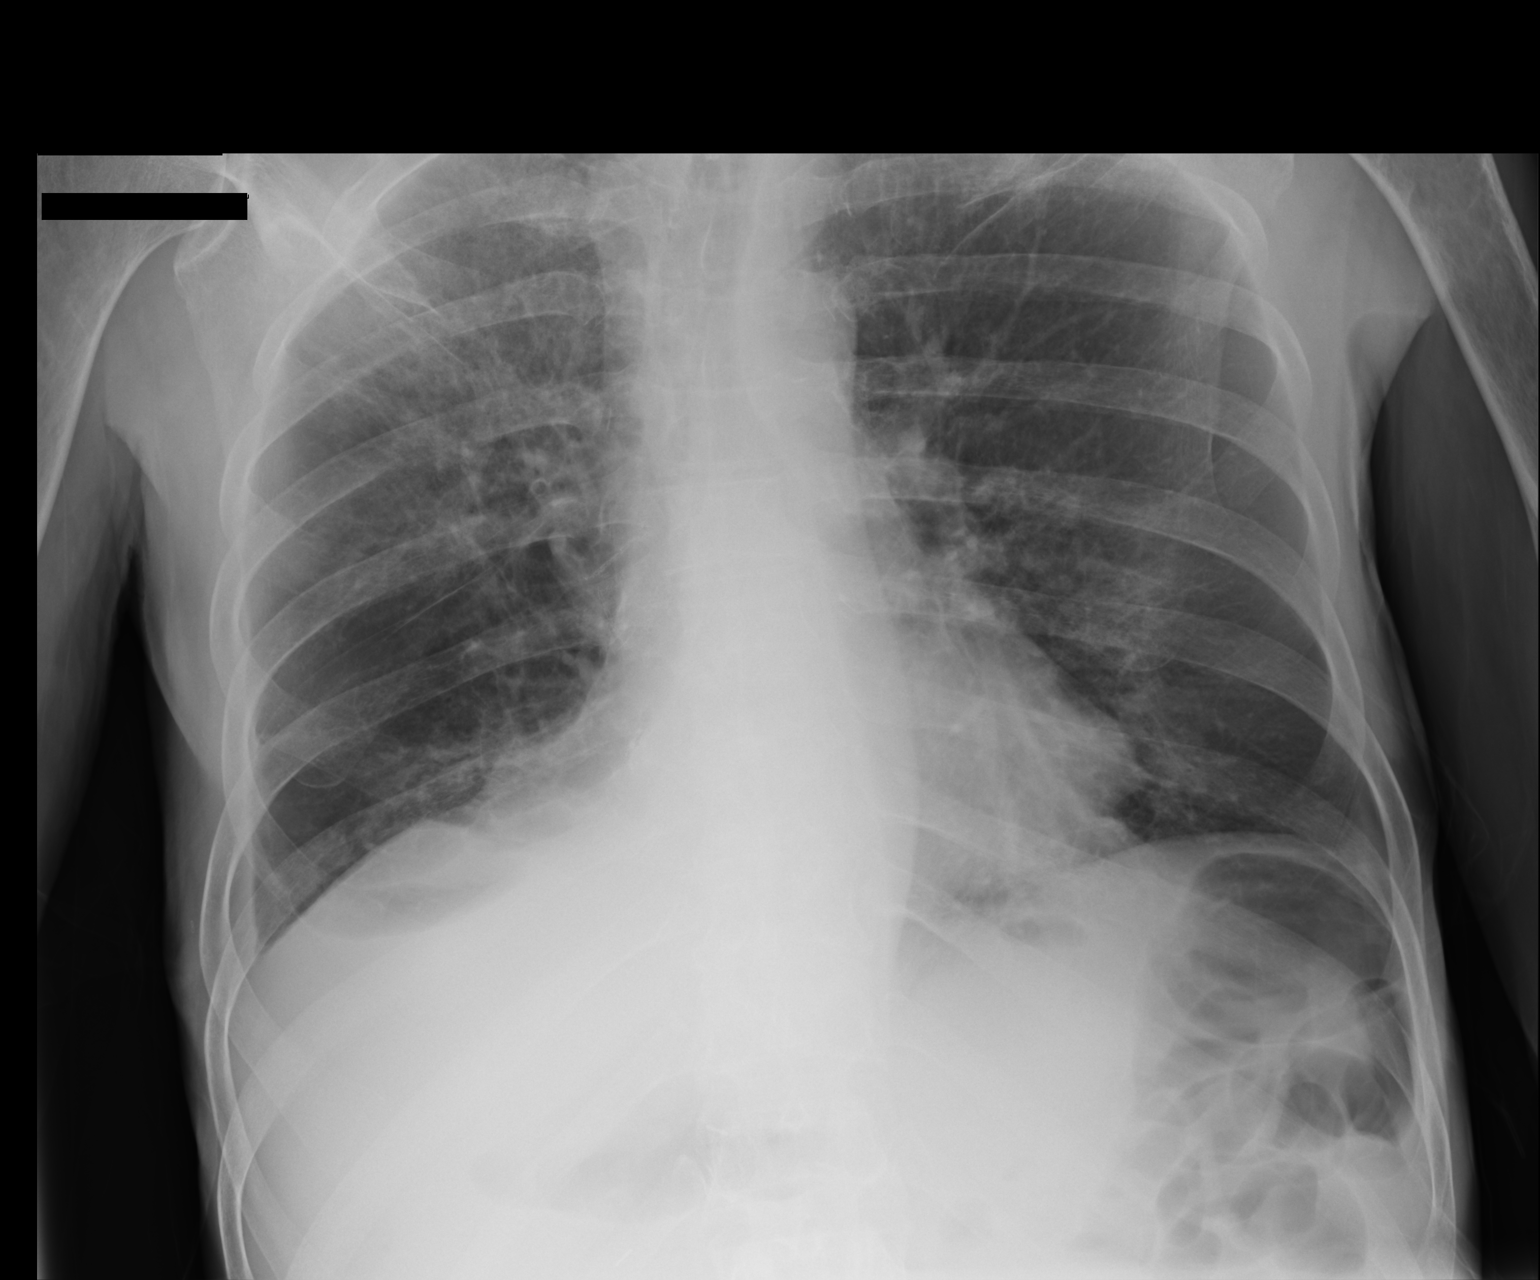

[1 of 1 positions shown; findings below may reference images not displayed]

FINDINGS: Prominent interstitial densities in the right upper lung compared to
the prior examinations. Few prominent densities in the left
perihilar region. Heart size is normal. Trachea is midline.
IMPRESSION: Prominent interstitial densities in the right upper lung are
concerning for a subtle infectious or inflammatory process.
Recommend follow up to ensure resolution.

## 2016-06-12 ENCOUNTER — Encounter: Payer: Self-pay | Admitting: Orthopaedic Surgery

## 2016-06-12 ENCOUNTER — Ambulatory Visit: Payer: Medicare Other | Admitting: Orthopaedic Surgery

## 2016-06-12 ENCOUNTER — Ambulatory Visit (INDEPENDENT_AMBULATORY_CARE_PROVIDER_SITE_OTHER): Payer: Medicare Other

## 2016-06-12 VITALS — BP 125/72 | HR 72 | Ht 71.0 in | Wt 142.0 lb

## 2016-06-12 DIAGNOSIS — S42001D Fracture of unspecified part of right clavicle, subsequent encounter for fracture with routine healing: Secondary | ICD-10-CM

## 2016-06-12 MED ORDER — HYDROCODONE-ACETAMINOPHEN 7.5-325 MG PO TABS
1.0000 | ORAL_TABLET | ORAL | 0 refills | Status: DC | PRN
Start: 2016-06-12 — End: 2016-07-09

## 2016-06-12 NOTE — Progress Notes (Signed)
CC:  My shoulder is still sore  He has some pain of the right distal clavicle area.  He has no redness, no numbness.  He is wearing his sling.  He has no new trauma.  NV intact. ROM limited of the right shoulder secondary to pain.  X-rays were done, reported separately.  Encounter Diagnosis  Name Primary?  . Fracture of right clavicle, with routine healing, subsequent encounter Yes   He can gradually come out of the sling.  New Rx for pain medicine given.  Return in three weeks.  X-ray on return.  Call if any problem.  Precautions given.  Electronically Signed Sanjuana Kava, MD 8/9/20172:49 PM

## 2016-07-03 ENCOUNTER — Encounter: Payer: Medicare Other | Admitting: Orthopaedic Surgery

## 2016-07-03 ENCOUNTER — Ambulatory Visit: Payer: Medicare Other

## 2016-07-09 ENCOUNTER — Ambulatory Visit (INDEPENDENT_AMBULATORY_CARE_PROVIDER_SITE_OTHER): Payer: Medicare Other

## 2016-07-09 ENCOUNTER — Encounter: Payer: Self-pay | Admitting: Orthopaedic Surgery

## 2016-07-09 ENCOUNTER — Ambulatory Visit (INDEPENDENT_AMBULATORY_CARE_PROVIDER_SITE_OTHER): Payer: Medicare Other | Admitting: Orthopaedic Surgery

## 2016-07-09 VITALS — BP 121/76 | HR 79 | Temp 98.1°F | Ht 71.0 in | Wt 151.6 lb

## 2016-07-09 DIAGNOSIS — S42001D Fracture of unspecified part of right clavicle, subsequent encounter for fracture with routine healing: Secondary | ICD-10-CM | POA: Diagnosis not present

## 2016-07-09 MED ORDER — NAPROXEN 500 MG PO TABS: 500.0000 mg | ORAL_TABLET | Freq: Two times a day (BID) | ORAL | 5 refills | Status: AC

## 2016-07-09 MED ORDER — HYDROCODONE-ACETAMINOPHEN 7.5-325 MG PO TABS: 1.0000 | ORAL_TABLET | ORAL | 0 refills | Status: DC | PRN

## 2016-07-09 NOTE — Progress Notes (Signed)
CC:  My shoulder is still sore  He has some pain still over the right clavicle fracture area.  He has no redness or swelling.  NV intact. ROM good but tender.  X-rays of clavicle done, reported separately.  Encounter Diagnosis  Name Primary?  . Clavicle fracture, right, with routine healing, subsequent encounter Yes    He has some generalized arthritic pain.  I will begin Naprosyn.  I will renew pain medicine.  Return in three weeks.  Call if any problem.  Precautions discussed. He has stopped his blood thinner.  If he resumes it, he will need to stop the Naprosyn.  Electronically Signed Sanjuana Kava, MD 9/5/20172:55 PM

## 2016-07-30 ENCOUNTER — Ambulatory Visit: Payer: Medicare Other | Admitting: Orthopaedic Surgery

## 2016-08-12 ENCOUNTER — Other Ambulatory Visit (HOSPITAL_COMMUNITY): Payer: Self-pay | Admitting: Interventional Radiology

## 2016-08-12 DIAGNOSIS — E43 Unspecified severe protein-calorie malnutrition: Secondary | ICD-10-CM

## 2016-08-12 DIAGNOSIS — R634 Abnormal weight loss: Secondary | ICD-10-CM

## 2016-08-12 DIAGNOSIS — R188 Other ascites: Principal | ICD-10-CM

## 2016-08-12 DIAGNOSIS — K7460 Unspecified cirrhosis of liver: Secondary | ICD-10-CM

## 2016-08-12 DIAGNOSIS — R1011 Right upper quadrant pain: Secondary | ICD-10-CM

## 2016-08-13 ENCOUNTER — Ambulatory Visit (INDEPENDENT_AMBULATORY_CARE_PROVIDER_SITE_OTHER): Payer: Medicare Other | Admitting: Orthopaedic Surgery

## 2016-08-13 ENCOUNTER — Ambulatory Visit (INDEPENDENT_AMBULATORY_CARE_PROVIDER_SITE_OTHER): Payer: Medicare Other

## 2016-08-13 DIAGNOSIS — S42001D Fracture of unspecified part of right clavicle, subsequent encounter for fracture with routine healing: Secondary | ICD-10-CM

## 2016-08-13 MED ORDER — HYDROCODONE-ACETAMINOPHEN 7.5-325 MG PO TABS: 1.0000 | ORAL_TABLET | Freq: Four times a day (QID) | ORAL | 0 refills | Status: DC | PRN

## 2016-08-14 NOTE — Progress Notes (Signed)
Patient Patrick Lee, male DOB:[birth year], 55 y.o. ZGY:[id number]  Chief Complaint  Patient presents with  . Follow-up    right clavicle    HPI  Patrick Lee is a 55 y.o. male who has a healing fracture of the right distal clavicle from injury 05-10-16.  He has some pain of the shoulder relieved by pain medicine.  He also has multiple joint arthralgias.  I have told him to address this with his family doctor.  I had given him a course of prednisone dose pack which did not help the joint pains at all.  He has no new trauma.  He has pain with overhead use of the clavicle.  I have told him previously the possibility of surgery of the clavicle.  He has not wanted to consider this but understands that could be a treatment option.  He has some healing present.  He has no redness, no paresthesias. HPI  There is no height or weight on file to calculate BMI.  ROS  Review of Systems  HENT: Negative for congestion.   Respiratory: Negative for cough and shortness of breath.   Endocrine: Positive for cold intolerance.  Musculoskeletal: Positive for arthralgias.  Allergic/Immunologic: Positive for environmental allergies.    Past Medical History:  Diagnosis Date  . Abdominal pain, other specified site   . Anxiety   . Chest pain, unspecified   . Complication of anesthesia    pt. reports that he had MI while under anesth. for Whipple procedure  . Lateral epicondylitis   . Lung cancer ([place])   . Macrocytic anemia 10/10/2015  . Myocardial infarction 2007   during [place]- 2007  . Obstruction of bile duct   . Pancreatic cancer ([place])     status post Whipple procedure.  . Shortness of breath    uses oxygen at night- 2 liters   . Tobacco use disorder     Past Surgical History:  Procedure Laterality Date  . BONE MARROW ASPIRATION Left 05/25/15  . BONE MARROW BIOPSY Left 05/25/15  . CHOLECYSTECTOMY     open  . COLONOSCOPY  May 2014   Dr. Anthony Sar: normal   .  ESOPHAGOGASTRODUODENOSCOPY  Dec 2015   [facility]. normal esophagus, patent afferent and efferent libs, no abnormalities in jejunum.   Marland Kitchen KNEE ARTHROSCOPY     R knee- at [facility]  . partial right lung removal    . Percutaneous extraction of gallstones     2010- [place]  04/08/2012   Procedure: VIDEO BRONCHOSCOPY;  Surgeon: Gaye Pollack, MD;  Location: [facility] OR;  Service: Thoracic;  Laterality: N/A;  . WHIPPLE PROCEDURE  2007    Family History  Problem Relation Age of Onset  . Stroke Father 31    Deceased  . Anesthesia problems Neg Hx   . Colon cancer Neg Hx   . Cirrhosis Neg Hx     Social History Social History  Substance Use Topics  . Smoking status: Former Smoker    Packs/day: 1.00    Years: 28.00    Types: Cigarettes    Quit date: 01/02/2012  . Smokeless tobacco: Never Used  . Alcohol use No     Comment: last drink was 6 months ago per patient.    No Known Allergies  Current Outpatient Prescriptions  Medication Sig Dispense Refill  . furosemide (LASIX) 40 MG tablet TAKE ONE TABLET BY MOUTH ONCE DAILY 30 tablet 0  . HYDROcodone-acetaminophen (NORCO) 7.5-325  MG tablet Take 1 tablet by mouth every 6 (six) hours as needed for moderate pain (Must last 30 days.Do not drive or operate machinery while taking this medicine.). 120 tablet 0  . naproxen (NAPROSYN) 500 MG tablet Take 1 tablet (500 mg total) by mouth 2 (two) times daily with a meal. 60 tablet 5  . ondansetron (ZOFRAN) 4 MG tablet Take 1 tablet (4 mg total) by mouth 4 (four) times daily -  with meals and at bedtime. 120 tablet 3  . spironolactone (ALDACTONE) 50 MG tablet Take 2 tablets (100 mg total) by mouth daily. (Patient taking differently: Take 50 mg by mouth daily. 35m daily) 100 tablet 3   No current facility-administered medications for this visit.      Physical Exam  There were no vitals taken for this visit.  Constitutional: overall normal hygiene, normal nutrition, well  developed, normal grooming, normal body habitus. Assistive device:none  Musculoskeletal: gait and station Limp none, muscle tone and strength are normal, no tremors or atrophy is present.  .  Neurological: coordination overall normal.  Deep tendon reflex/nerve stretch intact.  Sensation normal.  Cranial nerves II-XII intact.   Skin:   Normal overall no scars, lesions, ulcers or rashes. No psoriasis.  Psychiatric: Alert and oriented x 3.  Recent memory intact, remote memory unclear.  Normal mood and affect. Well groomed.  Good eye contact.  Cardiovascular: overall no swelling, no varicosities, no edema bilaterally, normal temperatures of the legs and arms, no clubbing, cyanosis and good capillary refill.  Lymphatic: palpation is normal.  He has full ROM of the right shoulder but more pain with full overhead use.  He has prominence of the distal right clavicle that is tender.  He has no redness or swelling.  NV is intact.   The patient has been educated about the nature of the problem(s) and counseled on treatment options.  The patient appeared to understand what I have discussed and is in agreement with it.  Encounter Diagnosis  Name Primary?  . Closed displaced fracture of right clavicle with routine healing, unspecified part of clavicle, subsequent encounter Yes   X-rays were done and reported separately.  PLAN Call if any problems.  Precautions discussed.  Continue current medications.   Return to clinic 1 month   X-rays on return.  Electronically Signed WSanjuana Kava MD 10/11/20177:54 AM

## 2016-08-28 ENCOUNTER — Other Ambulatory Visit: Payer: Medicare Other

## 2016-09-03 IMAGING — US US ABDOMEN LIMITED
1 series · 3 of 3 positions shown · non-contrast
Comparison: 07/28/2015

CLINICAL DATA: Ascites

EXAM:
LIMITED ABDOMEN ULTRASOUND FOR ASCITES
TECHNIQUE: Limited ultrasound survey for ascites was performed in all four
abdominal quadrants.

[Series 1: us abdomen limited · 0.16mm/px · 3 of 3 slices shown]
[im 1/3]
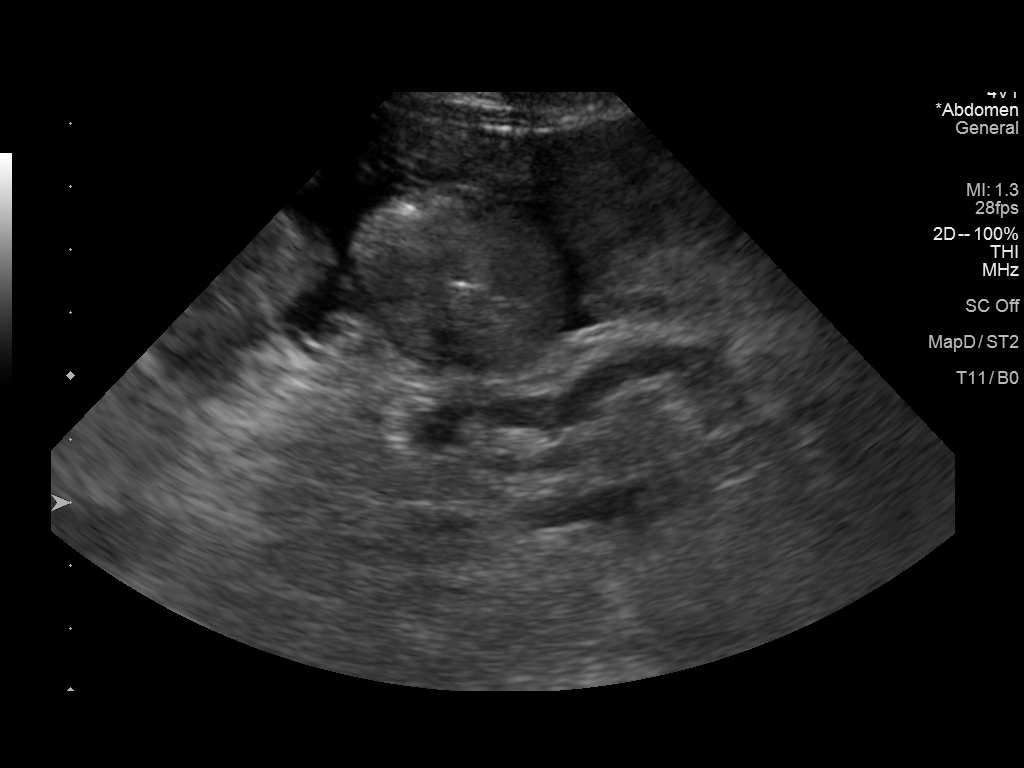
[im 2/3]
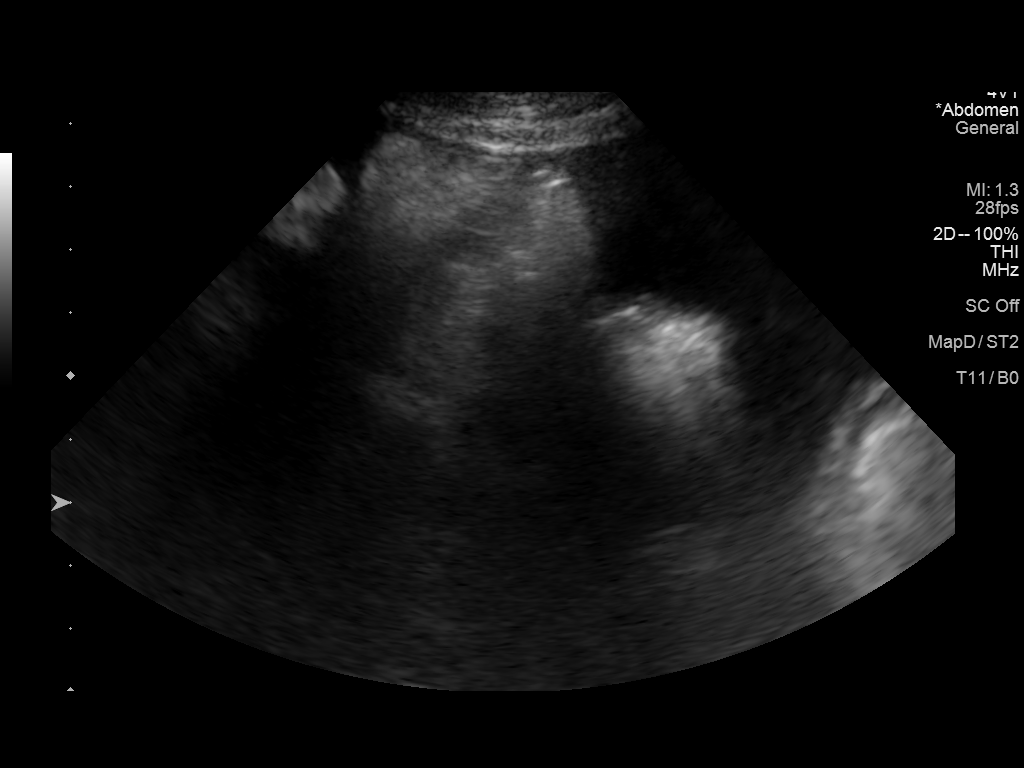
[im 3/3]
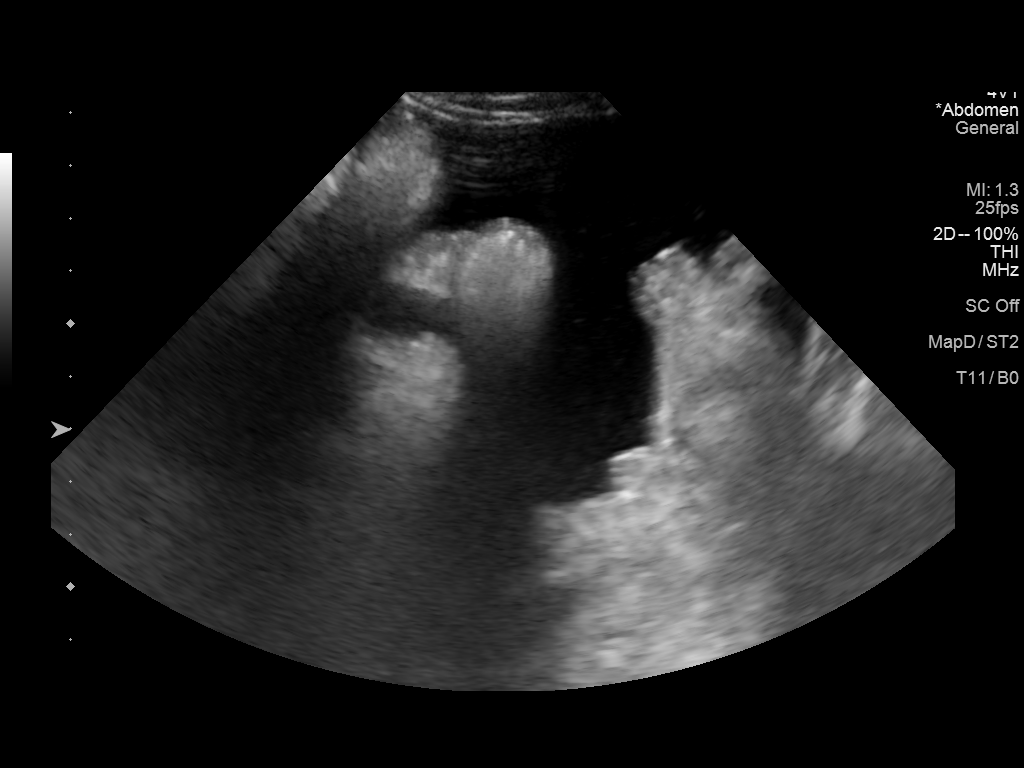

[3 of 3 positions shown; findings below may reference images not displayed]

FINDINGS: Only small amounts of ascites are identified throughout the abdomen
greatest in LEFT lower quadrant.

Volume of ascites present is insufficient for paracentesis.
IMPRESSION: Small volume ascites, insufficient for paracentesis.

## 2016-09-10 ENCOUNTER — Telehealth: Payer: Self-pay | Admitting: Interventional Radiology

## 2016-09-10 ENCOUNTER — Inpatient Hospital Stay: Admission: RE | Admit: 2016-09-10 | Payer: Medicare Other | Source: Ambulatory Visit

## 2016-09-10 NOTE — Progress Notes (Signed)
Pt has no-showed for another scheduled IR clinic visit.  Per radiology review, it appears the pt has not undergone and US guided paracentesis at any of our institutions since 01/18/16.  Given lack of recurrent symptomatic ascites, it does NOT appear the pt is a candidate for TIPS.  Please refer back to the IR clinic [phone]) if he develops recurrent symptomatic ascites in the future.  Ronny Bacon, MD Pager #: [phone]

## 2016-09-11 ENCOUNTER — Ambulatory Visit (INDEPENDENT_AMBULATORY_CARE_PROVIDER_SITE_OTHER): Payer: Medicare Other | Admitting: Orthopaedic Surgery

## 2016-09-11 ENCOUNTER — Encounter: Payer: Self-pay | Admitting: Orthopaedic Surgery

## 2016-09-11 ENCOUNTER — Ambulatory Visit (INDEPENDENT_AMBULATORY_CARE_PROVIDER_SITE_OTHER): Payer: Medicare Other

## 2016-09-11 VITALS — BP 138/83 | HR 72 | Temp 98.1°F | Ht 71.0 in | Wt 160.0 lb

## 2016-09-11 DIAGNOSIS — S42001D Fracture of unspecified part of right clavicle, subsequent encounter for fracture with routine healing: Secondary | ICD-10-CM | POA: Diagnosis not present

## 2016-09-11 DIAGNOSIS — F17210 Nicotine dependence, cigarettes, uncomplicated: Secondary | ICD-10-CM

## 2016-09-11 MED ORDER — HYDROCODONE-ACETAMINOPHEN 7.5-325 MG PO TABS: 1.0000 | ORAL_TABLET | Freq: Four times a day (QID) | ORAL | 0 refills | Status: DC | PRN

## 2016-09-11 NOTE — Progress Notes (Signed)
CC:  My shoulder still hurts  He still has some pain of the right distal clavicle.  He has no new trauma.  He has no numbness.  He has stability of the right clavicle now.  NV intact. ROM of the right shoulder is good.  Encounter Diagnoses  Name Primary?  . Closed displaced fracture of right clavicle with routine healing, unspecified part of clavicle, subsequent encounter Yes  . Cigarette nicotine dependence without complication    He continues to smoke but has cut back and will try to continue this.  Return in one month.  X-rays on return.  Call if any problem.  Rx refilled.  Electronically Signed Sanjuana Kava, MD 11/8/20171:52 PM

## 2016-09-20 IMAGING — US US PARACENTESIS
1 series · 3 of 3 positions shown · non-contrast
Comparison: None.

CLINICAL DATA: Ascites

EXAM:
ULTRASOUND GUIDED  PARACENTESIS

[Series 1: us paracentesis · 0.20mm/px · 3 of 3 slices shown]
[im 1/3]
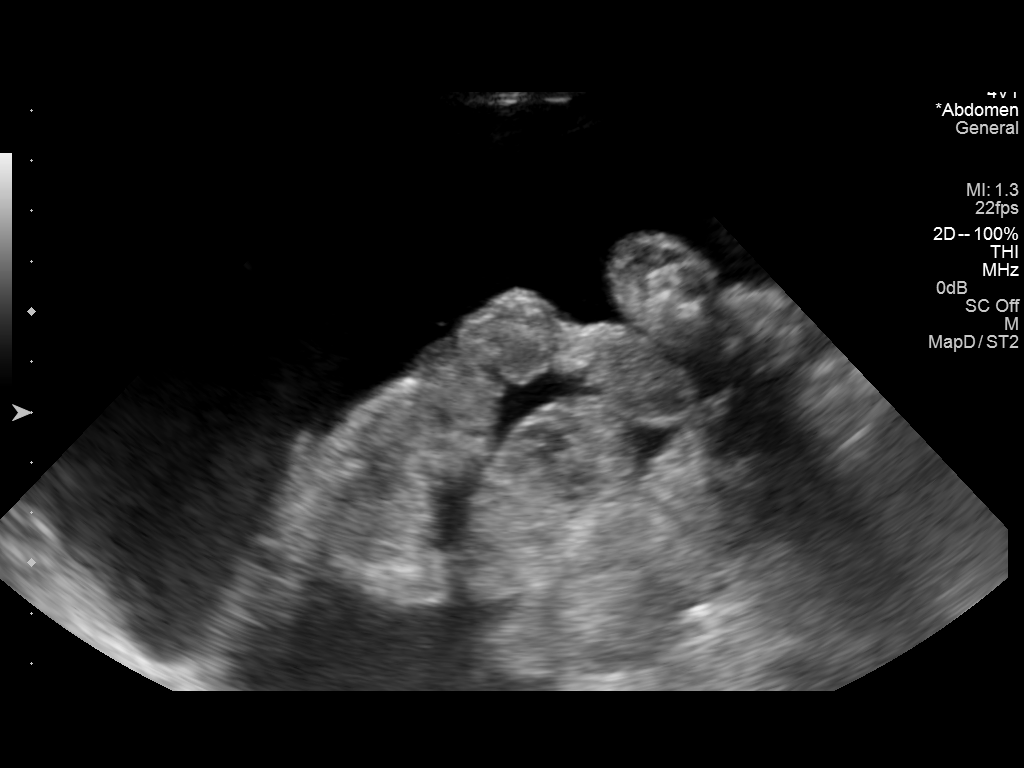
[im 2/3]
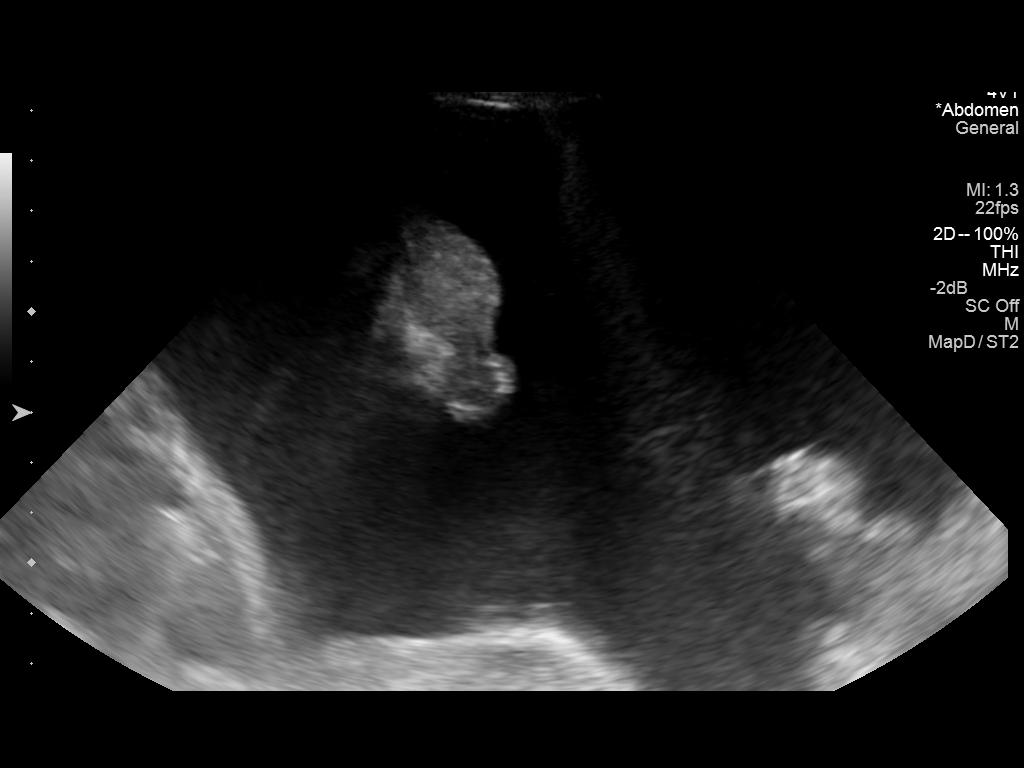
[im 3/3]
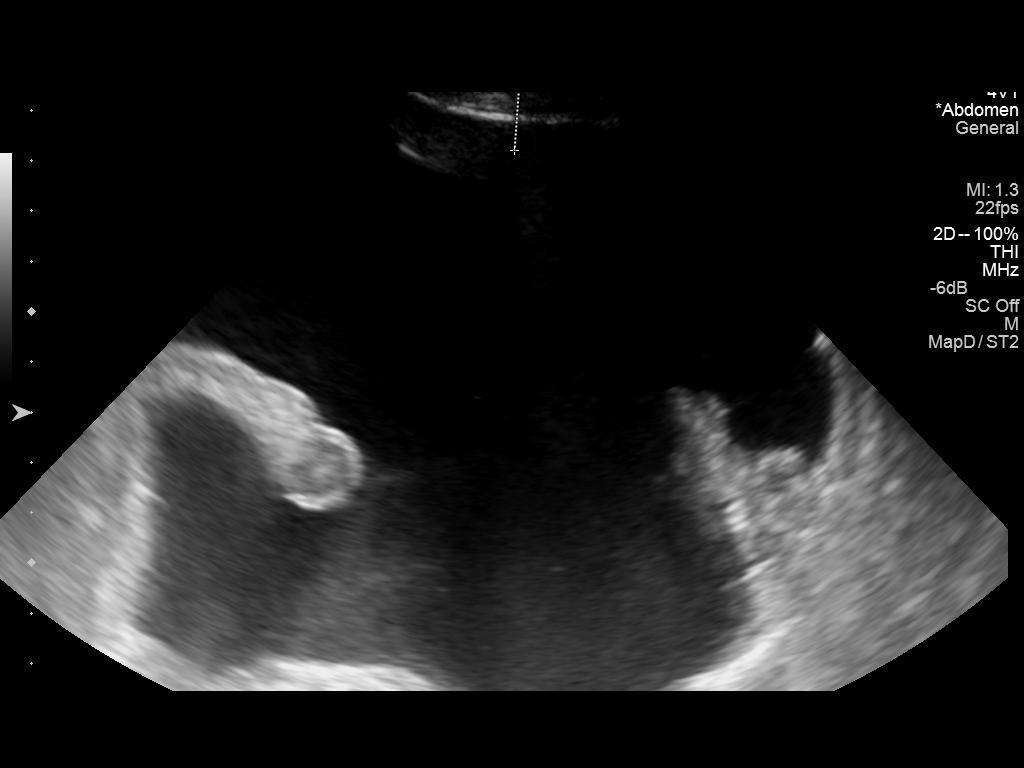

[3 of 3 positions shown; findings below may reference images not displayed]

PROCEDURE:
An ultrasound guided paracentesis was thoroughly discussed with the
patient and questions answered. The benefits, risks, alternatives
and complications were also discussed. The patient understands and
wishes to proceed with the procedure. Written consent was obtained.

Ultrasound was performed to localize and mark an adequate pocket of
fluid in the left lower quadrant of the abdomen. The area was then
prepped and draped in the normal sterile fashion. 1% Lidocaine was
used for local anesthesia. Under ultrasound guidance a 19 gauge Yueh
catheter was introduced. Paracentesis was performed. The catheter
was removed and a dressing applied.

COMPLICATIONS:
None.
FINDINGS: A total of approximately 5 L of clear yellow fluid was removed. A
fluid sample was not sent for laboratory analysis.
IMPRESSION: Successful ultrasound guided paracentesis yielding 5 L of ascites.

## 2016-09-30 ENCOUNTER — Ambulatory Visit (INDEPENDENT_AMBULATORY_CARE_PROVIDER_SITE_OTHER): Payer: Medicare Other | Admitting: Internal Medicine

## 2016-09-30 ENCOUNTER — Encounter (INDEPENDENT_AMBULATORY_CARE_PROVIDER_SITE_OTHER): Payer: Self-pay | Admitting: Internal Medicine

## 2016-10-03 NOTE — Progress Notes (Signed)
This encounter was created in error - please disregard.

## 2016-10-09 ENCOUNTER — Ambulatory Visit (INDEPENDENT_AMBULATORY_CARE_PROVIDER_SITE_OTHER): Payer: Medicare Other

## 2016-10-09 ENCOUNTER — Ambulatory Visit (INDEPENDENT_AMBULATORY_CARE_PROVIDER_SITE_OTHER): Payer: Self-pay | Admitting: Orthopaedic Surgery

## 2016-10-09 ENCOUNTER — Encounter: Payer: Self-pay | Admitting: Orthopaedic Surgery

## 2016-10-09 DIAGNOSIS — S42001D Fracture of unspecified part of right clavicle, subsequent encounter for fracture with routine healing: Secondary | ICD-10-CM | POA: Diagnosis not present

## 2016-10-09 MED ORDER — HYDROCODONE-ACETAMINOPHEN 7.5-325 MG PO TABS: 1.0000 | ORAL_TABLET | Freq: Four times a day (QID) | ORAL | 0 refills | Status: DC | PRN

## 2016-10-09 NOTE — Progress Notes (Signed)
CC:  My collar bone hurts at times  He has more pain of the right clavicle at night.  He has no new trauma.  He has no paresthesias.  NV intact. ROM is nearly full of right shoulder.  X-rays were done of the right clavicle, reported separately.  Encounter Diagnosis  Name Primary?  . Closed displaced fracture of right clavicle with routine healing, unspecified part of clavicle, subsequent encounter Yes   Return in two months.  X-ray on return.  Rx for pain renewed.  Electronically Signed Sanjuana Kava, MD 12/6/20172:01 PM

## 2016-11-13 ENCOUNTER — Telehealth: Payer: Self-pay | Admitting: Orthopaedic Surgery

## 2016-11-13 NOTE — Telephone Encounter (Signed)
Dr. Brooke Bonito patient requests a refill on Hydrocodone/Acetaminophen (Norco)  7.5-325 mgs.  Qty  120  Sig: Take 1 tablet by mouth every 6 (six) hours as needed for moderate pain (Must last 30 days.Do not drive or operate machinery while taking this medicine.).

## 2016-11-14 ENCOUNTER — Other Ambulatory Visit: Payer: Self-pay | Admitting: *Deleted

## 2016-11-14 MED ORDER — HYDROCODONE-ACETAMINOPHEN 7.5-325 MG PO TABS: 1.0000 | ORAL_TABLET | Freq: Four times a day (QID) | ORAL | 0 refills | Status: DC | PRN

## 2016-12-11 ENCOUNTER — Ambulatory Visit (INDEPENDENT_AMBULATORY_CARE_PROVIDER_SITE_OTHER): Payer: Medicare Other | Admitting: Orthopaedic Surgery

## 2016-12-11 ENCOUNTER — Ambulatory Visit (INDEPENDENT_AMBULATORY_CARE_PROVIDER_SITE_OTHER): Payer: Medicare Other

## 2016-12-11 ENCOUNTER — Encounter: Payer: Self-pay | Admitting: Orthopaedic Surgery

## 2016-12-11 VITALS — BP 138/105 | HR 108 | Temp 98.1°F | Ht 71.0 in | Wt 160.0 lb

## 2016-12-11 DIAGNOSIS — S42001D Fracture of unspecified part of right clavicle, subsequent encounter for fracture with routine healing: Secondary | ICD-10-CM

## 2016-12-11 DIAGNOSIS — F17210 Nicotine dependence, cigarettes, uncomplicated: Secondary | ICD-10-CM | POA: Diagnosis not present

## 2016-12-11 MED ORDER — HYDROCODONE-ACETAMINOPHEN 7.5-325 MG PO TABS: 1.0000 | ORAL_TABLET | Freq: Four times a day (QID) | ORAL | 0 refills | Status: DC | PRN

## 2016-12-11 NOTE — Progress Notes (Signed)
Patient Patrick Lee, male DOB:1961-04-29, 56 y.o. OTL:[id number]  Chief Complaint  Patient presents with  . Follow-up    Right clavicle fracture, new fall 12/06/16    HPI  Patrick Lee is a 56 y.o. male who has had healing fracture of the right distal clavicle from injury 05-10-16.  He fell when his reliner chair tipped over on him and he rehurt the shoulder on the right.  He has more pain.  This happened 12-06-16. HPI  Body mass index is 22.32 kg/m.  ROS  Review of Systems  HENT: Negative for congestion.   Respiratory: Negative for cough and shortness of breath.   Endocrine: Positive for cold intolerance.  Musculoskeletal: Positive for arthralgias.  Allergic/Immunologic: Positive for environmental allergies.    Past Medical History:  Diagnosis Date  . Abdominal pain, other specified site   . Anxiety   . Chest pain, unspecified   . Complication of anesthesia    pt. reports that he had MI while under anesth. for Whipple procedure  . Lateral epicondylitis   . Lung cancer ([place])   . Macrocytic anemia 10/10/2015  . Myocardial infarction 2007   during [place]- 2007  . Obstruction of bile duct   . Pancreatic cancer ([place])     status post Whipple procedure.  . Shortness of breath    uses oxygen at night- 2 liters   . Tobacco use disorder     Past Surgical History:  Procedure Laterality Date  . BONE MARROW ASPIRATION Left 05/25/15  . BONE MARROW BIOPSY Left 05/25/15  . CHOLECYSTECTOMY     open  . COLONOSCOPY  May 2014   Dr. Anthony Sar: normal   . ESOPHAGOGASTRODUODENOSCOPY  Dec 2015   [facility]. normal esophagus, patent afferent and efferent libs, no abnormalities in jejunum.   Marland Kitchen KNEE ARTHROSCOPY     R knee- at [facility]  . partial right lung removal    . Percutaneous extraction of gallstones     2010- [place]  04/08/2012   Procedure: VIDEO BRONCHOSCOPY;  Surgeon: Gaye Pollack, MD;  Location: [place] OR;  Service: Thoracic;   Laterality: N/A;  . WHIPPLE PROCEDURE  2007    Family History  Problem Relation Age of Onset  . Stroke Father 30    Deceased  . Anesthesia problems Neg Hx   . Colon cancer Neg Hx   . Cirrhosis Neg Hx     Social History Social History  Substance Use Topics  . Smoking status: Current Some Day Smoker    Packs/day: 1.00    Years: 28.00    Types: Cigarettes    Last attempt to quit: 01/02/2012  . Smokeless tobacco: Never Used  . Alcohol use No     Comment: last drink was 6 months ago per patient.    No Known Allergies  Current Outpatient Prescriptions  Medication Sig Dispense Refill  . furosemide (LASIX) 40 MG tablet TAKE ONE TABLET BY MOUTH ONCE DAILY 30 tablet 0  . HYDROcodone-acetaminophen (NORCO) 7.5-325 MG tablet Take 1 tablet by mouth every 6 (six) hours as needed for moderate pain (Must last 30 days.Do not drive or operate machinery while taking this medicine.). 110 tablet 0  . naproxen (NAPROSYN) 500 MG tablet Take 1 tablet (500 mg total) by mouth 2 (two) times daily with a meal. 60 tablet 5  . ondansetron (ZOFRAN) 4 MG tablet Take 1 tablet (4 mg total) by mouth 4 (four) times daily -  with meals and at bedtime. 120 tablet 3  . spironolactone (ALDACTONE) 50 MG tablet Take 2 tablets (100 mg total) by mouth daily. (Patient taking differently: Take 50 mg by mouth daily. 30m daily) 100 tablet 3   No current facility-administered medications for this visit.      Physical Exam  Blood pressure (!) 138/105, pulse (!) 108, temperature 98.1 F (36.7 C), height _0  (1.803 m), weight 160 lb (72.6 kg).  Constitutional: overall normal hygiene, normal nutrition, well developed, normal grooming, normal body habitus. Assistive device:none  Musculoskeletal: gait and station Limp none, muscle tone and strength are normal, no tremors or atrophy is present.  .  Neurological: coordination overall normal.  Deep tendon reflex/nerve stretch intact.  Sensation normal.  Cranial nerves  II-XII intact.   Skin:   Normal overall no scars, lesions, ulcers or rashes. No psoriasis.  Psychiatric: Alert and oriented x 3.  Recent memory intact, remote memory unclear.  Normal mood and affect. Well groomed.  Good eye contact.  Cardiovascular: overall no swelling, no varicosities, no edema bilaterally, normal temperatures of the legs and arms, no clubbing, cyanosis and good capillary refill.  Lymphatic: palpation is normal.  He has prominence of the [facility] area of the right shoulder with pain.  ROM of the shoulder is full but very painful.  He has no ecchymosis present.  NV intact.  The patient has been educated about the nature of the problem(s) and counseled on treatment options.  The patient appeared to understand what I have discussed and is in agreement with it.  Encounter Diagnoses  Name Primary?  . Closed displaced fracture of right clavicle with routine healing, unspecified part of clavicle, subsequent encounter Yes  . Cigarette nicotine dependence without complication    He continues to smoke.  PLAN Call if any problems.  Precautions discussed.  Continue current medications.   Return to clinic 1 month   [place] to [place] medicine for this patient. Electronically Signed WSanjuana Kava MD 2/7/20182:20 PM

## 2016-12-11 NOTE — Patient Instructions (Signed)
Steps to Quit Smoking Smoking tobacco can be bad for your health. It can also affect almost every organ in your body. Smoking puts you and people around you at risk for many serious long-lasting (chronic) diseases. Quitting smoking is hard, but it is one of the best things that you can do for your health. It is never too late to quit. What are the benefits of quitting smoking? When you quit smoking, you lower your risk for getting serious diseases and conditions. They can include:  Lung cancer or lung disease.  Heart disease.  Stroke.  Heart attack.  Not being able to have children (infertility).  Weak bones (osteoporosis) and broken bones (fractures). If you have coughing, wheezing, and shortness of breath, those symptoms may get better when you quit. You may also get sick less often. If you are pregnant, quitting smoking can help to lower your chances of having a baby of low birth weight. What can I do to help me quit smoking? Talk with your doctor about what can help you quit smoking. Some things you can do (strategies) include:  Quitting smoking totally, instead of slowly cutting back how much you smoke over a period of time.  Going to in-person counseling. You are more likely to quit if you go to many counseling sessions.  Using resources and support systems, such as:  Online chats with a counselor.  Phone quitlines.  Printed self-help materials.  Support groups or group counseling.  Text messaging programs.  Mobile phone apps or applications.  Taking medicines. Some of these medicines may have nicotine in them. If you are pregnant or breastfeeding, do not take any medicines to quit smoking unless your doctor says it is okay. Talk with your doctor about counseling or other things that can help you. Talk with your doctor about using more than one strategy at the same time, such as taking medicines while you are also going to in-person counseling. This can help make quitting  easier. What things can I do to make it easier to quit? Quitting smoking might feel very hard at first, but there is a lot that you can do to make it easier. Take these steps:  Talk to your family and friends. Ask them to support and encourage you.  Call phone quitlines, reach out to support groups, or work with a counselor.  Ask people who smoke to not smoke around you.  Avoid places that make you want (trigger) to smoke, such as:  Bars.  Parties.  Smoke-break areas at work.  Spend time with people who do not smoke.  Lower the stress in your life. Stress can make you want to smoke. Try these things to help your stress:  Getting regular exercise.  Deep-breathing exercises.  Yoga.  Meditating.  Doing a body scan. To do this, close your eyes, focus on one area of your body at a time from head to toe, and notice which parts of your body are tense. Try to relax the muscles in those areas.  Download or buy apps on your mobile phone or tablet that can help you stick to your quit plan. There are many free apps, such as QuitGuide from the CDC (Centers for Disease Control and Prevention). You can find more support from smokefree.gov and other websites. This information is not intended to replace advice given to you by your health care provider. Make sure you discuss any questions you have with your health care provider. Document Released: 08/17/2009 Document Revised: 06/18/2016 Document   Reviewed: 03/07/2015 Elsevier Interactive Patient Education  2017 Elsevier Inc.  

## 2017-01-14 ENCOUNTER — Ambulatory Visit (INDEPENDENT_AMBULATORY_CARE_PROVIDER_SITE_OTHER): Payer: Medicare Other | Admitting: Orthopaedic Surgery

## 2017-01-14 ENCOUNTER — Ambulatory Visit (INDEPENDENT_AMBULATORY_CARE_PROVIDER_SITE_OTHER): Payer: Medicare Other

## 2017-01-14 VITALS — BP 146/86 | HR 89 | Temp 98.1°F | Ht 71.0 in | Wt 146.0 lb

## 2017-01-14 DIAGNOSIS — F17210 Nicotine dependence, cigarettes, uncomplicated: Secondary | ICD-10-CM | POA: Diagnosis not present

## 2017-01-14 DIAGNOSIS — S42001D Fracture of unspecified part of right clavicle, subsequent encounter for fracture with routine healing: Secondary | ICD-10-CM

## 2017-01-14 MED ORDER — HYDROCODONE-ACETAMINOPHEN 7.5-325 MG PO TABS: 1.0000 | ORAL_TABLET | Freq: Four times a day (QID) | ORAL | 0 refills | Status: DC | PRN

## 2017-01-14 NOTE — Patient Instructions (Signed)
Steps to Quit Smoking Smoking tobacco can be bad for your health. It can also affect almost every organ in your body. Smoking puts you and people around you at risk for many serious Patrick Lee-lasting (chronic) diseases. Quitting smoking is hard, but it is one of the best things that you can do for your health. It is never too late to quit. What are the benefits of quitting smoking? When you quit smoking, you lower your risk for getting serious diseases and conditions. They can include:  Lung cancer or lung disease.  Heart disease.  Stroke.  Heart attack.  Not being able to have children (infertility).  Weak bones (osteoporosis) and broken bones (fractures). If you have coughing, wheezing, and shortness of breath, those symptoms may get better when you quit. You may also get sick less often. If you are pregnant, quitting smoking can help to lower your chances of having a baby of low birth weight. What can I do to help me quit smoking? Talk with your doctor about what can help you quit smoking. Some things you can do (strategies) include:  Quitting smoking totally, instead of slowly cutting back how much you smoke over a period of time.  Going to in-person counseling. You are more likely to quit if you go to many counseling sessions.  Using resources and support systems, such as:  Online chats with a counselor.  Phone quitlines.  Printed self-help materials.  Support groups or group counseling.  Text messaging programs.  Mobile phone apps or applications.  Taking medicines. Some of these medicines may have nicotine in them. If you are pregnant or breastfeeding, do not take any medicines to quit smoking unless your doctor says it is okay. Talk with your doctor about counseling or other things that can help you. Talk with your doctor about using more than one strategy at the same time, such as taking medicines while you are also going to in-person counseling. This can help make quitting  easier. What things can I do to make it easier to quit? Quitting smoking might feel very hard at first, but there is a lot that you can do to make it easier. Take these steps:  Talk to your family and friends. Ask them to support and encourage you.  Call phone quitlines, reach out to support groups, or work with a counselor.  Ask people who smoke to not smoke around you.  Avoid places that make you want (trigger) to smoke, such as:  Bars.  Parties.  Smoke-break areas at work.  Spend time with people who do not smoke.  Lower the stress in your life. Stress can make you want to smoke. Try these things to help your stress:  Getting regular exercise.  Deep-breathing exercises.  Yoga.  Meditating.  Doing a body scan. To do this, close your eyes, focus on one area of your body at a time from head to toe, and notice which parts of your body are tense. Try to relax the muscles in those areas.  Download or buy apps on your mobile phone or tablet that can help you stick to your quit plan. There are many free apps, such as QuitGuide from the CDC (Centers for Disease Control and Prevention). You can find more support from smokefree.gov and other websites. This information is not intended to replace advice given to you by your health care provider. Make sure you discuss any questions you have with your health care provider. Document Released: 08/17/2009 Document Revised: 06/18/2016 Document   Reviewed: 03/07/2015 Elsevier Interactive Patient Education  2017 Elsevier Inc.  

## 2017-01-14 NOTE — Progress Notes (Signed)
CC:  My shoulder still hurts  He has had fracture of the right distal clavicle.  He has pain in the area.  Last month he had a new injury.  He has good motion but it hurts more in the evening and after use.  He has no paresthesias.  He has full motion of the right shoulder with tenderness.  NV intact.  Encounter Diagnoses  Name Primary?  . Closed displaced fracture of right clavicle with routine healing, unspecified part of clavicle, subsequent encounter Yes  . Cigarette nicotine dependence without complication    He continues to smoke.  Return in six weeks.  X-rays show the fracture has healed.  Call if any problem.  I have reviewed the [place] web site prior to prescribing narcotic medicine for this patient.   Electronically Signed Sanjuana Kava, MD 3/13/20183:43 PM

## 2017-02-12 ENCOUNTER — Encounter: Payer: Self-pay | Admitting: Orthopaedic Surgery

## 2017-02-12 ENCOUNTER — Ambulatory Visit (INDEPENDENT_AMBULATORY_CARE_PROVIDER_SITE_OTHER): Payer: Medicare Other | Admitting: Orthopaedic Surgery

## 2017-02-12 VITALS — BP 117/76 | HR 99 | Temp 98.8°F | Ht 71.0 in | Wt 143.0 lb

## 2017-02-12 DIAGNOSIS — F17210 Nicotine dependence, cigarettes, uncomplicated: Secondary | ICD-10-CM

## 2017-02-12 DIAGNOSIS — S42001D Fracture of unspecified part of right clavicle, subsequent encounter for fracture with routine healing: Secondary | ICD-10-CM

## 2017-02-12 MED ORDER — HYDROCODONE-ACETAMINOPHEN 7.5-325 MG PO TABS: 1.0000 | ORAL_TABLET | Freq: Four times a day (QID) | ORAL | 0 refills | Status: DC | PRN

## 2017-02-12 NOTE — Patient Instructions (Signed)
Steps to Quit Smoking Smoking tobacco can be bad for your health. It can also affect almost every organ in your body. Smoking puts you and people around you at risk for many serious long-lasting (chronic) diseases. Quitting smoking is hard, but it is one of the best things that you can do for your health. It is never too late to quit. What are the benefits of quitting smoking? When you quit smoking, you lower your risk for getting serious diseases and conditions. They can include:  Lung cancer or lung disease.  Heart disease.  Stroke.  Heart attack.  Not being able to have children (infertility).  Weak bones (osteoporosis) and broken bones (fractures). If you have coughing, wheezing, and shortness of breath, those symptoms may get better when you quit. You may also get sick less often. If you are pregnant, quitting smoking can help to lower your chances of having a baby of low birth weight. What can I do to help me quit smoking? Talk with your doctor about what can help you quit smoking. Some things you can do (strategies) include:  Quitting smoking totally, instead of slowly cutting back how much you smoke over a period of time.  Going to in-person counseling. You are more likely to quit if you go to many counseling sessions.  Using resources and support systems, such as:  Online chats with a counselor.  Phone quitlines.  Printed self-help materials.  Support groups or group counseling.  Text messaging programs.  Mobile phone apps or applications.  Taking medicines. Some of these medicines may have nicotine in them. If you are pregnant or breastfeeding, do not take any medicines to quit smoking unless your doctor says it is okay. Talk with your doctor about counseling or other things that can help you. Talk with your doctor about using more than one strategy at the same time, such as taking medicines while you are also going to in-person counseling. This can help make quitting  easier. What things can I do to make it easier to quit? Quitting smoking might feel very hard at first, but there is a lot that you can do to make it easier. Take these steps:  Talk to your family and friends. Ask them to support and encourage you.  Call phone quitlines, reach out to support groups, or work with a counselor.  Ask people who smoke to not smoke around you.  Avoid places that make you want (trigger) to smoke, such as:  Bars.  Parties.  Smoke-break areas at work.  Spend time with people who do not smoke.  Lower the stress in your life. Stress can make you want to smoke. Try these things to help your stress:  Getting regular exercise.  Deep-breathing exercises.  Yoga.  Meditating.  Doing a body scan. To do this, close your eyes, focus on one area of your body at a time from head to toe, and notice which parts of your body are tense. Try to relax the muscles in those areas.  Download or buy apps on your mobile phone or tablet that can help you stick to your quit plan. There are many free apps, such as QuitGuide from the CDC (Centers for Disease Control and Prevention). You can find more support from smokefree.gov and other websites. This information is not intended to replace advice given to you by your health care provider. Make sure you discuss any questions you have with your health care provider. Document Released: 08/17/2009 Document Revised: 06/18/2016 Document   Reviewed: 03/07/2015 Elsevier Interactive Patient Education  2017 Elsevier Inc.  

## 2017-02-12 NOTE — Progress Notes (Signed)
Patient Patrick Lee, male DOB:03-09-1961, 56 y.o. YHC:[id number]  Chief Complaint  Patient presents with  . Follow-up    right shoulder pain    HPI  Patrick Lee is a 56 y.o. male who has healed fracture of the right clavicle.  He has some pain with lifting. He has no new trauma. HPI  Body mass index is 19.94 kg/m.  ROS  Review of Systems  HENT: Negative for congestion.   Respiratory: Negative for cough and shortness of breath.   Endocrine: Positive for cold intolerance.  Musculoskeletal: Positive for arthralgias.  Allergic/Immunologic: Positive for environmental allergies.    Past Medical History:  Diagnosis Date  . Abdominal pain, other specified site   . Anxiety   . Chest pain, unspecified   . Complication of anesthesia    pt. reports that he had MI while under anesth. for Whipple procedure  . Lateral epicondylitis   . Lung cancer ([place])   . Macrocytic anemia 10/10/2015  . Myocardial infarction 2007   during [place]- 2007  . Obstruction of bile duct   . Pancreatic cancer ([place])     status post Whipple procedure.  . Shortness of breath    uses oxygen at night- 2 liters   . Tobacco use disorder     Past Surgical History:  Procedure Laterality Date  . BONE MARROW ASPIRATION Left 05/25/15  . BONE MARROW BIOPSY Left 05/25/15  . CHOLECYSTECTOMY     open  . COLONOSCOPY  May 2014   Dr. Anthony Sar: normal   . ESOPHAGOGASTRODUODENOSCOPY  Dec 2015   [facility]. normal esophagus, patent afferent and efferent libs, no abnormalities in jejunum.   Marland Kitchen KNEE ARTHROSCOPY     [facility] at [facility]  . partial right lung removal    . Percutaneous extraction of gallstones     2010- [place]  04/08/2012   Procedure: VIDEO BRONCHOSCOPY;  Surgeon: Patrick Pollack, MD;  Location: [place] OR;  Service: Thoracic;  Laterality: N/A;  . WHIPPLE PROCEDURE  2007    Family History  Problem Relation Age of Onset  . Stroke Father 45    Deceased  .  Anesthesia problems Neg Hx   . Colon cancer Neg Hx   . Cirrhosis Neg Hx     Social History Social History  Substance Use Topics  . Smoking status: Current Some Day Smoker    Packs/day: 1.00    Years: 28.00    Types: Cigarettes    Last attempt to quit: 01/02/2012  . Smokeless tobacco: Never Used  . Alcohol use No     Comment: last drink was 6 months ago per patient.    No Known Allergies  Current Outpatient Prescriptions  Medication Sig Dispense Refill  . furosemide (LASIX) 40 MG tablet TAKE ONE TABLET BY MOUTH ONCE DAILY 30 tablet 0  . HYDROcodone-acetaminophen (NORCO) 7.5-325 MG tablet Take 1 tablet by mouth every 6 (six) hours as needed for moderate pain (Must last 30 days.Do not drive or operate machinery while taking this medicine.). 90 tablet 0  . naproxen (NAPROSYN) 500 MG tablet Take 1 tablet (500 mg total) by mouth 2 (two) times daily with a meal. 60 tablet 5  . ondansetron (ZOFRAN) 4 MG tablet Take 1 tablet (4 mg total) by mouth 4 (four) times daily -  with meals and at bedtime. 120 tablet 3  . spironolactone (ALDACTONE) 50 MG tablet Take 2 tablets (100 mg total) by mouth daily. (  Patient taking differently: Take 50 mg by mouth daily. 76m daily) 100 tablet 3   No current facility-administered medications for this visit.      Physical Exam  Blood pressure 117/76, pulse 99, temperature 98.8 F (37.1 C), height _0  (1.803 m), weight 143 lb (64.9 kg).  Constitutional: overall normal hygiene, normal nutrition, well developed, normal grooming, normal body habitus. Assistive device:none  Musculoskeletal: gait and station Limp none, muscle tone and strength are normal, no tremors or atrophy is present.  .  Neurological: coordination overall normal.  Deep tendon reflex/nerve stretch intact.  Sensation normal.  Cranial nerves II-XII intact.   Skin:   Normal overall no scars, lesions, ulcers or rashes. No psoriasis.  Psychiatric: Alert and oriented x 3.  Recent memory  intact, remote memory unclear.  Normal mood and affect. Well groomed.  Good eye contact.  Cardiovascular: overall no swelling, no varicosities, no edema bilaterally, normal temperatures of the legs and arms, no clubbing, cyanosis and good capillary refill.  Lymphatic: palpation is normal.  Right shoulder has full motion.  NV intact.  He has deformity of distal clavicle from healed fracture.  The patient has been educated about the nature of the problem(s) and counseled on treatment options.  The patient appeared to understand what I have discussed and is in agreement with it.  Encounter Diagnoses  Name Primary?  . Closed displaced fracture of right clavicle with routine healing, unspecified part of clavicle, subsequent encounter Yes  . Cigarette nicotine dependence without complication     PLAN Call if any problems.  Precautions discussed.  Continue current medications.   Return to clinic PRN   Electronically Signed WSanjuana Kava MD 4/11/20183:35 PM

## 2017-03-05 ENCOUNTER — Emergency Department (HOSPITAL_COMMUNITY)
Admission: EM | Admit: 2017-03-05 | Discharge: 2017-03-05 | Disposition: A | Discharge status: Home / Self Care | Payer: Medicare Other | Attending: Emergency Medicine | Admitting: Emergency Medicine

## 2017-03-05 ENCOUNTER — Encounter (HOSPITAL_COMMUNITY): Payer: Self-pay | Admitting: Emergency Medicine

## 2017-03-05 DIAGNOSIS — Z85118 Personal history of other malignant neoplasm of bronchus and lung: Secondary | ICD-10-CM | POA: Insufficient documentation

## 2017-03-05 DIAGNOSIS — Z79899 Other long term (current) drug therapy: Secondary | ICD-10-CM | POA: Diagnosis not present

## 2017-03-05 DIAGNOSIS — K643 Fourth degree hemorrhoids: Secondary | ICD-10-CM | POA: Diagnosis not present

## 2017-03-05 DIAGNOSIS — Z8507 Personal history of malignant neoplasm of pancreas: Secondary | ICD-10-CM | POA: Insufficient documentation

## 2017-03-05 DIAGNOSIS — F17210 Nicotine dependence, cigarettes, uncomplicated: Secondary | ICD-10-CM | POA: Insufficient documentation

## 2017-03-05 DIAGNOSIS — K6289 Other specified diseases of anus and rectum: Secondary | ICD-10-CM | POA: Diagnosis present

## 2017-03-05 DIAGNOSIS — K623 Rectal prolapse: Secondary | ICD-10-CM

## 2017-03-05 MED ORDER — FENTANYL CITRATE (PF) 100 MCG/2ML IJ SOLN
50.0000 ug | Freq: Once | INTRAMUSCULAR | Status: AC
  Administered 2017-03-05: 50 ug via INTRAVENOUS
  Filled 2017-03-05: qty 2

## 2017-03-05 MED ORDER — HYDROCODONE-ACETAMINOPHEN 5-325 MG PO TABS: 1.0000 | ORAL_TABLET | ORAL | 0 refills | Status: AC | PRN

## 2017-03-05 MED ORDER — DIAZEPAM 5 MG/ML IJ SOLN
10.0000 mg | Freq: Once | INTRAMUSCULAR | Status: AC
  Administered 2017-03-05: 10 mg via INTRAVENOUS
  Filled 2017-03-05: qty 2

## 2017-03-05 NOTE — Discharge Instructions (Signed)
Your examination suggest a rectal prolapse, as well as hemorrhoidal issues. Please continue your warm soaks. See Dr L. Marcello Moores at [facility] as soon as possible. Use tylenol for mild pain. Use Norco for more severe pain. This medication may cause drowsiness. Please do not drink, drive, or participate in activity that requires concentration while taking this medication. If you have emergent problems before you appointment with Dr Marcello Moores, please see MD's at the [facility], [place]. The procedure you need is not available at this facility.

## 2017-03-05 NOTE — Consult Note (Signed)
Reason for Consult: Rectal prolapse Referring Physician: Dr. Lacinda Axon, ER  Patrick Lee is an 56 y.o. male.  HPI: Patient is a 56 year old white male who presents with a 12 hour history of worsening rectal pain. He states he feels a fullness in the rectum. He states he was trying suppositories. This was occurring overnight and he presented to the emergency room for further evaluation treatment. He denies rectal bleeding. He denies that this is happening before. Denies constipation. Patient was evaluated by the emergency room and they could not reduce the rectal prolapse.  Past Medical History:  Diagnosis Date  . Abdominal pain, other specified site   . Anxiety   . Chest pain, unspecified   . Complication of anesthesia    pt. reports that he had MI while under anesth. for Whipple procedure  . Lateral epicondylitis   . Lung cancer ([place])   . Macrocytic anemia 10/10/2015  . Myocardial infarction [facility]) 2007   during [place]- 2007  . Obstruction of bile duct   . Pancreatic cancer ([place])     status post Whipple procedure.  . Shortness of breath    uses oxygen at night- 2 liters   . Tobacco use disorder     Past Surgical History:  Procedure Laterality Date  . BONE MARROW ASPIRATION Left 05/25/15  . BONE MARROW BIOPSY Left 05/25/15  . CHOLECYSTECTOMY     open  . COLONOSCOPY  May 2014   Dr. Anthony Sar: normal   . ESOPHAGOGASTRODUODENOSCOPY  Dec 2015   [facility]. normal esophagus, patent afferent and efferent libs, no abnormalities in jejunum.   Marland Kitchen KNEE ARTHROSCOPY     R knee- at [facility]  . partial right lung removal    . Percutaneous extraction of gallstones     2010- [place]  04/08/2012   Procedure: VIDEO BRONCHOSCOPY;  Surgeon: Gaye Pollack, MD;  Location: [place] OR;  Service: Thoracic;  Laterality: N/A;  . WHIPPLE PROCEDURE  2007    Family History  Problem Relation Age of Onset  . Stroke Father 78    Deceased  . Anesthesia problems Neg Hx    . Colon cancer Neg Hx   . Cirrhosis Neg Hx     Social History:  reports that he has been smoking Cigarettes.  He has a 28.00 pack-year smoking history. He has never used smokeless tobacco. He reports that he does not drink alcohol or use drugs.  Allergies: No Known Allergies  Medications: Prior to Admission:  (Not in a hospital admission) Scheduled:  No results found for this or any previous visit (from the past 48 hour(s)).  No results found.  ROS:  Pertinent items noted in HPI and remainder of comprehensive ROS otherwise negative.  Blood pressure 128/72, pulse 73, temperature 97.4 F (36.3 C), temperature source Oral, resp. rate 18, SpO2 96 %. Physical Exam: Well-developed well-nourished white male who is anxious Rectal examination reveals external hemorrhoidal tissue with rectal prolapse circumferentially. Friable mucosa is noted. The prolapsed tissue was reduced digitally. Hemorrhoidal tissue was still present, but not necrotic. No bleeding was noted. His pain was somewhat reduced with the reduction of the prolapse.  Assessment/Plan: Impression: Rectal prolapse, reduced.  Extensive hemorrhoidal disease Plan: Patient needs to be referred to Dr. Marcello Moores, colorectal surgery at [facility] in [place] for further evaluation and treatment as rectal prolapse surgery is not performed here at [place] 03/05/2017, 10:44 AM

## 2017-03-05 NOTE — ED Provider Notes (Signed)
[facility] DEPT Provider Note   CSN: [id number] Arrival date & time: 03/05/17  9563     History   Chief Complaint Chief Complaint  Patient presents with  . Hemorrhoids    HPI Patrick Lee is a 56 y.o. male.  Patient is a 56 year old male who presents to the emergency department with complaint of severe rectal area pain.  The patient states that he has had problems with hemorrhoids off and on for some time. Within the last 2 days he's been having increasing anal area pain and swelling. He states that he has tried warm soaks, he is also tried preparation H. He states that these are not helping and his pain is getting worse. He was concerned because he saw some dark red blood with his stool on this early a.m. He states that he has to stand most of the time because the pain of sitting is just too severe. He has tried warm soaks and Preparation H, but these are not helping.      Past Medical History:  Diagnosis Date  . Abdominal pain, other specified site   . Anxiety   . Chest pain, unspecified   . Complication of anesthesia    pt. reports that he had MI while under anesth. for Whipple procedure  . Lateral epicondylitis   . Lung cancer ([place])   . Macrocytic anemia 10/10/2015  . Myocardial infarction [facility]) 2007   during [place]- 2007  . Obstruction of bile duct   . Pancreatic cancer ([place])     status post Whipple procedure.  . Shortness of breath    uses oxygen at night- 2 liters   . Tobacco use disorder     Patient Active Problem List   Diagnosis Date Noted  . Macrocytic anemia 10/10/2015  . Cirrhosis of liver with ascites ([place]) 09/12/2015  . Other cirrhosis of liver ([place])   . Hepatic cirrhosis ([place]) 06/20/2015  . RUQ pain 05/12/2015  . Malnutrition of moderate degree ([place]) 04/23/2015  . Anasarca 04/14/2015  . Acute deep vein thrombosis of both lower extremities ([place]) 03/19/2015  . Acute pulmonary embolism ([place]) 03/19/2015  . Long term  current use of anticoagulant therapy 03/19/2015  . Acute on chronic respiratory failure with hypoxia ([place]) 03/16/2015  . H/O pancreatic cancer 04/19/2013  . Former smoker 04/19/2013  . Abnormal transaminases 04/19/2013  . H/O: lung cancer 08/11/2012  . Cardiomyopathy ([place]) 04/17/2011  . Preoperative cardiovascular examination 04/17/2011  . Dyspnea 03/26/2011  . Chest pain, unspecified   . Tobacco use disorder     Past Surgical History:  Procedure Laterality Date  . BONE MARROW ASPIRATION Left 05/25/15  . BONE MARROW BIOPSY Left 05/25/15  . CHOLECYSTECTOMY     open  . COLONOSCOPY  May 2014   Dr. Anthony Sar: normal   . ESOPHAGOGASTRODUODENOSCOPY  Dec 2015   [facility]. normal esophagus, patent afferent and efferent libs, no abnormalities in jejunum.   Marland Kitchen KNEE ARTHROSCOPY     R knee- at [facility]  . partial right lung removal    . Percutaneous extraction of gallstones     2010- [place]  04/08/2012   Procedure: VIDEO BRONCHOSCOPY;  Surgeon: Gaye Pollack, MD;  Location: [facility];  Service: Thoracic;  Laterality: N/A;  . WHIPPLE PROCEDURE  2007       Home Medications    Prior to Admission medications   Medication Sig Start Date End Date Taking? Authorizing Provider  furosemide (LASIX)  40 MG tablet TAKE ONE TABLET BY MOUTH ONCE DAILY 03/20/16   Butch Penny, NP  HYDROcodone-acetaminophen (NORCO) 7.5-325 MG tablet Take 1 tablet by mouth every 6 (six) hours as needed for moderate pain (Must last 30 days.Do not drive or operate machinery while taking this medicine.). 02/12/17   Sanjuana Kava, MD  naproxen (NAPROSYN) 500 MG tablet Take 1 tablet (500 mg total) by mouth 2 (two) times daily with a meal. 07/09/16   Sanjuana Kava, MD  ondansetron (ZOFRAN) 4 MG tablet Take 1 tablet (4 mg total) by mouth 4 (four) times daily -  with meals and at bedtime. 06/20/15   Annitta Needs, NP  spironolactone (ALDACTONE) 50 MG tablet Take 2 tablets (100 mg total) by mouth daily. Patient  taking differently: Take 50 mg by mouth daily. 45m daily 08/10/15   TButch Penny NP    Family History Family History  Problem Relation Age of Onset  . Stroke Father 665   Deceased  . Anesthesia problems Neg Hx   . Colon cancer Neg Hx   . Cirrhosis Neg Hx     Social History Social History  Substance Use Topics  . Smoking status: Current Some Day Smoker    Packs/day: 1.00    Years: 28.00    Types: Cigarettes    Last attempt to quit: 01/02/2012  . Smokeless tobacco: Never Used  . Alcohol use No     Comment: last drink was 6 months ago per patient.     Allergies   Patient has no known allergies.   Review of Systems Review of Systems  Genitourinary:       Rectal pain  All other systems reviewed and are negative.    Physical Exam Updated Vital Signs BP 128/72   Pulse 73   Temp 97.4 F (36.3 C) (Oral)   Resp 18   SpO2 96%   Physical Exam  Constitutional: He is oriented to person, place, and time. He appears well-developed and well-nourished.  Non-toxic appearance.  HENT:  Head: Normocephalic.  Right Ear: Tympanic membrane and external ear normal.  Left Ear: Tympanic membrane and external ear normal.  Eyes: EOM and lids are normal. Pupils are equal, round, and reactive to light.  Neck: Normal range of motion. Neck supple. Carotid bruit is not present.  Cardiovascular: Normal rate, regular rhythm, normal heart sounds, intact distal pulses and normal pulses.   Pulmonary/Chest: Breath sounds normal. No respiratory distress.  Abdominal: Soft. Bowel sounds are normal. There is no tenderness. There is no guarding.  Genitourinary: Rectal exam shows external hemorrhoid and tenderness.  Genitourinary Comments: Mucosal rectal prolapse noted. No discoloration or signs of ischemia.  Musculoskeletal: Normal range of motion.  Lymphadenopathy:       Head (right side): No submandibular adenopathy present.       Head (left side): No submandibular adenopathy present.    He  has no cervical adenopathy.  Neurological: He is alert and oriented to person, place, and time. He has normal strength. No cranial nerve deficit or sensory deficit.  Skin: Skin is warm and dry.  Psychiatric: He has a normal mood and affect. His speech is normal.  Nursing note and vitals reviewed.    ED Treatments / Results  Labs (all labs ordered are listed, but only abnormal results are displayed) Labs Reviewed - No data to display  EKG  EKG Interpretation None       Radiology No results found.  Procedures Procedures (including critical care  time) RECTAL PROLAPSE REDUCTION. On examination the patient is noted to have rectal prolapse as well as significant external hemorrhoid issues. I explained to the patient that we should attempt to reduce the prolapse to prevent further issues. I discussed the procedure with patient in terms which he understands. The patient gives permission for the procedure.  The patient was identified by arm band. Procedural time out taken. The patient was given intravenous Valium and fentanyl. After he was relaxed and much more comfortable. Attempts were made to gently and gradually reduce the rectal prolapse. A portion of the prolapse was reduced. The patient states that he could not take the pain and the procedure was called stat.  After a few months the procedure was started again on, but the patient stated again that the pain was too severe and he could not take it. The procedure was abandoned at this point. The patient tolerated the abandoned procedure with some pain, but no acute issues or problems. Vital signs are within normal limits. Pulse oximetry is in normal limits by my interpretation. Medications Ordered in ED Medications  fentaNYL (SUBLIMAZE) injection 50 mcg (50 mcg Intravenous Given 03/05/17 0913)  diazepam (VALIUM) injection 10 mg (10 mg Intravenous Given 03/05/17 0914)     Initial Impression / Assessment and Plan / ED Course  I have  reviewed the triage vital signs and the nursing notes.  Pertinent labs & imaging results that were available during my care of the patient were reviewed by me and considered in my medical decision making (see chart for details).       Final Clinical Impressions(s) / ED Diagnoses MDM Vital signs reviewed. Pulse oximetry is 96% on room air. Patient appears quite uncomfortable with his rectal pain. Examination reveals both rectal prolapse, as well as external hemorrhoidal issues. An attempt was made to reduce the prolapsed rectum. A portion of it was reduced, but the patient could not tolerate the remainder of the procedure and it was abandoned.  I discussed the case with Dr. Orlene Plum. surgery. Dr. Arnoldo Morale came to the emergency department he was able to reduce rectal prolapse area. There remain external hemorrhoidal issues.  The patient will be referred to Dr. Ned Card with [facility] surgery. Patient is been made aware that the procedure that he needs done is not available at this facility. The patient is advised to continue his soaks. He is given a prescription of 12 tablets of Norco for pain until he is seen by [facility] surgery. Patient knowledge is understanding of the discharge instructions.    Final diagnoses:  Partial rectal prolapse  Grade IV hemorrhoids    New Prescriptions Discharge Medication List as of 03/05/2017 10:54 AM    START taking these medications   Details  HYDROcodone-acetaminophen (NORCO/VICODIN) 5-325 MG tablet Take 1-2 tablets by mouth every 4 (four) hours as needed., Starting Wed 03/05/2017, Print         Lily Kocher, PA-C 03/05/17 1115    Nat Christen, MD 03/08/17 [phone]

## 2017-03-05 NOTE — ED Triage Notes (Signed)
Pt has hx of hemorrhoids. c/o anal pain x 2 days with dark red blood. lnbm this am. Pt grimacing and standing for comfort.

## 2017-09-04 DEATH — deceased

## 2019-03-02 NOTE — Progress Notes (Signed)
REVIEWED-NO ADDITIONAL RECOMMENDATIONS.
